# Patient Record
Sex: Male | Born: 1945 | Race: White | Hispanic: No | Marital: Single | Smoking: Never smoker
Health system: Southern US, Community
[De-identification: ages and names within clinical notes are randomized; demographics above are authoritative.]

## PROBLEM LIST (undated history)

## (undated) DIAGNOSIS — E7800 Pure hypercholesterolemia, unspecified: Secondary | ICD-10-CM

## (undated) DIAGNOSIS — E119 Type 2 diabetes mellitus without complications: Secondary | ICD-10-CM

## (undated) DIAGNOSIS — I82629 Acute embolism and thrombosis of deep veins of unspecified upper extremity: Secondary | ICD-10-CM

## (undated) DIAGNOSIS — F32A Depression, unspecified: Secondary | ICD-10-CM

## (undated) DIAGNOSIS — F329 Major depressive disorder, single episode, unspecified: Secondary | ICD-10-CM

## (undated) DIAGNOSIS — D649 Anemia, unspecified: Secondary | ICD-10-CM

## (undated) DIAGNOSIS — I2510 Atherosclerotic heart disease of native coronary artery without angina pectoris: Secondary | ICD-10-CM

## (undated) DIAGNOSIS — I10 Essential (primary) hypertension: Secondary | ICD-10-CM

## (undated) DIAGNOSIS — N400 Enlarged prostate without lower urinary tract symptoms: Secondary | ICD-10-CM

## (undated) DIAGNOSIS — G40909 Epilepsy, unspecified, not intractable, without status epilepticus: Secondary | ICD-10-CM

## (undated) DIAGNOSIS — C61 Malignant neoplasm of prostate: Secondary | ICD-10-CM

## (undated) DIAGNOSIS — C9200 Acute myeloblastic leukemia, not having achieved remission: Secondary | ICD-10-CM

## (undated) DIAGNOSIS — F419 Anxiety disorder, unspecified: Secondary | ICD-10-CM

## (undated) DIAGNOSIS — K219 Gastro-esophageal reflux disease without esophagitis: Secondary | ICD-10-CM

## (undated) HISTORY — DX: Anemia, unspecified: D64.9

## (undated) HISTORY — DX: Acute embolism and thrombosis of deep veins of unspecified upper extremity: I82.629

## (undated) HISTORY — DX: Atherosclerotic heart disease of native coronary artery without angina pectoris: I25.10

## (undated) HISTORY — DX: Gastro-esophageal reflux disease without esophagitis: K21.9

## (undated) HISTORY — DX: Benign prostatic hyperplasia without lower urinary tract symptoms: N40.0

## (undated) HISTORY — DX: Acute myeloblastic leukemia, not having achieved remission: C92.00

## (undated) HISTORY — PX: CARDIAC SURGERY: SHX584

## (undated) HISTORY — DX: Depression, unspecified: F32.A

---
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
## 2002-06-19 DIAGNOSIS — C61 Malignant neoplasm of prostate: Secondary | ICD-10-CM

## 2002-06-19 HISTORY — DX: Malignant neoplasm of prostate: C61

## 2004-08-09 ENCOUNTER — Encounter: Payer: Self-pay | Admitting: Thoracic Surgery

## 2004-08-17 ENCOUNTER — Encounter: Payer: Self-pay | Admitting: Thoracic Surgery

## 2004-09-17 ENCOUNTER — Encounter: Payer: Self-pay | Admitting: Thoracic Surgery

## 2004-10-17 ENCOUNTER — Encounter: Payer: Self-pay | Admitting: Thoracic Surgery

## 2009-04-09 ENCOUNTER — Emergency Department: Payer: Self-pay | Admitting: Unknown Physician Specialty

## 2009-06-17 ENCOUNTER — Encounter: Payer: Self-pay | Admitting: Orthopedic Surgery

## 2009-06-19 ENCOUNTER — Encounter: Payer: Self-pay | Admitting: Orthopedic Surgery

## 2009-07-20 ENCOUNTER — Encounter: Payer: Self-pay | Admitting: Orthopedic Surgery

## 2010-03-23 ENCOUNTER — Emergency Department: Payer: Self-pay | Admitting: Internal Medicine

## 2015-03-30 DIAGNOSIS — R053 Chronic cough: Secondary | ICD-10-CM | POA: Insufficient documentation

## 2015-09-24 DIAGNOSIS — R079 Chest pain, unspecified: Secondary | ICD-10-CM | POA: Insufficient documentation

## 2015-09-25 DIAGNOSIS — M546 Pain in thoracic spine: Secondary | ICD-10-CM | POA: Insufficient documentation

## 2016-01-19 DIAGNOSIS — H1013 Acute atopic conjunctivitis, bilateral: Secondary | ICD-10-CM | POA: Insufficient documentation

## 2016-03-27 DIAGNOSIS — C4491 Basal cell carcinoma of skin, unspecified: Secondary | ICD-10-CM | POA: Insufficient documentation

## 2016-10-03 ENCOUNTER — Encounter: Payer: Self-pay | Admitting: Emergency Medicine

## 2016-10-03 ENCOUNTER — Emergency Department: Payer: Medicare Other

## 2016-10-03 ENCOUNTER — Emergency Department
Admission: EM | Admit: 2016-10-03 | Discharge: 2016-10-03 | Disposition: A | Discharge status: Home / Self Care | Payer: Medicare Other | Attending: Emergency Medicine | Admitting: Emergency Medicine

## 2016-10-03 DIAGNOSIS — W19XXXA Unspecified fall, initial encounter: Secondary | ICD-10-CM

## 2016-10-03 DIAGNOSIS — R51 Headache: Secondary | ICD-10-CM | POA: Diagnosis not present

## 2016-10-03 DIAGNOSIS — W1839XA Other fall on same level, initial encounter: Secondary | ICD-10-CM | POA: Insufficient documentation

## 2016-10-03 DIAGNOSIS — S0990XA Unspecified injury of head, initial encounter: Secondary | ICD-10-CM | POA: Diagnosis present

## 2016-10-03 DIAGNOSIS — Y9389 Activity, other specified: Secondary | ICD-10-CM | POA: Insufficient documentation

## 2016-10-03 DIAGNOSIS — Y999 Unspecified external cause status: Secondary | ICD-10-CM | POA: Insufficient documentation

## 2016-10-03 DIAGNOSIS — I10 Essential (primary) hypertension: Secondary | ICD-10-CM | POA: Insufficient documentation

## 2016-10-03 DIAGNOSIS — Y929 Unspecified place or not applicable: Secondary | ICD-10-CM | POA: Diagnosis not present

## 2016-10-03 DIAGNOSIS — E119 Type 2 diabetes mellitus without complications: Secondary | ICD-10-CM | POA: Diagnosis not present

## 2016-10-03 DIAGNOSIS — M542 Cervicalgia: Secondary | ICD-10-CM | POA: Insufficient documentation

## 2016-10-03 HISTORY — DX: Anxiety disorder, unspecified: F41.9

## 2016-10-03 HISTORY — DX: Type 2 diabetes mellitus without complications: E11.9

## 2016-10-03 HISTORY — DX: Essential (primary) hypertension: I10

## 2016-10-03 HISTORY — DX: Pure hypercholesterolemia, unspecified: E78.00

## 2016-10-03 NOTE — ED Provider Notes (Signed)
[facility] Emergency Department Provider Note  ____________________________________________  Time seen: Approximately 10:59 AM  I have reviewed the triage vital signs and the nursing notes.   HISTORY  Chief Complaint Fall    HPI Mike [name] is a 71 y.o. male presenting to the emergency department with 3 out of 10 frontal headache and neck pain after falling while picking up a newspaper 3 days ago. Patient states that he leaned forward to pick up a newspaper and fell. Patient was unable to get up without the assistance of his sister. Patient denies losing consciousness. He denies blurry vision, nausea, vomiting and confusion. Patient states that neck pain has progressively worsened over the last few days. He states that he has weakness of the left upper extremity from a prior injury years ago. Patient denies radiculopathy and bowel and bladder incontinence. Patient denies a history of blood thinners or stroke. Patient denies fever or recent illness. Patient has tried Tylenol with codeine but has attempted no other alleviating measures.   Past Medical History:  Diagnosis Date  . Anxiety   . Diabetes mellitus without complication ([place])   . High cholesterol   . Hypertension     There are no active problems to display for this patient.   History reviewed. No pertinent surgical history.  Prior to Admission medications   Not on File    Allergies Aleve [naproxen]  History reviewed. No pertinent family history.  Social History Social History  Substance Use Topics  . Smoking status: Never Smoker  . Smokeless tobacco: Never Used  . Alcohol use No     Review of Systems  Constitutional: No fever/chills Eyes: No visual changes. No discharge ENT: No upper respiratory complaints. Cardiovascular: no chest pain. Respiratory: no cough. No SOB. Gastrointestinal: No abdominal pain.  No nausea, no vomiting.  No diarrhea.  No constipation. Genitourinary:  Negative for dysuria. No hematuria Musculoskeletal: Patient has neck pain.  Skin: Negative for rash, abrasions, lacerations, ecchymosis. Neurological: Negative for headaches.   ____________________________________________   PHYSICAL EXAM:  VITAL SIGNS: ED Triage Vitals  Enc Vitals Group     BP 10/03/16 0916 (!) 179/80     Pulse Rate 10/03/16 0916 (!) 58     Resp 10/03/16 0916 18     Temp 10/03/16 0916 97.9 F (36.6 C)     Temp Source 10/03/16 0916 Oral     SpO2 10/03/16 0916 97 %     Weight 10/03/16 0918 190 lb (86.2 kg)     Height 10/03/16 0918 5\' 7"  (1.702 m)     Head Circumference --      Peak Flow --      Pain Score 10/03/16 0922 3     Pain Loc --      Pain Edu? --      Excl. in [place]? --    Constitutional: Alert and oriented. 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 Patient holds his left upper extremity in flexion at the elbow and wrist against his abdomen at baseline. Eyes: Palpebral and bulbar conjunctiva are nonerythematous bilaterally. PERRL. EOMI.  Head: Atraumatic. ENT:      Ears: Tympanic membranes are pearly bilaterally without bloody effusion visualized.       Nose: Nasal septum is midline without evidence of blood or septal hematoma.      Mouth/Throat: Mucous membranes are moist. Uvula is midline. Neck: Full range of motion. No pain with neck flexion. Patient has pain with palpation of the  cervical spine.   Cardiovascular:  Normal rate, regular rhythm. Normal S1 and S2. No murmurs, gallops or rubs auscultated.  Respiratory: Resonant and symmetric percussion tones bilaterally. On auscultation, adventitious sounds are absent.  Gastrointestinal: No areas of visible pulsations or peristalsis. Active bowel sounds audible in all four quadrants.Musculature soft and relaxed to light palpation. No masses or areas of tenderness to deep palpation. No costovertebral angle tenderness bilaterally.  Musculoskeletal: Patient has 5/5 strength in the upper and  lower extremities bilaterally. Full range of motion at the shoulder, elbow and wrist bilaterally. Full range of motion at the hip, knee and ankle bilaterally. No changes in gait. Palpable radial, ulnar and dorsalis pedis pulses bilaterally and symmetrically. Neurologic: Normal speech and language. No gross focal neurologic deficits are appreciated. Cranial nerves: 2-10 normal as tested. Cerebellar: Finger-nose-finger WNL, heel to shin WNL. Sensorimotor: No sensory loss or abnormal reflexes. Vision: No visual field deficts noted to confrontation.  Speech: No dysarthria or expressive aphasia.  Skin:  Skin is warm, dry and intact. No rash or bruising noted.  Psychiatric: Mood and affect are normal for age. Speech and behavior are normal.  ____________________________________________   LABS (all labs ordered are listed, but only abnormal results are displayed)  Labs Reviewed - No data to display ____________________________________________  EKG   ____________________________________________  RADIOLOGY Unk Pinto, personally viewed and evaluated these images as part of my medical decision making, as well as reviewing the written report by the radiologist.    Ct Head Wo Contrast  Result Date: 10/03/2016 CLINICAL DATA:  Fall several days ago with headaches and neck pain, initial encounter EXAM: CT HEAD WITHOUT CONTRAST CT CERVICAL SPINE WITHOUT CONTRAST TECHNIQUE: Multidetector CT imaging of the head and cervical spine was performed following the standard protocol without intravenous contrast. Multiplanar CT image reconstructions of the cervical spine were also generated. COMPARISON:  None. FINDINGS: CT HEAD FINDINGS Brain: Mild atrophic changes are noted. No findings to suggest acute hemorrhage, acute infarction or space-occupying mass lesion are noted. Vascular: No hyperdense vessel or unexpected calcification. Skull: Normal. Negative for fracture or focal lesion. Sinuses/Orbits: No  acute finding. Other: None. CT CERVICAL SPINE FINDINGS Alignment: Within normal limits. Skull base and vertebrae: 7 cervical segments are well visualized. Multilevel osteophytic changes and facet hypertrophic changes are seen. No acute fracture or acute facet abnormality is noted. Ligamentous calcification is noted posteriorly. Soft tissues and spinal canal: Within normal limits. Disc levels: Multilevel osteophytic changes are noted. These are worst at the C5-6 level. Upper chest: Within normal limits. IMPRESSION: CT of the head:  No acute intracranial abnormality noted. CT of the cervical spine: Chronic degenerative changes without acute abnormality. Electronically Signed   By: Inez Catalina M.D.   On: 10/03/2016 11:36   Ct Cervical Spine Wo Contrast  Result Date: 10/03/2016 CLINICAL DATA:  Fall several days ago with headaches and neck pain, initial encounter EXAM: CT HEAD WITHOUT CONTRAST CT CERVICAL SPINE WITHOUT CONTRAST TECHNIQUE: Multidetector CT imaging of the head and cervical spine was performed following the standard protocol without intravenous contrast. Multiplanar CT image reconstructions of the cervical spine were also generated. COMPARISON:  None. FINDINGS: CT HEAD FINDINGS Brain: Mild atrophic changes are noted. No findings to suggest acute hemorrhage, acute infarction or space-occupying mass lesion are noted. Vascular: No hyperdense vessel or unexpected calcification. Skull: Normal. Negative for fracture or focal lesion. Sinuses/Orbits: No acute finding. Other: None. CT CERVICAL SPINE FINDINGS Alignment: Within normal limits. Skull base and vertebrae: 7 cervical  segments are well visualized. Multilevel osteophytic changes and facet hypertrophic changes are seen. No acute fracture or acute facet abnormality is noted. Ligamentous calcification is noted posteriorly. Soft tissues and spinal canal: Within normal limits. Disc levels: Multilevel osteophytic changes are noted. These are worst at the C5-6  level. Upper chest: Within normal limits. IMPRESSION: CT of the head:  No acute intracranial abnormality noted. CT of the cervical spine: Chronic degenerative changes without acute abnormality. Electronically Signed   By: Inez Catalina M.D.   On: 10/03/2016 11:36    ____________________________________________    PROCEDURES  Procedure(s) performed:    Procedures    Medications - No data to display   ____________________________________________   INITIAL IMPRESSION / ASSESSMENT AND PLAN / ED COURSE  Pertinent labs & imaging results that were available during my care of the patient were reviewed by me and considered in my medical decision making (see chart for details).  Review of the [place] CSRS was performed in accordance of the [place] prior to dispensing any controlled drugs.     Assessment and Plan:  Fall, initial encounter:  Patient presents to the emergency department with neck pain and frontal headache after falling while picking up a newspaper 3 days ago. CT head and CT cervical spine revealed no acute abnormality. Physical exam and vital signs are reassuring. Patient was advised to use Tylenol as needed for discomfort. All patient questions were answered.   ____________________________________________  FINAL CLINICAL IMPRESSION(S) / ED DIAGNOSES  Final diagnoses:  Fall, initial encounter      NEW MEDICATIONS STARTED DURING THIS VISIT:  There are no discharge medications for this patient.       This chart was dictated using voice recognition software/Dragon. Despite best efforts to proofread, errors can occur which can change the meaning. Any change was purely unintentional.    Lannie Fields, PA-C 10/03/16 2125    Nena Polio, MD 10/04/16 [phone]

## 2016-10-03 NOTE — ED Triage Notes (Signed)
Pt to ed post fall on Saturday.  Pt states he bent over to pick up the newspaper and fell forward onto the ground after he lost his balance.  Pt states he was supposed to have a cane but was not using it on Saturday.  Pt reports headache today 3/10.

## 2018-01-07 ENCOUNTER — Other Ambulatory Visit: Payer: Self-pay

## 2018-01-07 ENCOUNTER — Emergency Department: Payer: Medicare Other

## 2018-01-07 ENCOUNTER — Emergency Department
Admission: EM | Admit: 2018-01-07 | Discharge: 2018-01-07 | Disposition: A | Discharge status: Home / Self Care | Payer: Medicare Other | Attending: Emergency Medicine | Admitting: Emergency Medicine

## 2018-01-07 DIAGNOSIS — R55 Syncope and collapse: Secondary | ICD-10-CM | POA: Insufficient documentation

## 2018-01-07 DIAGNOSIS — I259 Chronic ischemic heart disease, unspecified: Secondary | ICD-10-CM | POA: Diagnosis not present

## 2018-01-07 DIAGNOSIS — I10 Essential (primary) hypertension: Secondary | ICD-10-CM | POA: Insufficient documentation

## 2018-01-07 DIAGNOSIS — E119 Type 2 diabetes mellitus without complications: Secondary | ICD-10-CM | POA: Diagnosis not present

## 2018-01-07 DIAGNOSIS — R0602 Shortness of breath: Secondary | ICD-10-CM | POA: Diagnosis present

## 2018-01-07 LAB — CBC WITH DIFFERENTIAL/PLATELET
Basophils Absolute: 0.1 10*3/uL (ref 0–0.1)
Basophils Relative: 1 %
Eosinophils Absolute: 0.3 10*3/uL (ref 0–0.7)
Eosinophils Relative: 3 %
HEMATOCRIT: 35.1 % — AB (ref 40.0–52.0)
Hemoglobin: 11.2 g/dL — ABNORMAL LOW (ref 13.0–18.0)
LYMPHS ABS: 1.3 10*3/uL (ref 1.0–3.6)
LYMPHS PCT: 11 %
MCH: 19.8 pg — ABNORMAL LOW (ref 26.0–34.0)
MCHC: 31.8 g/dL — AB (ref 32.0–36.0)
MCV: 62.4 fL — AB (ref 80.0–100.0)
MONO ABS: 1.1 10*3/uL — AB (ref 0.2–1.0)
MONOS PCT: 10 %
NEUTROS ABS: 8.6 10*3/uL — AB (ref 1.4–6.5)
Neutrophils Relative %: 75 %
Platelets: 210 10*3/uL (ref 150–440)
RBC: 5.63 MIL/uL (ref 4.40–5.90)
RDW: 15.1 % — AB (ref 11.5–14.5)
WBC: 11.4 10*3/uL — ABNORMAL HIGH (ref 3.8–10.6)

## 2018-01-07 LAB — TROPONIN I: Troponin I: <0.03 ng/mL (ref <0.03)

## 2018-01-07 LAB — URINALYSIS, COMPLETE (UACMP) WITH MICROSCOPIC
BACTERIA UA: NONE SEEN
Bilirubin Urine: NEGATIVE
Glucose, UA: >=500 mg/dL — AB
Hgb urine dipstick: NEGATIVE
KETONES UR: NEGATIVE mg/dL
Leukocytes, UA: NEGATIVE
NITRITE: NEGATIVE
PH: 8 (ref 5.0–8.0)
Protein, ur: NEGATIVE mg/dL
SPECIFIC GRAVITY, URINE: 1.006 (ref 1.005–1.030)
SQUAMOUS EPITHELIAL / LPF: NONE SEEN (ref 0–5)

## 2018-01-07 LAB — COMPREHENSIVE METABOLIC PANEL
ALT: 16 U/L (ref 0–44)
ANION GAP: 9 (ref 5–15)
AST: 22 U/L (ref 15–41)
Albumin: 4.3 g/dL (ref 3.5–5.0)
Alkaline Phosphatase: 165 U/L — ABNORMAL HIGH (ref 38–126)
BUN: 18 mg/dL (ref 8–23)
CO2: 27 mmol/L (ref 22–32)
Calcium: 9.3 mg/dL (ref 8.9–10.3)
Chloride: 99 mmol/L (ref 98–111)
Creatinine, Ser: 1.19 mg/dL (ref 0.61–1.24)
GFR calc Af Amer: >60 mL/min (ref >60)
GFR calc non Af Amer: 59 mL/min — ABNORMAL LOW (ref >60)
GLUCOSE: 280 mg/dL — AB (ref 70–99)
POTASSIUM: 4.8 mmol/L (ref 3.5–5.1)
SODIUM: 135 mmol/L (ref 135–145)
Total Bilirubin: 0.7 mg/dL (ref 0.3–1.2)
Total Protein: 7.6 g/dL (ref 6.5–8.1)

## 2018-01-07 MED ORDER — SODIUM CHLORIDE 0.9 % IV BOLUS
500.0000 mL | Freq: Once | INTRAVENOUS | Status: AC
  Administered 2018-01-07: 500 mL via INTRAVENOUS

## 2018-01-07 NOTE — ED Triage Notes (Signed)
Pt comes via [facility] from home with c/o SHOB. Pt states he was walking to the mailbox and got SHOB and diaphoretic. Pt states he started back to the house and had trouble. Pt is alert and oriented. Pt had recent MI hx and wanted to get checked out.

## 2018-01-07 NOTE — ED Notes (Signed)
Pt given urinal for urine collection

## 2018-01-07 NOTE — ED Provider Notes (Signed)
[facility] Emergency Department Provider Note ____________________________________________   First MD Initiated Contact with Patient 01/07/18 1513     (approximate)  I have reviewed the triage vital signs and the nursing notes.   HISTORY  Chief Complaint Shortness of Breath    HPI Mike [name] is a 72 y.o. male with PMH as noted below who presents with dizziness, described as being "swimmy headed" acute onset this afternoon when the patient walked outside to check his mail.  He states that he also felt short of breath and had some chest discomfort.  He states that the symptoms have now mostly resolved.  He reports being in his usual state of health until this afternoon.  He denies any other acute symptoms.   Past Medical History:  Diagnosis Date  . Anxiety   . Diabetes mellitus without complication ([place])   . High cholesterol   . Hypertension     There are no active problems to display for this patient.   History reviewed. No pertinent surgical history.  Prior to Admission medications   Not on File    Allergies Aleve [naproxen]  No family history on file.  Social History Social History   Tobacco Use  . Smoking status: Never Smoker  . Smokeless tobacco: Never Used  Substance Use Topics  . Alcohol use: No  . Drug use: No    Review of Systems  Constitutional: No fever. Eyes: No visual changes. ENT: No sore throat. Cardiovascular: Positive for chest discomfort. Respiratory: Positive for shortness of breath. Gastrointestinal: No vomiting or diarrhea. Genitourinary: Negative for dysuria.  Musculoskeletal: Negative for back pain. Skin: Negative for rash. Neurological: Negative for headache.  Positive for lightheadedness.   ____________________________________________   PHYSICAL EXAM:  VITAL SIGNS: ED Triage Vitals  Enc Vitals Group     BP --      Pulse Rate 01/07/18 1509 74     Resp 01/07/18 1509 18     Temp 01/07/18  1509 98.7 F (37.1 C)     Temp Source 01/07/18 1509 Oral     SpO2 01/07/18 1509 99 %     Weight 01/07/18 1510 190 lb (86.2 kg)     Height 01/07/18 1510 5\' 7"  (1.702 m)     Head Circumference --      Peak Flow --      Pain Score 01/07/18 1510 4     Pain Loc --      Pain Edu? --      Excl. in [place]? --     Constitutional: Alert and oriented. Well appearing and in no acute distress. Eyes: Conjunctivae are normal.  EOMI.  PERRLA. Head: Atraumatic. Nose: No congestion/rhinnorhea. Mouth/Throat: Mucous membranes are somewhat dry.   Neck: Normal range of motion.  Cardiovascular: Normal rate, regular rhythm. Grossly normal heart sounds.  Good peripheral circulation. Respiratory: Normal respiratory effort.  No retractions. Lungs CTAB. Gastrointestinal: Soft and nontender. No distention.  Genitourinary: No flank tenderness. Musculoskeletal: No lower extremity edema.  Extremities warm and well perfused.  Neurologic:  Normal speech and language. No gross focal neurologic deficits are appreciated.  Skin:  Skin is warm and dry. No rash noted. Psychiatric: Mood and affect are normal. Speech and behavior are normal.  ____________________________________________   LABS (all labs ordered are listed, but only abnormal results are displayed)  Labs Reviewed  COMPREHENSIVE METABOLIC PANEL - Abnormal; Notable for the following components:      Result Value   Glucose, Bld 280 (*)  Alkaline Phosphatase 165 (*)    GFR calc non Af Amer 59 (*)    All other components within normal limits  CBC WITH DIFFERENTIAL/PLATELET - Abnormal; Notable for the following components:   WBC 11.4 (*)    Hemoglobin 11.2 (*)    HCT 35.1 (*)    MCV 62.4 (*)    MCH 19.8 (*)    MCHC 31.8 (*)    RDW 15.1 (*)    Neutro Abs 8.6 (*)    Monocytes Absolute 1.1 (*)    All other components within normal limits  URINALYSIS, COMPLETE (UACMP) WITH MICROSCOPIC - Abnormal; Notable for the following components:   Color, Urine  STRAW (*)    APPearance CLEAR (*)    Glucose, UA >=500 (*)    All other components within normal limits  TROPONIN I   ____________________________________________  EKG  ED ECG REPORT I, Arta Silence, the attending physician, personally viewed and interpreted this ECG.  Date: 01/07/2018 EKG Time: 1507 Rate: 73 Rhythm: normal sinus rhythm QRS Axis: normal Intervals: normal ST/T Wave abnormalities: Possible <27mm ST depression anterior and lateral Narrative Interpretation: no evidence of acute ischemia; no significant change when compared to EKG from 2011 which has similar morphology  ____________________________________________  RADIOLOGY  CXR: No focal infiltrate or other acute findings  ____________________________________________   PROCEDURES  Procedure(s) performed: No  Procedures  Critical Care performed: No ____________________________________________   INITIAL IMPRESSION / ASSESSMENT AND PLAN / ED COURSE  Pertinent labs & imaging results that were available during my care of the patient were reviewed by me and considered in my medical decision making (see chart for details).  72 year old male with PMH as noted above including diabetes and CAD presents with acute onset of dizziness, shortness of breath, and chest discomfort after he walked outside to check his mail in the heat.  He states he now feels better.  He was in his usual state of health until this afternoon.  On exam, the patient is comfortable appearing, actively talking on the phone, and the remainder of his exam is as described above.  Vital signs are normal.    Differential includes vasovagal episode, dehydration, electrolyte abnormality, less likely ACS.  We will obtain basic labs, troponins, chest x-ray, give fluids and reassess.  ----------------------------------------- 5:07 PM on 01/07/2018 -----------------------------------------  The patient's ED work-up has been entirely negative.   The patient's blood glucose is slightly elevated, but there is no evidence of DKA or other complication.  UA is clear.  The patient's troponin is negative.  His vital signs have remained stable.  He states that he feels totally back to normal at this time, and would like to go home.  Overall I suspect most likely vasovagal near syncope or mild dehydration related to the heat.  I counseled the patient on the results of the work-up.  Return precautions given, and he expresses understanding.  ____________________________________________   FINAL CLINICAL IMPRESSION(S) / ED DIAGNOSES  Final diagnoses:  Near syncope      NEW MEDICATIONS STARTED DURING THIS VISIT:  New Prescriptions   No medications on file     Note:  This document was prepared using Dragon voice recognition software and may include unintentional dictation errors.    Arta Silence, MD 01/07/18 [phone]

## 2018-01-07 NOTE — Discharge Instructions (Addendum)
Stay out of the heat, make sure you are eating and drinking regularly, and take your regular medications as prescribed.  Return to the ER for new, worsening, or persistent dizziness, shortness of breath, weakness, fevers, abdominal pain, or any other new or worsening symptoms that concern you.

## 2018-06-05 ENCOUNTER — Emergency Department: Payer: Medicare Other

## 2018-06-05 ENCOUNTER — Other Ambulatory Visit: Payer: Self-pay

## 2018-06-05 ENCOUNTER — Emergency Department
Admission: EM | Admit: 2018-06-05 | Discharge: 2018-06-05 | Disposition: A | Discharge status: Home / Self Care | Payer: Medicare Other | Attending: Emergency Medicine | Admitting: Emergency Medicine

## 2018-06-05 ENCOUNTER — Encounter: Payer: Self-pay | Admitting: *Deleted

## 2018-06-05 DIAGNOSIS — R7309 Other abnormal glucose: Secondary | ICD-10-CM | POA: Diagnosis not present

## 2018-06-05 DIAGNOSIS — E119 Type 2 diabetes mellitus without complications: Secondary | ICD-10-CM | POA: Insufficient documentation

## 2018-06-05 DIAGNOSIS — Z79899 Other long term (current) drug therapy: Secondary | ICD-10-CM | POA: Diagnosis not present

## 2018-06-05 DIAGNOSIS — M25511 Pain in right shoulder: Secondary | ICD-10-CM | POA: Diagnosis present

## 2018-06-05 DIAGNOSIS — L03115 Cellulitis of right lower limb: Secondary | ICD-10-CM | POA: Insufficient documentation

## 2018-06-05 DIAGNOSIS — I10 Essential (primary) hypertension: Secondary | ICD-10-CM | POA: Insufficient documentation

## 2018-06-05 DIAGNOSIS — R739 Hyperglycemia, unspecified: Secondary | ICD-10-CM

## 2018-06-05 LAB — CBC WITH DIFFERENTIAL/PLATELET
Abs Immature Granulocytes: 0.1 10*3/uL — ABNORMAL HIGH (ref 0.00–0.07)
BASOS ABS: 0.1 10*3/uL (ref 0.0–0.1)
Basophils Relative: 0 %
EOS PCT: 0 %
Eosinophils Absolute: 0.1 10*3/uL (ref 0.0–0.5)
HEMATOCRIT: 34.3 % — AB (ref 39.0–52.0)
Hemoglobin: 10.4 g/dL — ABNORMAL LOW (ref 13.0–17.0)
IMMATURE GRANULOCYTES: 1 %
LYMPHS ABS: 1.5 10*3/uL (ref 0.7–4.0)
LYMPHS PCT: 11 %
MCH: 19.6 pg — ABNORMAL LOW (ref 26.0–34.0)
MCHC: 30.3 g/dL (ref 30.0–36.0)
MCV: 64.6 fL — AB (ref 80.0–100.0)
Monocytes Absolute: 1 10*3/uL (ref 0.1–1.0)
Monocytes Relative: 7 %
NEUTROS ABS: 11.7 10*3/uL — AB (ref 1.7–7.7)
NEUTROS PCT: 81 %
NRBC: 0.3 % — AB (ref 0.0–0.2)
PLATELETS: 211 10*3/uL (ref 150–400)
RBC: 5.31 MIL/uL (ref 4.22–5.81)
RDW: 16.6 % — AB (ref 11.5–15.5)
Smear Review: NORMAL
WBC: 14.5 10*3/uL — AB (ref 4.0–10.5)

## 2018-06-05 LAB — BASIC METABOLIC PANEL
Anion gap: 11 (ref 5–15)
BUN: 11 mg/dL (ref 8–23)
CALCIUM: 9.2 mg/dL (ref 8.9–10.3)
CO2: 25 mmol/L (ref 22–32)
Chloride: 98 mmol/L (ref 98–111)
Creatinine, Ser: 1 mg/dL (ref 0.61–1.24)
Glucose, Bld: 340 mg/dL — ABNORMAL HIGH (ref 70–99)
Potassium: 3.8 mmol/L (ref 3.5–5.1)
Sodium: 134 mmol/L — ABNORMAL LOW (ref 135–145)

## 2018-06-05 LAB — SEDIMENTATION RATE: Sed Rate: 44 mm/hr — ABNORMAL HIGH (ref 0–20)

## 2018-06-05 LAB — URIC ACID: Uric Acid, Serum: 5.6 mg/dL (ref 3.7–8.6)

## 2018-06-05 LAB — GLUCOSE, CAPILLARY: Glucose-Capillary: 270 mg/dL — ABNORMAL HIGH (ref 70–99)

## 2018-06-05 MED ORDER — SODIUM CHLORIDE 0.9 % IV BOLUS
1000.0000 mL | Freq: Once | INTRAVENOUS | Status: AC
  Administered 2018-06-05: 1000 mL via INTRAVENOUS

## 2018-06-05 MED ORDER — CLINDAMYCIN HCL 300 MG PO CAPS: 300.0000 mg | ORAL_CAPSULE | Freq: Three times a day (TID) | ORAL | 0 refills | Status: DC

## 2018-06-05 MED ORDER — CLINDAMYCIN PHOSPHATE 600 MG/50ML IV SOLN
600.0000 mg | Freq: Once | INTRAVENOUS | Status: AC
  Administered 2018-06-05: 600 mg via INTRAVENOUS
  Filled 2018-06-05: qty 50

## 2018-06-05 NOTE — ED Provider Notes (Signed)
[facility] Emergency Department Provider Note   ____________________________________________   First MD Initiated Contact with Patient 06/05/18 1317     (approximate)  I have reviewed the triage vital signs and the nursing notes.   HISTORY  Chief Complaint Hand Injury    HPI Mike [name] is a 72 y.o. male patient presents with right shoulder, right hand, right tib-fib, and right foot pain secondary to a fall last night.  Patient denies loss sensation or   loss of function of affected extremities.  Patient rates pain as a 5/10.  Patient described pain is "achy".  No palliative measure for complaint.  Past Medical History:  Diagnosis Date  . Anxiety   . Diabetes mellitus without complication ([place])   . High cholesterol   . Hypertension     There are no active problems to display for this patient.   History reviewed. No pertinent surgical history.  Prior to Admission medications   Medication Sig Start Date End Date Taking? Authorizing Provider  ALPRAZolam Duanne Moron) 0.5 MG tablet Take 0.5 mg by mouth at bedtime as needed for anxiety.   Yes [provider]  atenolol (TENORMIN) 50 MG tablet Take 50 mg by mouth daily.   Yes [provider]  atorvastatin (LIPITOR) 20 MG tablet Take 20 mg by mouth daily.   Yes [provider]  gabapentin (NEURONTIN) 300 MG capsule Take 300 mg by mouth daily.   Yes [provider]  gabapentin (NEURONTIN) 300 MG capsule Take 1,200 mg by mouth at bedtime.   Yes [provider]  glimepiride (AMARYL) 4 MG tablet Take 4 mg by mouth daily with breakfast.   Yes [provider]  irbesartan (AVAPRO) 150 MG tablet Take 150 mg by mouth daily.   Yes [provider]  clindamycin (CLEOCIN) 300 MG capsule Take 1 capsule (300 mg total) by mouth 3 (three) times daily for 10 days. 06/05/18 06/15/18  Sable Feil, PA-C    Allergies Aleve [naproxen]  History reviewed. No  pertinent family history.  Social History Social History   Tobacco Use  . Smoking status: Never Smoker  . Smokeless tobacco: Never Used  Substance Use Topics  . Alcohol use: No  . Drug use: No    Review of Systems Constitutional: No fever/chills Eyes: No visual changes. ENT: No sore throat. Cardiovascular: Denies chest pain. Respiratory: Denies shortness of breath. Gastrointestinal: No abdominal pain.  No nausea, no vomiting.  No diarrhea.  No constipation. Genitourinary: Negative for dysuria. Musculoskeletal: Negative for back pain. Skin: Negative for rash. Neurological: Negative for headaches, focal weakness or numbness. Psychiatric:Anxiety Endocrine:Diabetes, hyperlipidemia, hypertension.   Allergic/Immunilogical: Naproxen ____________________________________________   PHYSICAL EXAM:  VITAL SIGNS: ED Triage Vitals  Enc Vitals Group     BP 06/05/18 1309 (!) 179/70     Pulse Rate 06/05/18 1309 77     Resp 06/05/18 1309 16     Temp 06/05/18 1309 98.4 F (36.9 C)     Temp Source 06/05/18 1309 Oral     SpO2 06/05/18 1309 100 %     Weight 06/05/18 1306 190 lb 0.6 oz (86.2 kg)     Height --      Head Circumference --      Peak Flow --      Pain Score 06/05/18 1306 5     Pain Loc --      Pain Edu? --      Excl. in [place]? --  Constitutional: Alert and oriented. Well appearing and in no acute distress. Cardiovascular: Normal rate, regular rhythm. Grossly normal heart sounds.  Good peripheral circulation.  Elevated blood pressure. Respiratory: Normal respiratory effort.  No retractions. Lungs CTAB. Gastrointestinal: Soft and nontender. No distention. No abdominal bruits. No CVA tenderness. Musculoskeletal: Right hand and right foot edema.  No obvious deformity to the right shoulder and right leg.  Patient has moderate guarding palpation of the [place] joint.  Patient also has moderate guarding palpation midshaft of the tibia.   Neurologic:  Normal speech and language. No  gross focal neurologic deficits are appreciated. No gait instability. Skin:  Skin is warm, dry and intact. No rash noted.  Abrasion anterior right lower leg. Psychiatric: Mood and affect are normal. Speech and behavior are normal.  ____________________________________________   LABS (all labs ordered are listed, but only abnormal results are displayed)  Labs Reviewed  CBC WITH DIFFERENTIAL/PLATELET - Abnormal; Notable for the following components:      Result Value   WBC 14.5 (*)    Hemoglobin 10.4 (*)    HCT 34.3 (*)    MCV 64.6 (*)    MCH 19.6 (*)    RDW 16.6 (*)    nRBC 0.3 (*)    Neutro Abs 11.7 (*)    Abs Immature Granulocytes 0.10 (*)    All other components within normal limits  BASIC METABOLIC PANEL - Abnormal; Notable for the following components:   Sodium 134 (*)    Glucose, Bld 340 (*)    All other components within normal limits  SEDIMENTATION RATE - Abnormal; Notable for the following components:   Sed Rate 44 (*)    All other components within normal limits  URIC ACID   ____________________________________________  EKG   ____________________________________________  RADIOLOGY  ED MD interpretation:    Official radiology report(s): Dg Shoulder Right  Result Date: 06/05/2018 CLINICAL DATA:  Mechanical fall. Pain. EXAM: RIGHT SHOULDER - 2+ VIEW COMPARISON:  None. FINDINGS: No glenohumeral fracture or dislocation. There may be mild narrowing of the glenohumeral joint. There is degenerative change at the acromioclavicular joint with bony overgrowth and joint space narrowing. No biceps tendon calcification. Adjacent ribs are intact. IMPRESSION: No acute fracture or dislocation. Electronically Signed   By: Staci Righter M.D.   On: 06/05/2018 14:20   Dg Tibia/fibula Right  Result Date: 06/05/2018 CLINICAL DATA:  Fall. Pain. EXAM: RIGHT TIBIA AND FIBULA - 2 VIEW COMPARISON:  None. FINDINGS: There is no evidence of fracture or other acute/worrisome bone lesions.  Soft tissues are unremarkable. Traction spurring of the tibial tuberosity at the quadriceps tendon attachment. IMPRESSION: No acute findings. Electronically Signed   By: Staci Righter M.D.   On: 06/05/2018 14:22   Dg Hand Complete Right  Result Date: 06/05/2018 CLINICAL DATA:  Injury with swelling and decreased movement. Fall last night EXAM: RIGHT HAND - COMPLETE 3+ VIEW COMPARISON:  None. FINDINGS: Negative for fracture Mild degenerative change in the distal radioulnar joint and in the radiocarpal joint with joint space narrowing and spurring. No periarticular erosion. IMPRESSION: Negative for fracture. Electronically Signed   By: Franchot Gallo M.D.   On: 06/05/2018 13:41   Dg Foot Complete Right  Result Date: 06/05/2018 CLINICAL DATA:  Fall. RIGHT foot pain. EXAM: RIGHT FOOT COMPLETE - 3+ VIEW COMPARISON:  None. FINDINGS: There is no evidence of fracture or dislocation. There is no evidence of erosive arthropathy or other focal bone abnormality. Midfoot degenerative change. Soft tissues are swollen. IMPRESSION:  No acute osseous injury of the right foot. Electronically Signed   By: Staci Righter M.D.   On: 06/05/2018 14:23    ____________________________________________   PROCEDURES  Procedure(s) performed: None  Procedures  Critical Care performed: No  ____________________________________________   INITIAL IMPRESSION / ASSESSMENT AND PLAN / ED COURSE  As part of my medical decision making, I reviewed the following data within the [place]    Patient presents for multiple joint pain on the right side secondary to a fall. Last night.  Left foot  is edematous and erythematous with multiple abrasions.  Patient glucose elevated at 340.  Patient WBC is elevated at 14.5.  Patient sed rate is 44.  Patient will be given a liter of fluids and glucose will be rechecked.  Patient given IV clindamycin and prescription for home oral medication.  Discussed patient with  Dr.Siadecki. Discussed no acute x-ray findings of the right upper and lower extremities.  Patient advised to follow-up with PCP to discuss glucose control.  Continue home medications.  Start antibiotics tomorrow.  Continue previous medications.  ____________________________________________   FINAL CLINICAL IMPRESSION(S) / ED DIAGNOSES  Final diagnoses:  Cellulitis of right foot  Elevated serum glucose     ED Discharge Orders         Ordered    clindamycin (CLEOCIN) 300 MG capsule  3 times daily     06/05/18 1623           Note:  This document was prepared using Dragon voice recognition software and may include unintentional dictation errors.    Sable Feil, PA-C 06/05/18 1632    Arta Silence, MD 06/05/18 2114

## 2018-06-05 NOTE — Discharge Instructions (Signed)
Advised to follow-up with Dr. Caryl Comes to discuss glucose control.

## 2018-06-05 NOTE — ED Notes (Addendum)
Pt fell last night- unable to tell what he was doing or why he fell.  Pt on blood thinners but did not hit head Pt does not want ice pack

## 2018-06-05 NOTE — ED Triage Notes (Signed)
Pt to ED after a mechanical fall last night with injury to the right hand. Swelling and pain noted this morning with decreased mobility.

## 2018-06-13 ENCOUNTER — Inpatient Hospital Stay
Admission: AD | Admit: 2018-06-13 | Discharge: 2018-06-17 | DRG: 623 | Disposition: A | Discharge status: Skilled Nursing Facility | Payer: Medicare Other | Source: Ambulatory Visit | Attending: Internal Medicine | Admitting: Internal Medicine

## 2018-06-13 ENCOUNTER — Other Ambulatory Visit: Payer: Self-pay

## 2018-06-13 ENCOUNTER — Inpatient Hospital Stay: Payer: Medicare Other

## 2018-06-13 ENCOUNTER — Encounter: Payer: Self-pay | Admitting: *Deleted

## 2018-06-13 DIAGNOSIS — G40909 Epilepsy, unspecified, not intractable, without status epilepticus: Secondary | ICD-10-CM | POA: Diagnosis present

## 2018-06-13 DIAGNOSIS — E1140 Type 2 diabetes mellitus with diabetic neuropathy, unspecified: Secondary | ICD-10-CM | POA: Diagnosis present

## 2018-06-13 DIAGNOSIS — L03115 Cellulitis of right lower limb: Secondary | ICD-10-CM | POA: Diagnosis present

## 2018-06-13 DIAGNOSIS — Z886 Allergy status to analgesic agent status: Secondary | ICD-10-CM | POA: Diagnosis not present

## 2018-06-13 DIAGNOSIS — F329 Major depressive disorder, single episode, unspecified: Secondary | ICD-10-CM | POA: Diagnosis present

## 2018-06-13 DIAGNOSIS — F419 Anxiety disorder, unspecified: Secondary | ICD-10-CM | POA: Diagnosis present

## 2018-06-13 DIAGNOSIS — L02619 Cutaneous abscess of unspecified foot: Secondary | ICD-10-CM

## 2018-06-13 DIAGNOSIS — L02611 Cutaneous abscess of right foot: Secondary | ICD-10-CM | POA: Diagnosis present

## 2018-06-13 DIAGNOSIS — I10 Essential (primary) hypertension: Secondary | ICD-10-CM | POA: Diagnosis present

## 2018-06-13 DIAGNOSIS — D649 Anemia, unspecified: Secondary | ICD-10-CM | POA: Diagnosis present

## 2018-06-13 DIAGNOSIS — I2510 Atherosclerotic heart disease of native coronary artery without angina pectoris: Secondary | ICD-10-CM | POA: Diagnosis present

## 2018-06-13 DIAGNOSIS — Z8546 Personal history of malignant neoplasm of prostate: Secondary | ICD-10-CM | POA: Diagnosis not present

## 2018-06-13 DIAGNOSIS — Z951 Presence of aortocoronary bypass graft: Secondary | ICD-10-CM

## 2018-06-13 DIAGNOSIS — E7800 Pure hypercholesterolemia, unspecified: Secondary | ICD-10-CM | POA: Diagnosis present

## 2018-06-13 DIAGNOSIS — E11628 Type 2 diabetes mellitus with other skin complications: Principal | ICD-10-CM | POA: Diagnosis present

## 2018-06-13 DIAGNOSIS — E876 Hypokalemia: Secondary | ICD-10-CM | POA: Diagnosis not present

## 2018-06-13 DIAGNOSIS — E11621 Type 2 diabetes mellitus with foot ulcer: Secondary | ICD-10-CM | POA: Diagnosis present

## 2018-06-13 DIAGNOSIS — L97519 Non-pressure chronic ulcer of other part of right foot with unspecified severity: Secondary | ICD-10-CM | POA: Diagnosis present

## 2018-06-13 DIAGNOSIS — Z79899 Other long term (current) drug therapy: Secondary | ICD-10-CM

## 2018-06-13 DIAGNOSIS — Z7984 Long term (current) use of oral hypoglycemic drugs: Secondary | ICD-10-CM | POA: Diagnosis not present

## 2018-06-13 DIAGNOSIS — L03119 Cellulitis of unspecified part of limb: Secondary | ICD-10-CM

## 2018-06-13 HISTORY — DX: Malignant neoplasm of prostate: C61

## 2018-06-13 HISTORY — DX: Epilepsy, unspecified, not intractable, without status epilepticus: G40.909

## 2018-06-13 LAB — CBC
HEMATOCRIT: 30.4 % — AB (ref 39.0–52.0)
HEMOGLOBIN: 9.2 g/dL — AB (ref 13.0–17.0)
MCH: 19.3 pg — ABNORMAL LOW (ref 26.0–34.0)
MCHC: 30.3 g/dL (ref 30.0–36.0)
MCV: 63.7 fL — ABNORMAL LOW (ref 80.0–100.0)
Platelets: 313 10*3/uL (ref 150–400)
RBC: 4.77 MIL/uL (ref 4.22–5.81)
RDW: 15.5 % (ref 11.5–15.5)
WBC: 8.9 10*3/uL (ref 4.0–10.5)
nRBC: 0 % (ref 0.0–0.2)

## 2018-06-13 LAB — BASIC METABOLIC PANEL
Anion gap: 9 (ref 5–15)
BUN: 7 mg/dL — ABNORMAL LOW (ref 8–23)
CO2: 26 mmol/L (ref 22–32)
Calcium: 8.6 mg/dL — ABNORMAL LOW (ref 8.9–10.3)
Chloride: 102 mmol/L (ref 98–111)
Creatinine, Ser: 0.82 mg/dL (ref 0.61–1.24)
GFR calc Af Amer: >60 mL/min (ref >60)
GFR calc non Af Amer: >60 mL/min (ref >60)
Glucose, Bld: 306 mg/dL — ABNORMAL HIGH (ref 70–99)
Potassium: 3.6 mmol/L (ref 3.5–5.1)
Sodium: 137 mmol/L (ref 135–145)

## 2018-06-13 LAB — GLUCOSE, CAPILLARY
Glucose-Capillary: 219 mg/dL — ABNORMAL HIGH (ref 70–99)
Glucose-Capillary: 182 mg/dL — ABNORMAL HIGH (ref 70–99)

## 2018-06-13 LAB — SURGICAL PCR SCREEN
MRSA, PCR: NEGATIVE
STAPHYLOCOCCUS AUREUS: NEGATIVE

## 2018-06-13 MED ORDER — ENOXAPARIN SODIUM 40 MG/0.4ML ~~LOC~~ SOLN
40.0000 mg | SUBCUTANEOUS | Status: DC
  Administered 2018-06-15 – 2018-06-16 (×2): 40 mg via SUBCUTANEOUS
  Filled 2018-06-13 (×2): qty 0.4

## 2018-06-13 MED ORDER — ATORVASTATIN CALCIUM 20 MG PO TABS
20.0000 mg | ORAL_TABLET | Freq: Every evening | ORAL | Status: DC
  Administered 2018-06-13 – 2018-06-16 (×4): 20 mg via ORAL
  Filled 2018-06-13 (×4): qty 1

## 2018-06-13 MED ORDER — MUPIROCIN 2 % EX OINT
1.0000 "application " | TOPICAL_OINTMENT | Freq: Two times a day (BID) | CUTANEOUS | Status: DC
  Filled 2018-06-13: qty 22

## 2018-06-13 MED ORDER — ACETAMINOPHEN 650 MG RE SUPP: 650.0000 mg | Freq: Four times a day (QID) | RECTAL | Status: DC | PRN

## 2018-06-13 MED ORDER — VANCOMYCIN HCL 10 G IV SOLR
1250.0000 mg | Freq: Two times a day (BID) | INTRAVENOUS | Status: DC
  Administered 2018-06-13 – 2018-06-14 (×3): 1250 mg via INTRAVENOUS
  Filled 2018-06-13 (×5): qty 1250

## 2018-06-13 MED ORDER — GABAPENTIN 400 MG PO CAPS
1200.0000 mg | ORAL_CAPSULE | Freq: Every day | ORAL | Status: DC
  Administered 2018-06-13 – 2018-06-16 (×4): 1200 mg via ORAL
  Filled 2018-06-13 (×4): qty 3

## 2018-06-13 MED ORDER — TRAZODONE HCL 50 MG PO TABS: 25.0000 mg | ORAL_TABLET | Freq: Every evening | ORAL | Status: DC | PRN

## 2018-06-13 MED ORDER — METOPROLOL SUCCINATE ER 50 MG PO TB24
50.0000 mg | ORAL_TABLET | Freq: Every day | ORAL | Status: DC
  Administered 2018-06-14 – 2018-06-17 (×4): 50 mg via ORAL
  Filled 2018-06-13 (×5): qty 1

## 2018-06-13 MED ORDER — ATENOLOL 25 MG PO TABS: 50.0000 mg | ORAL_TABLET | Freq: Every day | ORAL | Status: DC

## 2018-06-13 MED ORDER — PAROXETINE HCL 20 MG PO TABS
20.0000 mg | ORAL_TABLET | Freq: Every day | ORAL | Status: DC
  Administered 2018-06-14 – 2018-06-17 (×4): 20 mg via ORAL
  Filled 2018-06-13 (×5): qty 1

## 2018-06-13 MED ORDER — ACETAMINOPHEN 325 MG PO TABS: 650.0000 mg | ORAL_TABLET | Freq: Four times a day (QID) | ORAL | Status: DC | PRN

## 2018-06-13 MED ORDER — PIPERACILLIN-TAZOBACTAM 3.375 G IVPB: 3.3750 g | Freq: Once | INTRAVENOUS | Status: DC

## 2018-06-13 MED ORDER — BISACODYL 5 MG PO TBEC: 5.0000 mg | DELAYED_RELEASE_TABLET | Freq: Every day | ORAL | Status: DC | PRN

## 2018-06-13 MED ORDER — ONDANSETRON HCL 4 MG/2ML IJ SOLN
4.0000 mg | Freq: Four times a day (QID) | INTRAMUSCULAR | Status: DC | PRN
  Administered 2018-06-14: 4 mg via INTRAVENOUS

## 2018-06-13 MED ORDER — IRBESARTAN 150 MG PO TABS
150.0000 mg | ORAL_TABLET | Freq: Every day | ORAL | Status: DC
  Administered 2018-06-13 – 2018-06-17 (×5): 150 mg via ORAL
  Filled 2018-06-13 (×5): qty 1

## 2018-06-13 MED ORDER — SODIUM CHLORIDE 0.9 % IV SOLN
INTRAVENOUS | Status: DC
  Administered 2018-06-13 – 2018-06-15 (×3): via INTRAVENOUS

## 2018-06-13 MED ORDER — VANCOMYCIN HCL IN DEXTROSE 1-5 GM/200ML-% IV SOLN
1000.0000 mg | Freq: Once | INTRAVENOUS | Status: AC
  Administered 2018-06-13: 1000 mg via INTRAVENOUS
  Filled 2018-06-13: qty 200

## 2018-06-13 MED ORDER — GABAPENTIN 300 MG PO CAPS
300.0000 mg | ORAL_CAPSULE | Freq: Every day | ORAL | Status: DC
  Administered 2018-06-13 – 2018-06-17 (×5): 300 mg via ORAL
  Filled 2018-06-13 (×5): qty 1

## 2018-06-13 MED ORDER — PIPERACILLIN-TAZOBACTAM 3.375 G IVPB
3.3750 g | Freq: Three times a day (TID) | INTRAVENOUS | Status: DC
  Administered 2018-06-13 – 2018-06-15 (×6): 3.375 g via INTRAVENOUS
  Filled 2018-06-13 (×6): qty 50

## 2018-06-13 MED ORDER — PHENOBARBITAL 32.4 MG PO TABS
64.8000 mg | ORAL_TABLET | Freq: Three times a day (TID) | ORAL | Status: DC
  Administered 2018-06-13 – 2018-06-17 (×12): 64.8 mg via ORAL
  Filled 2018-06-13 (×12): qty 2

## 2018-06-13 MED ORDER — DOCUSATE SODIUM 100 MG PO CAPS
100.0000 mg | ORAL_CAPSULE | Freq: Two times a day (BID) | ORAL | Status: DC
  Administered 2018-06-13 – 2018-06-17 (×6): 100 mg via ORAL
  Filled 2018-06-13 (×6): qty 1

## 2018-06-13 MED ORDER — INSULIN ASPART 100 UNIT/ML ~~LOC~~ SOLN
0.0000 [IU] | Freq: Three times a day (TID) | SUBCUTANEOUS | Status: DC
  Administered 2018-06-13 – 2018-06-14 (×2): 3 [IU] via SUBCUTANEOUS
  Administered 2018-06-14: 2 [IU] via SUBCUTANEOUS
  Administered 2018-06-15 (×2): 1 [IU] via SUBCUTANEOUS
  Administered 2018-06-15 – 2018-06-16 (×2): 3 [IU] via SUBCUTANEOUS
  Administered 2018-06-16 – 2018-06-17 (×2): 2 [IU] via SUBCUTANEOUS
  Filled 2018-06-13 (×8): qty 1

## 2018-06-13 MED ORDER — ONDANSETRON HCL 4 MG PO TABS: 4.0000 mg | ORAL_TABLET | Freq: Four times a day (QID) | ORAL | Status: DC | PRN

## 2018-06-13 MED ORDER — ALPRAZOLAM 0.5 MG PO TABS
0.5000 mg | ORAL_TABLET | Freq: Every evening | ORAL | Status: DC | PRN
  Administered 2018-06-13: 0.5 mg via ORAL
  Filled 2018-06-13: qty 1

## 2018-06-13 NOTE — Consult Note (Signed)
Reason for Consult: Cellulitis with abscess right foot Referring Physician: Fredderick Swanger is an 72 y.o. male.  HPI: This is a 72 year old male with history of a recent fall.  He was seen in the emergency department late last week and evaluated.  No evidence of any fracture in the foot but he was noted to have some redness and swelling and was placed on clindamycin.  Was seen by his primary care doctor today and decision was made for admission with IV antibiotics and debridement.  Past Medical History:  Diagnosis Date  . Anxiety   . Diabetes mellitus without complication ([employer])   . Epilepsy ([place])   . High cholesterol   . Hypertension   . Prostate cancer [facility]) 2004   prostate removed    History reviewed. No pertinent surgical history.  History reviewed. No pertinent family history.  Social History:  reports that he has never smoked. He has never used smokeless tobacco. He reports that he does not drink alcohol or use drugs.  Allergies:  Allergies  Allergen Reactions  . Aleve [Naproxen]   . Motrin [Ibuprofen] Nausea Only    Medications:  Scheduled: . atorvastatin  20 mg Oral QPM  . docusate sodium  100 mg Oral BID  . enoxaparin (LOVENOX) injection  40 mg Subcutaneous Q24H  . gabapentin  1,200 mg Oral QHS  . gabapentin  300 mg Oral Daily  . insulin aspart  0-9 Units Subcutaneous TID WC  . irbesartan  150 mg Oral Daily  . metoprolol succinate  50 mg Oral Daily  . PARoxetine  20 mg Oral Daily  . phenobarbital  64.8 mg Oral TID    Results for orders placed or performed during the hospital encounter of 06/13/18 (from the past 48 hour(s))  Surgical PCR screen     Status: None   Collection Time: 06/13/18 12:38 PM  Result Value Ref Range   MRSA, PCR NEGATIVE NEGATIVE   Staphylococcus aureus NEGATIVE NEGATIVE    Comment: (NOTE) The Xpert SA Assay (FDA approved for NASAL specimens in patients 2 years of age and older), is one component of a comprehensive surveillance  program. It is not intended to diagnose infection nor to guide or monitor treatment. Performed at [facility], [address] [place], [place] [address]   CBC     Status: Abnormal   Collection Time: 06/13/18 12:43 PM  Result Value Ref Range   WBC 8.9 4.0 - 10.5 K/uL   RBC 4.77 4.22 - 5.81 MIL/uL   Hemoglobin 9.2 (L) 13.0 - 17.0 g/dL   HCT 30.4 (L) 39.0 - 52.0 %   MCV 63.7 (L) 80.0 - 100.0 fL   MCH 19.3 (L) 26.0 - 34.0 pg   MCHC 30.3 30.0 - 36.0 g/dL   RDW 15.5 11.5 - 15.5 %   Platelets 313 150 - 400 K/uL   nRBC 0.0 0.0 - 0.2 %    Comment: Performed at [facility], [address] [place], [place] [address]  Basic metabolic panel     Status: Abnormal   Collection Time: 06/13/18 12:43 PM  Result Value Ref Range   Sodium 137 135 - 145 mmol/L   Potassium 3.6 3.5 - 5.1 mmol/L   Chloride 102 98 - 111 mmol/L   CO2 26 22 - 32 mmol/L   Glucose, Bld 306 (H) 70 - 99 mg/dL   BUN 7 (L) 8 - 23 mg/dL   Creatinine, Ser 0.82 0.61 - 1.24 mg/dL  Calcium 8.6 (L) 8.9 - 10.3 mg/dL   GFR calc non Af Amer >60 >60 mL/min   GFR calc Af Amer >60 >60 mL/min   Anion gap 9 5 - 15    Comment: Performed at [facility], [address], [place], [place] [address]    Mr Foot Right Wo Contrast  Result Date: 06/13/2018 CLINICAL DATA:  Worsening right foot infection despite antibiotics started 8 days ago for cellulitis. History of diabetes and neuropathy. Question osteomyelitis. EXAM: MRI OF THE RIGHT FOREFOOT WITHOUT CONTRAST TECHNIQUE: Multiplanar, multisequence MR imaging of the right forefoot was performed. No intravenous contrast was administered. COMPARISON:  Radiographs 06/05/2018. FINDINGS: Bones/Joint/Cartilage No findings highly suspicious for osteomyelitis or septic joint. There are mild degenerative changes of the 1st metatarsophalangeal joint with subchondral cyst formation medially in the 1st metatarsal head. There is mild edema in the tibial sesamoid. No suspicious  T1 signal abnormality or cortical destruction. No significant joint effusions. Ligaments The Lisfranc ligament is intact. Muscles and Tendons Intact forefoot tendons without significant tenosynovitis. No focal muscular abnormalities. 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 Soft tissues Moderate dorsal forefoot subcutaneous edema without focal fluid collection. There appears to be some skin ulceration along the plantar aspect of the 1st metatarsophalangeal joint, suggesting an open wound. There is edema and ill-defined fluid within the underlying subcutaneous fat. Largest fluid component measures approximately 8 mm on image 21/6. No well-defined fluid collections identified on noncontrast imaging. IMPRESSION: 1. Suspected wound involving the plantar aspect of the medial forefoot with underlying inflammation and possible small abscess in the subcutaneous fat. Nonspecific dorsal subcutaneous edema. 2. No evidence of osteomyelitis or septic joint. 3. 1st MTP degenerative changes. Electronically Signed   By: Richardean Sale M.D.   On: 06/13/2018 14:40    Review of Systems  Constitutional: Negative for chills and fever.  HENT: Negative for nosebleeds and tinnitus.   Eyes: Negative for blurred vision and double vision.  Respiratory: Negative for cough and shortness of breath.   Cardiovascular: Negative for chest pain and palpitations.  Gastrointestinal: Negative for nausea and vomiting.  Genitourinary: Negative for dysuria.  Musculoskeletal:       Some pain in the right forefoot around the great toe joint.  Skin:       Redness and swelling in the right foot increasing over the last few days.  Neurological:       Relates some numbness and paresthesias related to his diabetes.  Endo/Heme/Allergies: Does not bruise/bleed easily.  Psychiatric/Behavioral: Negative for depression.   Blood pressure 132/72, pulse (!) 58, temperature 97.8 F (36.6 C), temperature  source Oral, resp. rate 18, height 5\' 7"  (1.702 m), weight 91.7 kg, SpO2 100 %. Physical Exam  Cardiovascular:  DP and PT pulses are diminished but palpable bilateral.  Musculoskeletal:     Comments: Pain on palpation and motion around the right forefoot and great toe joint.  Some guarding.  Muscle testing deferred.  Neurological:  Loss of protective threshold with a monofilament wire in some of the toes on the right foot with complete absence of toes and foot on the left.  Proprioception impaired bilateral.  Skin:  Skin is warm dry and supple.  Significant erythema and edema in the right foot with a plantar abscess and underlying ulceration beneath the first metatarsal head extending into the first webspace approximately 2-1/2 to 3 cm diameter.  Centrally there is a full-thickness wound which probes deep approximately 2 mm diameter probing to a  depth of 1-1/2 cm.  Also noted is a superficial abscess over the medial aspect of the joint.  Clear communication could not be noted between the 2.    Assessment/Plan: Assessment: 1.  Abscess and cellulitis right foot with full-thickness ulceration plantarly and medial. 2.  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  Plan: Excisional debridement of devitalized tissue from the ulcerative area on the right forefoot both superficial and full-thickness sharply using tissue nippers.  Some purulence was expressed from the plantar wound and a culture taken for sensitivities.  Sterile gauze bandage applied to the right foot.  Discussed with the patient that the MRI did not show evidence for bone or joint involvement but due to the depth of the abscess especially on the bottom of his foot he will need surgical I&D with debridement.  Discussed possible risks and complications of the procedure including inability to heal due to the infection or his diabetes.  No guarantees could be given as to the outcome.  Questions invited and answered.  Patient will be  n.p.o. after midnight.  At this point we will plan for debridement hopefully sometime tomorrow morning.  Consent form for I&D abscess right forefoot.  Again plan for surgery tomorrow.  Durward Fortes 06/13/2018, 5:25 PM

## 2018-06-13 NOTE — Consult Note (Signed)
Pharmacy Antibiotic Note  Mike [name] is a 72 y.o. male admitted on 06/13/2018 with sepsis.  Pharmacy has been consulted for vancomycin and Zosyn dosing. There is no recent history of vancomycin therapy to guide dosing.  Plan: 1) Vancomycin 1250mg  IV every 12 hours beginning 6 hours after 1st 1000mg  vancomycin dose.   Ke: 0.075 h-1  T1/2: 9.2h  Vd: 64.2L  Css: 34.9/15.3 mcg/mL  Goal trough 15-20 mcg/mL  Vt prior to 4th dose  2) Zosyn 3.375g EI IV every 8 hours  Height: 5\' 7"  (170.2 cm) Weight: 202 lb 2.6 oz (91.7 kg) IBW/kg (Calculated) : 66.1  Temp (24hrs), Avg:97.8 F (36.6 C), Min:97.8 F (36.6 C), Max:97.8 F (36.6 C)  Recent Labs  Lab 06/13/18 1243  WBC 8.9  CREATININE 0.82    Estimated Creatinine Clearance: 86.6 mL/min (by C-G formula based on SCr of 0.82 mg/dL).    Antimicrobials this admission: vancomycin 12/26 >>  Zosyn 12/26 >>   Microbiology results: 12/26 BCx: pending 12/26 MRSA PCR: pending  Thank you for allowing pharmacy to be a part of this patient's care.  Dallie Piles, PharmD 06/13/2018 1:20 PM

## 2018-06-13 NOTE — Care Management (Signed)
Attempted to assess patient but he was in MRI. Home health list left at bedside via [email or web address].

## 2018-06-13 NOTE — Progress Notes (Signed)
Pt admitted to 1A as direct admit.  Pt is A&Ox4, VSS. Pt denies pain to R foot. Pt and his sisters oriented to room and plan of care. Bed in lowest position, call bell within reach.

## 2018-06-13 NOTE — H&P (Signed)
[place] [facility] [place] NAME: Mike [name]    MR#:  [id number]  DATE OF BIRTH:  Oct 11, 1945  DATE OF ADMISSION:  06/13/2018  PRIMARY CARE PHYSICIAN: Adin Hector, MD   REQUESTING/REFERRING PHYSICIAN: Dr. Caryl Comes  CHIEF COMPLAINT: Right foot infection  No chief complaint on file.   HISTORY OF PRESENT ILLNESS:  Mike [name]  is a 72 y.o. male with a known history of epididymitis type II, neuropathy, hypertension sent in from Dr. Rebbeca Paul office because of worsening right foot infection.  Patient has right foot cellulitis since December 18, recently took antibiotics and had a follow-up today with PCP, sent in here because of worsening right foot infection for IV antibiotics and podiatry consult.  Patient right foot wound also seen by Dr. Caryl Comes as well, recommended right foot MRI, IV antibiotics.  Patient was here on December 18 for syncope.  He told me that he lost balance and syncopized at home, he lives alone.  According to sisters he lost consciousness for to 3 minutes, because of history of coronary artery disease, CABG, concerned about syncope.  Denies any right foot pain, fever.  Dressing present for the right foot now.  Does have some swelling, redness.  PAST MEDICAL HISTORY:   Past Medical History:  Diagnosis Date  . Anxiety   . Diabetes mellitus without complication ([place])   . Epilepsy ([place])   . High cholesterol   . Hypertension   . Prostate cancer [facility]) 2004   prostate removed    PAST SURGICAL HISTOIRY:  History reviewed. No pertinent surgical history.  SOCIAL HISTORY:   Social History   Tobacco Use  . Smoking status: Never Smoker  . Smokeless tobacco: Never Used  Substance Use Topics  . Alcohol use: No    FAMILY HISTORY:  History reviewed. No pertinent family history.  DRUG ALLERGIES:   Allergies  Allergen Reactions  . Aleve [Naproxen]   . Motrin [Ibuprofen] Nausea Only    REVIEW OF SYSTEMS:   CONSTITUTIONAL: No fever, fatigue or weakness.  EYES: No blurred or double vision.  EARS, NOSE, AND THROAT: No tinnitus or ear pain.  RESPIRATORY: No cough, shortness of breath, wheezing or hemoptysis.  CARDIOVASCULAR: No chest pain, orthopnea, edema.  GASTROINTESTINAL: No nausea, vomiting, diarrhea or abdominal pain.  GENITOURINARY: No dysuria, hematuria.  ENDOCRINE: No polyuria, nocturia,  HEMATOLOGY: No anemia, easy bruising or bleeding SKIN: No rash or lesion. MUSCULOSKELETAL: Noted to have right foot infection. NEUROLOGIC: No tingling, numbness, weakness.  PSYCHIATRY: No anxiety or depression.   MEDICATIONS AT HOME:   Prior to Admission medications   Medication Sig Start Date End Date Taking? Authorizing Provider  ALPRAZolam Duanne Moron) 0.5 MG tablet Take 0.5 mg by mouth at bedtime as needed for anxiety.    [provider]  atenolol (TENORMIN) 50 MG tablet Take 50 mg by mouth daily.    [provider]  atorvastatin (LIPITOR) 20 MG tablet Take 20 mg by mouth daily.    [provider]  clindamycin (CLEOCIN) 300 MG capsule Take 1 capsule (300 mg total) by mouth 3 (three) times daily for 10 days. 06/05/18 06/15/18  Sable Feil, PA-C  gabapentin (NEURONTIN) 300 MG capsule Take 300 mg by mouth daily.    [provider]  gabapentin (NEURONTIN) 300 MG capsule Take 1,200 mg by mouth at bedtime.    [provider]  glimepiride (AMARYL) 4 MG tablet Take 4 mg by mouth daily with breakfast.  [provider]  irbesartan (AVAPRO) 150 MG tablet Take 150 mg by mouth daily.    [provider]      VITAL SIGNS:  Blood pressure 132/72, pulse 63, temperature 97.8 F (36.6 C), temperature source Oral, resp. rate 18, height 5\' 7"  (1.702 m), weight 91.7 kg, SpO2 100 %.  PHYSICAL EXAMINATION:  GENERAL:  72 y.o.-year-old patient lying in the bed with no acute distress.  Slow to respond EYES: Pupils equal, round, reactive to light . No  scleral icterus. Extraocular muscles intact.  HEENT: Head atraumatic, normocephalic. Oropharynx and nasopharynx clear.  NECK:  Supple, no jugular venous distention. No thyroid enlargement, no tenderness.  LUNGS: Normal breath sounds bilaterally, no wheezing, rales,rhonchi or crepitation. No use of accessory muscles of respiration.  CARDIOVASCULAR: S1, S2 normal. No murmurs, rubs, or gallops.  ABDOMEN: Soft, nontender, nondistended. Bowel sounds present. No organomegaly or mass.  EXTREMITIES: Right foot edema, dressing present. NEUROLOGIC: Cranial nerves II through XII are intact. Muscle strength 5/5 in all extremities. Sensation intact. Gait not checked.  PSYCHIATRIC: The patient is alert and oriented x 3.  SKIN: No obvious rash, lesion, or ulcer.   LABORATORY PANEL:   CBC Recent Labs  Lab 06/13/18 1243  WBC 8.9  HGB 9.2*  HCT 30.4*  PLT 313   ------------------------------------------------------------------------------------------------------------------  Chemistries  No results for input(s): NA, K, CL, CO2, GLUCOSE, BUN, CREATININE, CALCIUM, MG, AST, ALT, ALKPHOS, BILITOT in the last 168 hours.  Invalid input(s): GFRCGP ------------------------------------------------------------------------------------------------------------------  Cardiac Enzymes No results for input(s): TROPONINI in the last 168 hours. ------------------------------------------------------------------------------------------------------------------  RADIOLOGY:  No results found.  EKG:   Orders placed or performed during the hospital encounter of 01/07/18  . EKG 12-Lead  . EKG 12-Lead  . ED EKG  . ED EKG    IMPRESSION AND PLAN:   72 year old male patient with history of diabetes mellitus type 2, CAD, CABG, GERD, seizure disorder sent in from PCP office because of worsening right foot infection.  Diabetic right foot infection: Continue IV vancomycin, Zosyn consult podiatry, MRI of the right foot  to evaluate for underlying osteomyelitis.  Possible local debridement by podiatry this evening.  Spoke with podiatry Dr. Thresa Ross.  Will get stat labs, blood cultures as well.  She was seen in the emergency room on 19 December and was given clindamycin for right foot infection. 2.  CAD, CABG, syncopized recently: Continue on off unit telemetry, patient denies any chest pain. #3 essential hypertension, CAD, CABG: Continue beta-blockers, statins,  #4 diabetes mellitus type 2: Patient is on Amaryl, continue Amaryl, diabetic diet, continue sliding scale insulin with coverage. #5 history of seizure disorder: Patient is on phenobarbital for long time, restart them. Diabetic neuropathy: Continue Neurontin 300 mg in the morning, 1200 mg in the evening. All the records are reviewed and case discussed with ED provider. Management plans discussed with the patient, family and they are in agreement.  CODE STATUS: Full code  TOTAL TIME TAKING CARE OF THIS PATIENT: 55 minutes.    Epifanio Lesches M.D on 06/13/2018 at 1:12 PM  Between 7am to 6pm - Pager - [phone]  After 6pm go to [email or web address] - password [email or web address] [facility]  Office  [phone]  CC: Primary care physician; Adin Hector, MD  Note: This dictation was prepared with Dragon dictation along with smaller phrase technology. Any transcriptional errors that result from this process are unintentional.

## 2018-06-14 ENCOUNTER — Encounter: Payer: Self-pay | Admitting: Anesthesiology

## 2018-06-14 ENCOUNTER — Encounter
Admission: AD | Disposition: A | Discharge status: Skilled Nursing Facility | Payer: Self-pay | Source: Ambulatory Visit | Attending: Internal Medicine

## 2018-06-14 ENCOUNTER — Inpatient Hospital Stay: Payer: Medicare Other | Admitting: Anesthesiology

## 2018-06-14 HISTORY — PX: IRRIGATION AND DEBRIDEMENT FOOT: SHX6602

## 2018-06-14 LAB — BASIC METABOLIC PANEL
Anion gap: 7 (ref 5–15)
BUN: <5 mg/dL — ABNORMAL LOW (ref 8–23)
CALCIUM: 8 mg/dL — AB (ref 8.9–10.3)
CO2: 28 mmol/L (ref 22–32)
Chloride: 105 mmol/L (ref 98–111)
Creatinine, Ser: 0.77 mg/dL (ref 0.61–1.24)
GFR calc Af Amer: >60 mL/min (ref >60)
GFR calc non Af Amer: >60 mL/min (ref >60)
Glucose, Bld: 175 mg/dL — ABNORMAL HIGH (ref 70–99)
Potassium: 3.1 mmol/L — ABNORMAL LOW (ref 3.5–5.1)
Sodium: 140 mmol/L (ref 135–145)

## 2018-06-14 LAB — RETICULOCYTES
Immature Retic Fract: 19.2 % — ABNORMAL HIGH (ref 2.3–15.9)
RBC.: 4.68 MIL/uL (ref 4.22–5.81)
Retic Count, Absolute: 82.8 10*3/uL (ref 19.0–186.0)
Retic Ct Pct: 1.8 % (ref 0.4–3.1)

## 2018-06-14 LAB — IRON AND TIBC
Iron: 58 ug/dL (ref 45–182)
SATURATION RATIOS: 24 % (ref 17.9–39.5)
TIBC: 247 ug/dL — ABNORMAL LOW (ref 250–450)
UIBC: 189 ug/dL

## 2018-06-14 LAB — GLUCOSE, CAPILLARY
GLUCOSE-CAPILLARY: 215 mg/dL — AB (ref 70–99)
Glucose-Capillary: 185 mg/dL — ABNORMAL HIGH (ref 70–99)
Glucose-Capillary: 169 mg/dL — ABNORMAL HIGH (ref 70–99)
Glucose-Capillary: 181 mg/dL — ABNORMAL HIGH (ref 70–99)

## 2018-06-14 LAB — HEMOGLOBIN A1C
Hgb A1c MFr Bld: 8.9 % — ABNORMAL HIGH (ref 4.8–5.6)
Mean Plasma Glucose: 208.73 mg/dL

## 2018-06-14 LAB — CBC
HCT: 26.7 % — ABNORMAL LOW (ref 39.0–52.0)
Hemoglobin: 8 g/dL — ABNORMAL LOW (ref 13.0–17.0)
MCH: 19.2 pg — ABNORMAL LOW (ref 26.0–34.0)
MCHC: 30 g/dL (ref 30.0–36.0)
MCV: 64.2 fL — ABNORMAL LOW (ref 80.0–100.0)
Platelets: 295 10*3/uL (ref 150–400)
RBC: 4.16 MIL/uL — ABNORMAL LOW (ref 4.22–5.81)
RDW: 15.2 % (ref 11.5–15.5)
WBC: 7 10*3/uL (ref 4.0–10.5)
nRBC: 0 % (ref 0.0–0.2)

## 2018-06-14 LAB — FOLATE: Folate: 35 ng/mL (ref >5.9)

## 2018-06-14 LAB — FERRITIN: Ferritin: 121 ng/mL (ref 24–336)

## 2018-06-14 SURGERY — IRRIGATION AND DEBRIDEMENT FOOT
Anesthesia: General | Site: Foot | Laterality: Right

## 2018-06-14 MED ORDER — HYDROCODONE-ACETAMINOPHEN 5-325 MG PO TABS
1.0000 | ORAL_TABLET | ORAL | Status: DC | PRN
  Administered 2018-06-14: 1 via ORAL
  Filled 2018-06-14: qty 1

## 2018-06-14 MED ORDER — ALPRAZOLAM 0.5 MG PO TABS
0.5000 mg | ORAL_TABLET | Freq: Every day | ORAL | Status: DC
  Administered 2018-06-15 – 2018-06-17 (×3): 0.5 mg via ORAL
  Filled 2018-06-14 (×3): qty 1

## 2018-06-14 MED ORDER — LIDOCAINE HCL (CARDIAC) PF 100 MG/5ML IV SOSY
PREFILLED_SYRINGE | INTRAVENOUS | Status: DC | PRN
  Administered 2018-06-14: 100 mg via INTRAVENOUS

## 2018-06-14 MED ORDER — PROPOFOL 10 MG/ML IV BOLUS
INTRAVENOUS | Status: AC
  Filled 2018-06-14: qty 20

## 2018-06-14 MED ORDER — FENTANYL CITRATE (PF) 100 MCG/2ML IJ SOLN: 25.0000 ug | INTRAMUSCULAR | Status: DC | PRN

## 2018-06-14 MED ORDER — FENTANYL CITRATE (PF) 100 MCG/2ML IJ SOLN
INTRAMUSCULAR | Status: AC
  Filled 2018-06-14: qty 2

## 2018-06-14 MED ORDER — PROPOFOL 10 MG/ML IV BOLUS
INTRAVENOUS | Status: DC | PRN
  Administered 2018-06-14: 100 mg via INTRAVENOUS
  Administered 2018-06-14: 20 mg via INTRAVENOUS

## 2018-06-14 MED ORDER — ONDANSETRON HCL 4 MG/2ML IJ SOLN
INTRAMUSCULAR | Status: AC
  Filled 2018-06-14: qty 2

## 2018-06-14 MED ORDER — MORPHINE SULFATE (PF) 2 MG/ML IV SOLN
2.0000 mg | INTRAVENOUS | Status: DC | PRN
  Administered 2018-06-14: 2 mg via INTRAVENOUS
  Filled 2018-06-14: qty 1

## 2018-06-14 MED ORDER — ALPRAZOLAM 0.5 MG PO TABS: 1.0000 mg | ORAL_TABLET | Freq: Every evening | ORAL | Status: DC | PRN

## 2018-06-14 MED ORDER — EPHEDRINE SULFATE 50 MG/ML IJ SOLN
INTRAMUSCULAR | Status: DC | PRN
  Administered 2018-06-14 (×2): 10 mg via INTRAVENOUS

## 2018-06-14 MED ORDER — GLIMEPIRIDE 4 MG PO TABS
4.0000 mg | ORAL_TABLET | Freq: Every day | ORAL | Status: DC
  Administered 2018-06-15 – 2018-06-17 (×3): 4 mg via ORAL
  Filled 2018-06-14 (×3): qty 1

## 2018-06-14 MED ORDER — FENTANYL CITRATE (PF) 100 MCG/2ML IJ SOLN
INTRAMUSCULAR | Status: DC | PRN
  Administered 2018-06-14 (×4): 25 ug via INTRAVENOUS

## 2018-06-14 MED ORDER — ONDANSETRON HCL 4 MG/2ML IJ SOLN: 4.0000 mg | Freq: Once | INTRAMUSCULAR | Status: DC | PRN

## 2018-06-14 MED ORDER — EPHEDRINE SULFATE 50 MG/ML IJ SOLN
INTRAMUSCULAR | Status: AC
  Filled 2018-06-14: qty 1

## 2018-06-14 MED ORDER — BUPIVACAINE HCL (PF) 0.5 % IJ SOLN
INTRAMUSCULAR | Status: DC | PRN
  Administered 2018-06-14: 16 mL

## 2018-06-14 MED ORDER — LACTATED RINGERS IV SOLN
INTRAVENOUS | Status: DC | PRN
  Administered 2018-06-14: 08:00:00 via INTRAVENOUS

## 2018-06-14 MED ORDER — POTASSIUM CHLORIDE CRYS ER 20 MEQ PO TBCR
40.0000 meq | EXTENDED_RELEASE_TABLET | ORAL | Status: AC
  Administered 2018-06-14 (×2): 40 meq via ORAL
  Filled 2018-06-14 (×2): qty 2

## 2018-06-14 SURGICAL SUPPLY — 45 items
BANDAGE ELASTIC 4 LF NS (GAUZE/BANDAGES/DRESSINGS) ×3 IMPLANT
BLADE OSCILLATING/SAGITTAL (BLADE)
BLADE SURG 15 STRL LF DISP TIS (BLADE) ×1 IMPLANT
BLADE SURG 15 STRL SS (BLADE) ×2
BLADE SW THK.38XMED LNG THN (BLADE) IMPLANT
BNDG CONFORM 2 STRL LF (GAUZE/BANDAGES/DRESSINGS) ×3 IMPLANT
BNDG ESMARK 4X12 TAN STRL LF (GAUZE/BANDAGES/DRESSINGS) ×3 IMPLANT
BNDG GAUZE 4.5X4.1 6PLY STRL (MISCELLANEOUS) ×3 IMPLANT
CANISTER SUCT 1200ML W/VALVE (MISCELLANEOUS) ×3 IMPLANT
COVER WAND RF STERILE (DRAPES) ×3 IMPLANT
CUFF TOURN 18 STER (MISCELLANEOUS) ×3 IMPLANT
CUFF TOURN DUAL PL 12 NO SLV (MISCELLANEOUS) ×3 IMPLANT
DRAPE FLUOR MINI C-ARM 54X84 (DRAPES) IMPLANT
DURAPREP 26ML APPLICATOR (WOUND CARE) ×3 IMPLANT
ELECT REM PT RETURN 9FT ADLT (ELECTROSURGICAL) ×3
ELECTRODE REM PT RTRN 9FT ADLT (ELECTROSURGICAL) ×1 IMPLANT
GAUZE PETRO XEROFOAM 1X8 (MISCELLANEOUS) ×3 IMPLANT
GAUZE SPONGE 4X4 12PLY STRL (GAUZE/BANDAGES/DRESSINGS) ×3 IMPLANT
GLOVE BIO SURGEON STRL SZ7.5 (GLOVE) ×3 IMPLANT
GLOVE INDICATOR 8.0 STRL GRN (GLOVE) ×3 IMPLANT
GOWN STRL REUS W/ TWL LRG LVL3 (GOWN DISPOSABLE) ×2 IMPLANT
GOWN STRL REUS W/TWL LRG LVL3 (GOWN DISPOSABLE) ×4
HANDPIECE VERSAJET DEBRIDEMENT (MISCELLANEOUS) ×3 IMPLANT
KIT STIMULAN RAPID CURE 5CC (Orthopedic Implant) ×3 IMPLANT
LABEL OR SOLS (LABEL) ×3 IMPLANT
NEEDLE FILTER BLUNT 18X 1/2SAF (NEEDLE) ×2
NEEDLE FILTER BLUNT 18X1 1/2 (NEEDLE) ×1 IMPLANT
NEEDLE HYPO 25X1 1.5 SAFETY (NEEDLE) ×9 IMPLANT
NS IRRIG 500ML POUR BTL (IV SOLUTION) ×3 IMPLANT
PACK EXTREMITY ARMC (MISCELLANEOUS) ×3 IMPLANT
PAD ABD DERMACEA PRESS 5X9 (GAUZE/BANDAGES/DRESSINGS) ×6 IMPLANT
PENCIL ELECTRO HAND CTR (MISCELLANEOUS) ×3 IMPLANT
RASP SM TEAR CROSS CUT (RASP) IMPLANT
SOL PREP PVP 2OZ (MISCELLANEOUS) ×3
SOLUTION PREP PVP 2OZ (MISCELLANEOUS) ×1 IMPLANT
STOCKINETTE STRL 4IN 9604848 (GAUZE/BANDAGES/DRESSINGS) ×3 IMPLANT
STOCKINETTE STRL 6IN 960660 (GAUZE/BANDAGES/DRESSINGS) ×3 IMPLANT
SUT ETHILON 4-0 (SUTURE) ×2
SUT ETHILON 4-0 FS2 18XMFL BLK (SUTURE) ×1
SUT VIC AB 3-0 SH 27 (SUTURE) ×2
SUT VIC AB 3-0 SH 27X BRD (SUTURE) ×1 IMPLANT
SUT VIC AB 4-0 FS2 27 (SUTURE) ×3 IMPLANT
SUTURE ETHLN 4-0 FS2 18XMF BLK (SUTURE) ×1 IMPLANT
SYR 10ML LL (SYRINGE) ×6 IMPLANT
SYR 3ML LL SCALE MARK (SYRINGE) ×3 IMPLANT

## 2018-06-14 NOTE — Anesthesia Postprocedure Evaluation (Signed)
Anesthesia Post Note  Patient: Mike [name]  Procedure(s) Performed: IRRIGATION AND DEBRIDEMENT FOOT (Right Foot)  Patient location during evaluation: PACU Anesthesia Type: General Level of consciousness: awake and alert Pain management: pain level controlled Vital Signs Assessment: post-procedure vital signs reviewed and stable Respiratory status: spontaneous breathing, nonlabored ventilation, respiratory function stable and patient connected to nasal cannula oxygen Cardiovascular status: blood pressure returned to baseline and stable Postop Assessment: no apparent nausea or vomiting Anesthetic complications: no     Last Vitals:  Vitals:   06/14/18 1152 06/14/18 1233  BP: (!) 159/68 (!) 155/68  Pulse: 66 65  Resp: 20 20  Temp: 36.7 C 36.8 C  SpO2: 98% 97%    Last Pain:  Vitals:   06/14/18 1233  TempSrc: Oral  PainSc:                  Aasia Peavler S

## 2018-06-14 NOTE — Transfer of Care (Signed)
Immediate Anesthesia Transfer of Care Note  Patient: Mike [name]  Procedure(s) Performed: IRRIGATION AND DEBRIDEMENT FOOT (Right Foot)  Patient Location: PACU  Anesthesia Type:General  Level of Consciousness: awake, alert  and oriented  Airway & Oxygen Therapy: Patient Spontanous Breathing and Patient connected to face mask oxygen  Post-op Assessment: Report given to RN and Post -op Vital signs reviewed and stable  Post vital signs: Reviewed and stable  Last Vitals:  Vitals Value Taken Time  BP 124/70 06/14/2018  8:45 AM  Temp 36.1 C 06/14/2018  8:45 AM  Pulse 79 06/14/2018  8:46 AM  Resp 16 06/14/2018  8:46 AM  SpO2 100 % 06/14/2018  8:46 AM  Vitals shown include unvalidated device data.  Last Pain:  Vitals:   06/14/18 0845  TempSrc:   PainSc: 0-No pain         Complications: No apparent anesthesia complications

## 2018-06-14 NOTE — Progress Notes (Signed)
[facility] [place] [facility] [place] NAME: Mike [name]    MR#:  [id number]  DATE OF BIRTH:  06-15-1946  SUBJECTIVE:  CHIEF COMPLAINT:  No chief complaint on file.  -Complains of anxiety and inability to sleep last night.  Requesting to adjust his Xanax to home dose  REVIEW OF SYSTEMS:  Review of Systems  Constitutional: Negative for chills, fever and malaise/fatigue.  HENT: Negative for congestion, ear discharge, hearing loss and nosebleeds.   Eyes: Negative for blurred vision, double vision and photophobia.  Respiratory: Negative for cough, shortness of breath and wheezing.   Cardiovascular: Negative for chest pain and palpitations.  Gastrointestinal: Negative for abdominal pain, constipation, diarrhea, nausea and vomiting.  Genitourinary: Negative for dysuria.  Musculoskeletal: Positive for joint pain and myalgias.  Neurological: Negative for dizziness, focal weakness, seizures, weakness and headaches.  Psychiatric/Behavioral: Negative for depression. The patient is nervous/anxious and has insomnia.     DRUG ALLERGIES:   Allergies  Allergen Reactions  . Aleve [Naproxen]   . Motrin [Ibuprofen] Nausea Only    VITALS:  Blood pressure (!) 155/68, pulse 65, temperature 98.2 F (36.8 C), temperature source Oral, resp. rate 20, height 5\' 7"  (1.702 m), weight 90.9 kg, SpO2 97 %.  PHYSICAL EXAMINATION:  Physical Exam   GENERAL:  72 y.o.-year-old elderly patient lying in the bed with no acute distress.  EYES: Pupils equal, round, reactive to light and accommodation. No scleral icterus. Extraocular muscles intact.  HEENT: Head atraumatic, normocephalic. Oropharynx and nasopharynx clear.  NECK:  Supple, no jugular venous distention. No thyroid enlargement, no tenderness.  LUNGS: Normal breath sounds bilaterally, no wheezing, rales,rhonchi or crepitation. No use of accessory muscles of respiration.  Decreased bibasilar breath sounds CARDIOVASCULAR: S1,  S2 normal. No  rubs, or gallops.  3/6 systolic murmur is present ABDOMEN: Soft, nontender, nondistended. Bowel sounds present. No organomegaly or mass.  EXTREMITIES: Right foot in heavy dressing noted.  No pedal edema, cyanosis, or clubbing.  NEUROLOGIC: Cranial nerves II through XII are intact. Muscle strength 5/5 in all extremities. Sensation intact. Gait not checked.  PSYCHIATRIC: The patient is alert and oriented x 3.  SKIN: No obvious rash, lesion, or ulcer.    LABORATORY PANEL:   CBC Recent Labs  Lab 06/14/18 0308  WBC 7.0  HGB 8.0*  HCT 26.7*  PLT 295   ------------------------------------------------------------------------------------------------------------------  Chemistries  Recent Labs  Lab 06/14/18 0308  NA 140  K 3.1*  CL 105  CO2 28  GLUCOSE 175*  BUN <5*  CREATININE 0.77  CALCIUM 8.0*   ------------------------------------------------------------------------------------------------------------------  Cardiac Enzymes No results for input(s): TROPONINI in the last 168 hours. ------------------------------------------------------------------------------------------------------------------  RADIOLOGY:  Mr Foot Right Wo Contrast  Result Date: 06/13/2018 CLINICAL DATA:  Worsening right foot infection despite antibiotics started 8 days ago for cellulitis. History of diabetes and neuropathy. Question osteomyelitis. EXAM: MRI OF THE RIGHT FOREFOOT WITHOUT CONTRAST TECHNIQUE: Multiplanar, multisequence MR imaging of the right forefoot was performed. No intravenous contrast was administered. COMPARISON:  Radiographs 06/05/2018. FINDINGS: Bones/Joint/Cartilage No findings highly suspicious for osteomyelitis or septic joint. There are mild degenerative changes of the 1st metatarsophalangeal joint with subchondral cyst formation medially in the 1st metatarsal head. There is mild edema in the tibial sesamoid. No suspicious T1 signal abnormality or cortical destruction.  No significant joint effusions. Ligaments The Lisfranc ligament is intact. Muscles and Tendons Intact forefoot tendons without significant tenosynovitis. No focal muscular abnormalities. 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 Soft tissues Moderate dorsal forefoot subcutaneous edema without focal fluid collection. There appears to be some skin ulceration along the plantar aspect of the 1st metatarsophalangeal joint, suggesting an open wound. There is edema and ill-defined fluid within the underlying subcutaneous fat. Largest fluid component measures approximately 8 mm on image 21/6. No well-defined fluid collections identified on noncontrast imaging. IMPRESSION: 1. Suspected wound involving the plantar aspect of the medial forefoot with underlying inflammation and possible small abscess in the subcutaneous fat. Nonspecific dorsal subcutaneous edema. 2. No evidence of osteomyelitis or septic joint. 3. 1st MTP degenerative changes. Electronically Signed   By: Richardean Sale M.D.   On: 06/13/2018 14:40    EKG:   Orders placed or performed during the hospital encounter of 01/07/18  . EKG 12-Lead  . EKG 12-Lead  . ED EKG  . ED EKG    ASSESSMENT AND PLAN:   72 year old male with past medical history significant for hypertension, non-insulin-dependent diabetes mellitus, anxiety and depression, prostate cancer history presents to hospital secondary to nonhealing right plantar surface wound.  1.  Diabetic foot ulcer with cellulitis-appreciate podiatry consult.  Patient has nonhealing wound on the plantar surface of first metatarsal on the right foot. -Failed outpatient antibiotics with clindamycin. -Appreciate podiatry consult -Status post debridement of the abscess today and wound impregnated with vancomycin.  Cultures are pending -Continue vancomycin and Zosyn for now -Management per podiatry  2.  Acute on chronic anemia-hemoglobin drop noted from 9  to 8 today -Likely has iron deficiency as has low MCV.  Check anemia panel and replace IV iron if needed -No indication for transfusion today.  Continue to monitor.  3.  Hypokalemia-replaced  4.  Non-insulin-dependent diabetes mellitus-A1c is 8.9. -Restart home dose of glimepiride and sliding scale insulin for now  5.  Peripheral neuropathy-continue high-dose of gabapentin  6.  Hypertension-on Avapro and Toprol  7.  Anxiety and depression-on Paxil and Xanax  8.  DVT prophylaxis-Lovenox  Physical therapy consult after podiatry weightbearing recommendations   All the records are reviewed and case discussed with Care Management/Social Workerr. Management plans discussed with the patient, family and they are in agreement.  CODE STATUS: Full code  TOTAL TIME TAKING CARE OF THIS PATIENT: 38 minutes.   POSSIBLE D/C IN 2-3 DAYS, DEPENDING ON CLINICAL CONDITION.   Gladstone Lighter M.D on 06/14/2018 at 1:07 PM  Between 7am to 6pm - Pager - [phone]  After 6pm go to [email or web address] - password [email or web address] [facility]  Office  [phone]  CC: Primary care physician; Adin Hector, MD

## 2018-06-14 NOTE — Clinical Social Work Note (Signed)
Clinical Social Work Assessment  Patient Details  Name: Mike [name] MRN: [id number] Date of Birth: 1945/08/09  Date of referral:  06/14/18               Reason for consult:  Facility Placement                Permission sought to share information with:  Chartered certified accountant granted to share information::  Yes, Verbal Permission Granted  Name::      Mike::   [name]   Relationship::     Contact Information:     Housing/Transportation Living arrangements for the past 2 months:  [place] of Information:  Patient Patient Interpreter Needed:  None Criminal Activity/Legal Involvement Pertinent to Current Situation/Hospitalization:  No - Comment as needed Significant Relationships:  Siblings Lives with:  Self Do you feel safe going back to the place where you live?  Yes Need for family participation in patient care:  Yes (Comment)  Care giving concerns:  Patient lives in [place] alone.    Social Worker assessment / plan:  Holiday representative (CSW) met with patient alone at bedside to discuss D/C plan. Patient was alert and oriented X4 and was laying in the bed. PT is pending. Patient had an I&D today. CSW introduced self and explained role of CSW department. Per patient he lives alone in [place] and has no children and has never been married. Patient reported that he has 2 sisters, Mike [name] and Mike [name] that are his primary support. CSW explained that PT will evaluate him and make a recommendation of home health or SNF. CSW explained that medicare requires a 3 night qualifying inpatient hospital stay in order to pay for SNF. Patient was admitted to inpatient 06/13/18. Patient verbalized his understanding and prefers to go to SNF. Patient is agreeable to SNF search in [facility]. FL2 complete and faxed out. CSW will continue to follow and assist as needed.   Employment status:  Retired Forensic scientist:   Medicare PT Recommendations:  Not assessed at this time [place] / Referral to community resources:  [place]  Patient/Family's Response to care:  Patient prefers to go to SNF.   Patient/Family's Understanding of and Emotional Response to Diagnosis, Current Treatment, and Prognosis:  Patient was very pleasant and thanked CSW for assistance.   Emotional Assessment Appearance:  Appears stated age Attitude/Demeanor/Rapport:    Affect (typically observed):  Accepting, Adaptable, Pleasant Orientation:  Oriented to Self, Oriented to Place, Oriented to  Time, Oriented to Situation Alcohol / Substance use:  Not Applicable Psych involvement (Current and /or in the community):  No (Comment)  Discharge Needs  Concerns to be addressed:  Discharge Planning Concerns Readmission within the last 30 days:  No Current discharge risk:  Dependent with Mobility Barriers to Discharge:  Continued Medical Work up   [employer], Veronia Beets, LCSW 06/14/2018, 4:55 PM

## 2018-06-14 NOTE — Anesthesia Procedure Notes (Signed)
Procedure Name: LMA Insertion Date/Time: 06/14/2018 7:42 AM Performed by: Hedda Slade, CRNA Pre-anesthesia Checklist: Patient identified, Patient being monitored, Timeout performed, Emergency Drugs available and Suction available Patient Re-evaluated:Patient Re-evaluated prior to induction Oxygen Delivery Method: Circle system utilized Preoxygenation: Pre-oxygenation with 100% oxygen Induction Type: IV induction Ventilation: Mask ventilation without difficulty and Oral airway inserted - appropriate to patient size LMA: LMA inserted LMA Size: 4.5 Tube type: Oral Number of attempts: 1 Placement Confirmation: positive ETCO2 and breath sounds checked- equal and bilateral Tube secured with: Tape Dental Injury: Teeth and Oropharynx as per pre-operative assessment

## 2018-06-14 NOTE — Op Note (Signed)
Date of operation 06/14/2018.  Surgeon: Durward Fortes D.P.M.  Preoperative diagnosis: Cellulitis with abscess right foot.  Postoperative diagnosis: Same.  Procedure: Multiple incision I&D abscess right foot.  Anesthesia: LMA with local.  Hemostasis: Pneumatic tourniquet right ankle 250 mmHg.  Estimated blood loss: Less than 5 cc.  Cultures: Deep wound cultures abscess right forefoot.  Pathology: None.  Implants: Stimulan rapid cure antibiotic beads impregnated with vancomycin.  Complications: None apparent.  Operative indications: This is a 72 year old male with recent history of infection in his right foot.  Failure of response to outpatient oral antibiotics and patient was admitted for IV antibiotics and debridement.  Operative procedure: Patient was taken to the operating room and placed on the table in the supine position.  Following satisfactory LMA anesthesia a pneumatic tourniquet was applied at the level of the right ankle and the foot was prepped and draped in usual sterile fashion.  The foot was exsanguinated and the tourniquet inflated to 250 mmHg.  Attention was then directed to the plantar aspect of the right foot where a full-thickness ulceration with abscess was noted beneath the first metatarsal and an approximate 5 cm slightly curvilinear incision was made.  The incision was deepened via sharp and blunt dissection revealing some purulence and devitalized tissue at the central aspect of the wound and extending medially.  Using a pair of rongeurs the devitalized tissue was excisionally debrided full-thickness down to the level of the deep fascia and capsular tissue.  No clear evidence of any exposed bone or joint.  At this point an approximate 3 cm linear incision was made medial over the first metatarsal head over a secondary ulceration and was deepened via sharp and blunt dissection.  There was communication with the plantar wound and devitalized tissue was again  excisionally debrided using a rongeur and then all remaining tissues thoroughly debrided and irrigated with a versa jet debrider on a setting of 3 and 2.  The wounds were then flushed with copious amounts of sterile saline using a bulb syringe and some Stimulan rapid cure antibiotic beads were placed within the medial incision area which was then closed using 3-0 nylon simple interrupted suture.  Antibiotic beads then placed plantarly within the wound and the plantar wound closed using 3-0 nylon simple interrupted sutures.  A small area was left open for drainage at the ulcerative site in the central aspect.  Xeroform 4 x 4's ABDs and Kerlix applied to the right foot.  Tourniquet was released and then a second Kerlix and Ace wrap applied.  Patient tolerated the procedure and anesthesia well and was transported to the PACU with vital signs stable in good condition.

## 2018-06-14 NOTE — NC FL2 (Signed)
[place] LEVEL OF CARE SCREENING TOOL     IDENTIFICATION  Patient Name: Mike [name] Birthdate: 28-Mar-1946 Sex: male Admission Date (Current Location): 06/13/2018  [place] and [place] Number:  Engineering geologist and Address:  [facility], [address] [place], [place] [address]      Provider Number: [id number]  Attending Physician Name and Address:  Gladstone Lighter, MD  Relative Name and Phone Number:       Current Level of Care: Hospital Recommended Level of Care: [place] Prior Approval Number:    Date Approved/Denied:   PASRR Number: [id number])  Discharge Plan: SNF    Current Diagnoses: Patient Active Problem List   Diagnosis Date Noted  . 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 (Green) 06/13/2018    Orientation RESPIRATION BLADDER Height & Weight     Self, Time, Situation, Place  Normal Continent Weight: 200 lb 6.4 oz (90.9 kg) Height:  5\' 7"  (170.2 cm)  BEHAVIORAL SYMPTOMS/MOOD NEUROLOGICAL BOWEL NUTRITION STATUS      Incontinent Diet(Diet: Carb Modified. )  AMBULATORY STATUS COMMUNICATION OF NEEDS Skin   Extensive Assist Verbally Surgical wounds(Incision: Right Foot. )                       Personal Care Assistance Level of Assistance  Bathing, Feeding, Dressing Bathing Assistance: Limited assistance Feeding assistance: Independent Dressing Assistance: Limited assistance     Functional Limitations Info  Sight, Hearing, Speech Sight Info: Adequate Hearing Info: Adequate Speech Info: Adequate    SPECIAL CARE FACTORS FREQUENCY  PT (By licensed PT), OT (By licensed OT)     PT Frequency: (5) OT Frequency: (5)            Contractures      Additional Factors Info  Code Status, Allergies Code Status Info: (Full Code. ) Allergies Info: (Aleve Naproxen, Motrin Ibuprofen)           Current Medications (06/14/2018):  This is the current hospital active medication list Current  Facility-Administered Medications  Medication Dose Route Frequency Provider Last Rate Last Dose  . 0.9 %  sodium chloride infusion   Intravenous Continuous Sharlotte Alamo, DPM 75 mL/hr at 06/14/18 1600    . acetaminophen (TYLENOL) tablet 650 mg  650 mg Oral Q6H PRN Sharlotte Alamo, DPM       Or  . acetaminophen (TYLENOL) suppository 650 mg  650 mg Rectal Q6H PRN Sharlotte Alamo, DPM      . Derrill Memo ON 06/15/2018] ALPRAZolam Duanne Moron) tablet 0.5 mg  0.5 mg Oral Daily Gladstone Lighter, MD      . ALPRAZolam Duanne Moron) tablet 1 mg  1 mg Oral QHS PRN Gladstone Lighter, MD      . atorvastatin (LIPITOR) tablet 20 mg  20 mg Oral QPM Sharlotte Alamo, DPM   20 mg at 06/13/18 1850  . bisacodyl (DULCOLAX) EC tablet 5 mg  5 mg Oral Daily PRN Sharlotte Alamo, DPM      . docusate sodium (COLACE) capsule 100 mg  100 mg Oral BID Sharlotte Alamo, DPM   100 mg at 06/14/18 0945  . enoxaparin (LOVENOX) injection 40 mg  40 mg Subcutaneous Q24H Sharlotte Alamo, DPM      . gabapentin (NEURONTIN) capsule 1,200 mg  1,200 mg Oral QHS Sharlotte Alamo, DPM   1,200 mg at 06/13/18 2220  . gabapentin (NEURONTIN) capsule 300 mg  300 mg Oral Daily Sharlotte Alamo, DPM   300 mg at 06/14/18 0945  . [  START ON 06/15/2018] glimepiride (AMARYL) tablet 4 mg  4 mg Oral Q breakfast Gladstone Lighter, MD      . HYDROcodone-acetaminophen (NORCO/VICODIN) 5-325 MG per tablet 1-2 tablet  1-2 tablet Oral Q4H PRN Sharlotte Alamo, DPM      . insulin aspart (novoLOG) injection 0-9 Units  0-9 Units Subcutaneous TID WC Sharlotte Alamo, DPM   2 Units at 06/14/18 1303  . irbesartan (AVAPRO) tablet 150 mg  150 mg Oral Daily Sharlotte Alamo, DPM   150 mg at 06/14/18 0945  . metoprolol succinate (TOPROL-XL) 24 hr tablet 50 mg  50 mg Oral Daily Sharlotte Alamo, DPM   50 mg at 06/14/18 2836  . morphine 2 MG/ML injection 2 mg  2 mg Intravenous Q2H PRN Sharlotte Alamo, DPM      . ondansetron [facility]) tablet 4 mg  4 mg Oral Q6H PRN Sharlotte Alamo, DPM       Or  . ondansetron [facility]) injection 4 mg  4 mg Intravenous  Q6H PRN Sharlotte Alamo, DPM   4 mg at 06/14/18 0824  . PARoxetine (PAXIL) tablet 20 mg  20 mg Oral Daily Gladstone Lighter, MD   20 mg at 06/14/18 0945  . PHENobarbital (LUMINAL) tablet 64.8 mg  64.8 mg Oral TID Sharlotte Alamo, DPM   64.8 mg at 06/14/18 1619  . piperacillin-tazobactam (ZOSYN) IVPB 3.375 g  3.375 g Intravenous Q8H Sharlotte Alamo, DPM 12.5 mL/hr at 06/14/18 1600    . traZODone (DESYREL) tablet 25 mg  25 mg Oral QHS PRN Sharlotte Alamo, DPM      . vancomycin (VANCOCIN) 1,250 mg in sodium chloride 0.9 % 250 mL IVPB  1,250 mg Intravenous Q12H Sharlotte Alamo, DPM   Stopped at 06/14/18 1125     Discharge Medications: Please see discharge summary for a list of discharge medications.  Relevant Imaging Results:  Relevant Lab Results:   Additional Information (SSN: [id number])  Rilley Poulter, Veronia Beets, LCSW

## 2018-06-14 NOTE — Clinical Social Work Placement (Signed)
   CLINICAL SOCIAL WORK PLACEMENT  NOTE  Date:  06/14/2018  Patient Details  Name: Mike [name] MRN: [id number] Date of Birth: Nov 25, 1945  Clinical Social Work is seeking post-discharge placement for this patient at the [place] level of care (*CSW will initial, date and re-position this form in  chart as items are completed):  Yes   Patient/family provided with [facility] Work Department's list of facilities offering this level of care within the geographic area requested by the patient (or if unable, by the patient's family).  Yes   Patient/family informed of their freedom to choose among providers that offer the needed level of care, that participate in Medicare, Medicaid or managed care program needed by the patient, have an available bed and are willing to accept the patient.  Yes   Patient/family informed of [facility]'s ownership interest in [facility] and [facility], as well as of the fact that they are under no obligation to receive care at these facilities.  PASRR submitted to EDS on 06/14/18     PASRR number received on 06/14/18     Existing PASRR number confirmed on       FL2 transmitted to all facilities in geographic area requested by pt/family on 06/14/18     FL2 transmitted to all facilities within larger geographic area on       Patient informed that his/her managed care company has contracts with or will negotiate with certain facilities, including the following:            Patient/family informed of bed offers received.  Patient chooses bed at       Physician recommends and patient chooses bed at      Patient to be transferred to   on  .  Patient to be transferred to facility by       Patient family notified on   of transfer.  Name of family member notified:        PHYSICIAN       Additional Comment:    _______________________________________________ Caroleann Casler, Veronia Beets, LCSW 06/14/2018, 4:53 PM

## 2018-06-14 NOTE — Anesthesia Post-op Follow-up Note (Signed)
Anesthesia QCDR form completed.        

## 2018-06-14 NOTE — Consult Note (Signed)
Pharmacy Antibiotic Note  Mike [name] is a 72 y.o. male admitted on 06/13/2018 with sepsis d/t cellulitis. Pharmacy has been consulted for vancomycin and Zosyn dosing. There is no recent history of vancomycin therapy to guide dosing. Since yesterday's note his renal function is stable. An I&D is scheduled for today   Plan: 1) Vancomycin 1250mg  IV every 12 hours beginning 6 hours after 1st 1000mg  vancomycin dose.   Ke: 0.075 h-1  T1/2: 9.2h  Vd: 64.2L  Css: 34.9/15.3 mcg/mL  Goal trough 15-20 mcg/mL  Vt prior to 4th dose  2) Zosyn 3.375g EI IV every 8 hours  Height: 5\' 7"  (170.2 cm) Weight: 200 lb 6.4 oz (90.9 kg) IBW/kg (Calculated) : 66.1  Temp (24hrs), Avg:97.6 F (36.4 C), Min:97 F (36.1 C), Max:98.3 F (36.8 C)  Recent Labs  Lab 06/13/18 1243 06/14/18 0308  WBC 8.9 7.0  CREATININE 0.82 0.77    Estimated Creatinine Clearance: 88.4 mL/min (by C-G formula based on SCr of 0.77 mg/dL).    Antimicrobials this admission: vancomycin 12/26 >>  Zosyn 12/26 >>   Microbiology results: 12/27 WCx pending 12/26 WCx rare GPC 12/26 BCx pending 12/26 MRSA PCR (-)  Thank you for allowing pharmacy to be a part of this patient's care.  Dallie Piles, PharmD 06/14/2018 10:34 AM

## 2018-06-14 NOTE — Anesthesia Preprocedure Evaluation (Signed)
Anesthesia Evaluation  Patient identified by MRN, date of birth, ID band Patient awake    Reviewed: Allergy & Precautions, NPO status , Patient's Chart, lab work & pertinent test results, reviewed documented beta blocker date and time   Airway Mallampati: III  TM Distance: >3 FB     Dental  (+) Upper Dentures, Lower Dentures   Pulmonary           Cardiovascular hypertension, Pt. on medications + CABG       Neuro/Psych Seizures -,  Anxiety    GI/Hepatic   Endo/Other  diabetes, Type 2  Renal/GU      Musculoskeletal   Abdominal   Peds  Hematology   Anesthesia Other Findings   Reproductive/Obstetrics                             Anesthesia Physical Anesthesia Plan  ASA: III  Anesthesia Plan: General   Post-op Pain Management:    Induction: Intravenous  PONV Risk Score and Plan:   Airway Management Planned: LMA  Additional Equipment:   Intra-op Plan:   Post-operative Plan:   Informed Consent: I have reviewed the patients History and Physical, chart, labs and discussed the procedure including the risks, benefits and alternatives for the proposed anesthesia with the patient or authorized representative who has indicated his/her understanding and acceptance.     Plan Discussed with: CRNA  Anesthesia Plan Comments:         Anesthesia Quick Evaluation

## 2018-06-15 LAB — BASIC METABOLIC PANEL
ANION GAP: 7 (ref 5–15)
BUN: <5 mg/dL — ABNORMAL LOW (ref 8–23)
CALCIUM: 8.3 mg/dL — AB (ref 8.9–10.3)
CO2: 23 mmol/L (ref 22–32)
Chloride: 110 mmol/L (ref 98–111)
Creatinine, Ser: 0.73 mg/dL (ref 0.61–1.24)
GFR calc non Af Amer: >60 mL/min (ref >60)
Glucose, Bld: 155 mg/dL — ABNORMAL HIGH (ref 70–99)
Potassium: 5.1 mmol/L (ref 3.5–5.1)
Sodium: 140 mmol/L (ref 135–145)

## 2018-06-15 LAB — GLUCOSE, CAPILLARY
Glucose-Capillary: 149 mg/dL — ABNORMAL HIGH (ref 70–99)
Glucose-Capillary: 145 mg/dL — ABNORMAL HIGH (ref 70–99)
Glucose-Capillary: 227 mg/dL — ABNORMAL HIGH (ref 70–99)
Glucose-Capillary: 181 mg/dL — ABNORMAL HIGH (ref 70–99)

## 2018-06-15 LAB — CBC
HCT: 30.8 % — ABNORMAL LOW (ref 39.0–52.0)
Hemoglobin: 9.4 g/dL — ABNORMAL LOW (ref 13.0–17.0)
MCH: 19.5 pg — ABNORMAL LOW (ref 26.0–34.0)
MCHC: 30.5 g/dL (ref 30.0–36.0)
MCV: 63.9 fL — ABNORMAL LOW (ref 80.0–100.0)
NRBC: 0 % (ref 0.0–0.2)
Platelets: 288 10*3/uL (ref 150–400)
RBC: 4.82 MIL/uL (ref 4.22–5.81)
RDW: 15.5 % (ref 11.5–15.5)
WBC: 9.5 10*3/uL (ref 4.0–10.5)

## 2018-06-15 LAB — VITAMIN B12: Vitamin B-12: 2417 pg/mL — ABNORMAL HIGH (ref 180–914)

## 2018-06-15 LAB — VANCOMYCIN, TROUGH: Vancomycin Tr: 21 ug/mL (ref 15–20)

## 2018-06-15 MED ORDER — SODIUM CHLORIDE 0.9 % IV SOLN
2.0000 g | Freq: Two times a day (BID) | INTRAVENOUS | Status: DC
  Administered 2018-06-15 – 2018-06-16 (×4): 2 g via INTRAVENOUS
  Filled 2018-06-15 (×7): qty 2

## 2018-06-15 MED ORDER — AMLODIPINE BESYLATE 5 MG PO TABS
5.0000 mg | ORAL_TABLET | Freq: Every day | ORAL | Status: DC
  Administered 2018-06-15 – 2018-06-16 (×2): 5 mg via ORAL
  Filled 2018-06-15 (×2): qty 1

## 2018-06-15 MED ORDER — VANCOMYCIN HCL IN DEXTROSE 1-5 GM/200ML-% IV SOLN
1000.0000 mg | Freq: Two times a day (BID) | INTRAVENOUS | Status: DC
  Administered 2018-06-15 – 2018-06-16 (×3): 1000 mg via INTRAVENOUS
  Filled 2018-06-15 (×5): qty 200

## 2018-06-15 MED ORDER — RISAQUAD PO CAPS
1.0000 | ORAL_CAPSULE | Freq: Every day | ORAL | Status: DC
  Administered 2018-06-15 – 2018-06-16 (×2): 1 via ORAL
  Filled 2018-06-15 (×4): qty 1

## 2018-06-15 MED ORDER — HYDRALAZINE HCL 20 MG/ML IJ SOLN
10.0000 mg | Freq: Four times a day (QID) | INTRAMUSCULAR | Status: DC | PRN
  Administered 2018-06-15: 10 mg via INTRAVENOUS
  Filled 2018-06-15: qty 1

## 2018-06-15 NOTE — Progress Notes (Signed)
[facility] at [place] NAME: Mike [name]    MR#:  [id number]  DATE OF BIRTH:  1945/06/22  SUBJECTIVE:  CHIEF COMPLAINT:  No chief complaint on file.  -Feels much better today.  Still has right foot pain. -Blood pressure is elevated today  REVIEW OF SYSTEMS:  Review of Systems  Constitutional: Negative for chills, fever and malaise/fatigue.  HENT: Negative for congestion, ear discharge, hearing loss and nosebleeds.   Eyes: Negative for blurred vision, double vision and photophobia.  Respiratory: Negative for cough, shortness of breath and wheezing.   Cardiovascular: Negative for chest pain and palpitations.  Gastrointestinal: Negative for abdominal pain, constipation, diarrhea, nausea and vomiting.  Genitourinary: Negative for dysuria.  Musculoskeletal: Positive for joint pain and myalgias.  Neurological: Negative for dizziness, focal weakness, seizures, weakness and headaches.  Psychiatric/Behavioral: Negative for depression. The patient is not nervous/anxious and does not have insomnia.     DRUG ALLERGIES:   Allergies  Allergen Reactions  . Aleve [Naproxen]   . Motrin [Ibuprofen] Nausea Only    VITALS:  Blood pressure (!) 201/78, pulse 65, temperature 98.4 F (36.9 C), temperature source Oral, resp. rate 14, height 5\' 7"  (1.702 m), weight 94.9 kg, SpO2 100 %.  PHYSICAL EXAMINATION:  Physical Exam   GENERAL:  72 y.o.-year-old elderly patient lying in the bed with no acute distress.  EYES: Pupils equal, round, reactive to light and accommodation. No scleral icterus. Extraocular muscles intact.  HEENT: Head atraumatic, normocephalic. Oropharynx and nasopharynx clear.  NECK:  Supple, no jugular venous distention. No thyroid enlargement, no tenderness.  LUNGS: Normal breath sounds bilaterally, no wheezing, rales,rhonchi or crepitation. No use of accessory muscles of respiration.  Decreased bibasilar breath sounds CARDIOVASCULAR: S1,  S2 normal. No  rubs, or gallops.  3/6 systolic murmur is present ABDOMEN: Soft, nontender, nondistended. Bowel sounds present. No organomegaly or mass.  EXTREMITIES: Right foot in heavy dressing noted.  When dressing opened, he has 2 surgical opening with sutures in place on the right foot plantar surface in the fore foot region. no pedal edema, cyanosis, or clubbing.  NEUROLOGIC: Cranial nerves II through XII are intact. Muscle strength 5/5 in all extremities. Sensation intact. Gait not checked.  PSYCHIATRIC: The patient is alert and oriented x 3.  SKIN: No obvious rash, lesion, or ulcer.    LABORATORY PANEL:   CBC Recent Labs  Lab 06/15/18 0508  WBC 9.5  HGB 9.4*  HCT 30.8*  PLT 288   ------------------------------------------------------------------------------------------------------------------  Chemistries  Recent Labs  Lab 06/15/18 0508  NA 140  K 5.1  CL 110  CO2 23  GLUCOSE 155*  BUN <5*  CREATININE 0.73  CALCIUM 8.3*   ------------------------------------------------------------------------------------------------------------------  Cardiac Enzymes No results for input(s): TROPONINI in the last 168 hours. ------------------------------------------------------------------------------------------------------------------  RADIOLOGY:  Mr Foot Right Wo Contrast  Result Date: 06/13/2018 CLINICAL DATA:  Worsening right foot infection despite antibiotics started 8 days ago for cellulitis. History of diabetes and neuropathy. Question osteomyelitis. EXAM: MRI OF THE RIGHT FOREFOOT WITHOUT CONTRAST TECHNIQUE: Multiplanar, multisequence MR imaging of the right forefoot was performed. No intravenous contrast was administered. COMPARISON:  Radiographs 06/05/2018. FINDINGS: Bones/Joint/Cartilage No findings highly suspicious for osteomyelitis or septic joint. There are mild degenerative changes of the 1st metatarsophalangeal joint with subchondral cyst formation medially in the  1st metatarsal head. There is mild edema in the tibial sesamoid. No suspicious T1 signal abnormality or cortical destruction. No significant joint effusions. Ligaments The Lisfranc ligament  is intact. Muscles and Tendons Intact forefoot tendons without significant tenosynovitis. No focal muscular abnormalities. 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 Soft tissues Moderate dorsal forefoot subcutaneous edema without focal fluid collection. There appears to be some skin ulceration along the plantar aspect of the 1st metatarsophalangeal joint, suggesting an open wound. There is edema and ill-defined fluid within the underlying subcutaneous fat. Largest fluid component measures approximately 8 mm on image 21/6. No well-defined fluid collections identified on noncontrast imaging. IMPRESSION: 1. Suspected wound involving the plantar aspect of the medial forefoot with underlying inflammation and possible small abscess in the subcutaneous fat. Nonspecific dorsal subcutaneous edema. 2. No evidence of osteomyelitis or septic joint. 3. 1st MTP degenerative changes. Electronically Signed   By: Richardean Sale M.D.   On: 06/13/2018 14:40    EKG:   Orders placed or performed during the hospital encounter of 01/07/18  . EKG 12-Lead  . EKG 12-Lead  . ED EKG  . ED EKG    ASSESSMENT AND PLAN:   72 year old male with past medical history significant for hypertension, non-insulin-dependent diabetes mellitus, anxiety and depression, prostate cancer history presents to hospital secondary to nonhealing right plantar surface wound.  1.  Diabetic foot ulcer with cellulitis-appreciate podiatry consult.  Patient has nonhealing wound on the plantar surface of first metatarsal on the right foot. -Failed outpatient antibiotics with clindamycin. -Appreciate podiatry consult -Status post debridement of the abscess  and wound impregnated with vancomycin.  Cultures are  pending -Continue vancomycin and changed to cefepime instead of zosyn for now -Nonweightbearing on the right forefoot and minimal touch weightbearing on the heel for transfers.  Per podiatry, patient will strongly need rehab at discharge.  2.  Acute on chronic anemia-hemoglobin at 9 -Acute drop with 8 could have been an error yesterday.- -Anemia panel with decent level of iron and B12.  No supplementation needed at this time -No indication for transfusion.  Continue to monitor.  3.  Hypertension-patient on Avapro and Toprol.  Norvasc added today.  Also IV hydralazine PRN  4.  Non-insulin-dependent diabetes mellitus-A1c is 8.9. -Restarted home dose of glimepiride and sliding scale insulin for now  5.  Peripheral neuropathy-continue high-dose of gabapentin  6.  Anxiety and depression-on Paxil and Xanax  7.  DVT prophylaxis-Lovenox  Physical therapy consulted today    All the records are reviewed and case discussed with Care Management/Social Workerr. Management plans discussed with the patient, family and they are in agreement.  CODE STATUS: Full code  TOTAL TIME TAKING CARE OF THIS PATIENT: 37 minutes.   POSSIBLE D/C IN 2-3 DAYS, DEPENDING ON CLINICAL CONDITION.   Gladstone Lighter M.D on 06/15/2018 at 1:04 PM  Between 7am to 6pm - Pager - [phone]  After 6pm go to [email or web address] - password [email or web address] [place] [facility]  Office  [phone]  CC: Primary care physician; Adin Hector, MD

## 2018-06-15 NOTE — Progress Notes (Signed)
1 Day Post-Op   Subjective/Chief Complaint: Patient seen.  Some pain in the right foot.   Objective: Vital signs in last 24 hours: Temp:  [97.6 F (36.4 C)-98.3 F (36.8 C)] 97.6 F (36.4 C) (12/28 0805) Pulse Rate:  [58-75] 58 (12/28 0805) Resp:  [14-20] 14 (12/27 2327) BP: (122-188)/(56-73) 188/73 (12/28 0805) SpO2:  [97 %-100 %] 100 % (12/28 0805) Weight:  [94.9 kg] 94.9 kg (12/28 0500) Last BM Date: 06/14/18  Intake/Output from previous day: 12/27 0701 - 12/28 0700 In: 3079.2 [P.O.:960; I.V.:1455.1; IV Piggyback:664.1] Out: 500 [Urine:500] Intake/Output this shift: Total I/O In: 1328.2 [I.V.:1010.6; IV Piggyback:317.5] Out: 580 [Urine:580]  Moderate bleeding with minimal drainage on the bandaging.  No purulence is noted.  Incisions are well coapted.  Significant reduction in erythema and edema.  Lab Results:  Recent Labs    06/14/18 0308 06/15/18 0508  WBC 7.0 9.5  HGB 8.0* 9.4*  HCT 26.7* 30.8*  PLT 295 288   BMET Recent Labs    06/14/18 0308 06/15/18 0508  NA 140 140  K 3.1* 5.1  CL 105 110  CO2 28 23  GLUCOSE 175* 155*  BUN <5* <5*  CREATININE 0.77 0.73  CALCIUM 8.0* 8.3*   PT/INR No results for input(s): LABPROT, INR in the last 72 hours. ABG No results for input(s): PHART, HCO3 in the last 72 hours.  Invalid input(s): PCO2, PO2  Studies/Results: Mr Foot Right Wo Contrast  Result Date: 06/13/2018 CLINICAL DATA:  Worsening right foot infection despite antibiotics started 8 days ago for cellulitis. History of diabetes and neuropathy. Question osteomyelitis. EXAM: MRI OF THE RIGHT FOREFOOT WITHOUT CONTRAST TECHNIQUE: Multiplanar, multisequence MR imaging of the right forefoot was performed. No intravenous contrast was administered. COMPARISON:  Radiographs 06/05/2018. FINDINGS: Bones/Joint/Cartilage No findings highly suspicious for osteomyelitis or septic joint. There are mild degenerative changes of the 1st metatarsophalangeal joint with  subchondral cyst formation medially in the 1st metatarsal head. There is mild edema in the tibial sesamoid. No suspicious T1 signal abnormality or cortical destruction. No significant joint effusions. Ligaments The Lisfranc ligament is intact. Muscles and Tendons Intact forefoot tendons without significant tenosynovitis. No focal muscular abnormalities. 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 Soft tissues Moderate dorsal forefoot subcutaneous edema without focal fluid collection. There appears to be some skin ulceration along the plantar aspect of the 1st metatarsophalangeal joint, suggesting an open wound. There is edema and ill-defined fluid within the underlying subcutaneous fat. Largest fluid component measures approximately 8 mm on image 21/6. No well-defined fluid collections identified on noncontrast imaging. IMPRESSION: 1. Suspected wound involving the plantar aspect of the medial forefoot with underlying inflammation and possible small abscess in the subcutaneous fat. Nonspecific dorsal subcutaneous edema. 2. No evidence of osteomyelitis or septic joint. 3. 1st MTP degenerative changes. Electronically Signed   By: Richardean Sale M.D.   On: 06/13/2018 14:40    Anti-infectives: Anti-infectives (From admission, onward)   Start     Dose/Rate Route Frequency Ordered Stop   06/15/18 1400  vancomycin (VANCOCIN) IVPB 1000 mg/200 mL premix     1,000 mg 200 mL/hr over 60 Minutes Intravenous Every 12 hours 06/15/18 1026     06/13/18 2200  vancomycin (VANCOCIN) 1,250 mg in sodium chloride 0.9 % 250 mL IVPB  Status:  Discontinued     1,250 mg 166.7 mL/hr over 90 Minutes Intravenous Every 12 hours 06/13/18 1324 06/15/18 1026   06/13/18 1400  piperacillin-tazobactam (ZOSYN) IVPB 3.375 g  Status:  Discontinued     3.375 g 12.5 mL/hr over 240 Minutes Intravenous  Once 06/13/18 1238 06/13/18 1318   06/13/18 1400  piperacillin-tazobactam (ZOSYN) IVPB 3.375 g      3.375 g 12.5 mL/hr over 240 Minutes Intravenous Every 8 hours 06/13/18 1318     06/13/18 1330  vancomycin (VANCOCIN) IVPB 1000 mg/200 mL premix     1,000 mg 200 mL/hr over 60 Minutes Intravenous  Once 06/13/18 1238 06/13/18 1610      Assessment/Plan: s/p Procedure(s): IRRIGATION AND DEBRIDEMENT FOOT (Right) Assessment: Betadine and a sterile dressing reapplied to the right foot.  If everything remains stable he should be okay for transfer to skilled nursing in a couple of days.  I would recommend skilled nursing for evaluation of the wound and help as he will need to be essentially nonweightbearing on the right foot with pressure only on the heel for transfer.  Plan for follow-up in a couple of weeks outpatient for suture removal.  LOS: 2 days    Durward Fortes 06/15/2018

## 2018-06-15 NOTE — Care Management (Signed)
Barrier-PT pending once patient is able to bear weight on operated foot. Per SW patients preference is SNF but open to home health if that is the recomendation. Patient has home health list at bedside. RNCM team will assist with disposition as needed and discuss DME needs and home health needs should that become the disposition.

## 2018-06-15 NOTE — Plan of Care (Signed)
  Problem: Clinical Measurements: Goal: Ability to avoid or minimize complications of infection will improve Outcome: Progressing   Problem: Skin Integrity: Goal: Skin integrity will improve Outcome: Progressing   Problem: Education: Goal: Knowledge of General Education information will improve Description: Including pain rating scale, medication(s)/side effects and non-pharmacologic comfort measures Outcome: Progressing   

## 2018-06-15 NOTE — Consult Note (Addendum)
Pharmacy Antibiotic Note  Mike [name] is a 72 y.o. male admitted on 06/13/2018 with sepsis d/t  Diabetic foot ulcer with cellulitis. Pharmacy has been consulted for vancomycin and Zosyn dosing. There is no recent history of vancomycin therapy to guide dosing. Since yesterday's note his renal function is stable. S/P incision and debridement of foot 12/27.   12/26 Vancomycin 1250mg  IV every 12 hours started.   12/28 Pharmacy consulted for cefepime dosing. Zosyn has been discontinued.    Plan: 1) 12/28 Vanc trough 21. Level is slightly supratherapeutic. Will adjust dose to Vancomycin 1000mg  IV every 12 hours. Goal trough 15-20 mcg/mL. Trough level prior to 4th dose.    2) Start Cefepime 2g IV every 12 hours.   Height: 5\' 7"  (170.2 cm) Weight: 209 lb 3 oz (94.9 kg) IBW/kg (Calculated) : 66.1  Temp (24hrs), Avg:98 F (36.7 C), Min:97.6 F (36.4 C), Max:98.3 F (36.8 C)  Recent Labs  Lab 06/13/18 1243 06/14/18 0308 06/15/18 0508 06/15/18 0937  WBC 8.9 7.0 9.5  --   CREATININE 0.82 0.77 0.73  --   VANCOTROUGH  --   --   --  21*    Estimated Creatinine Clearance: 90.3 mL/min (by C-G formula based on SCr of 0.73 mg/dL).    Antimicrobials this admission: vancomycin 12/26 >>  Zosyn 12/26 >> 12/28 Cefepime 12/28>>   Microbiology results: 12/27 WCx pending 12/26 WCx rare GPC 12/26 BCx pending 12/26 MRSA PCR (-)  Thank you for allowing pharmacy to be a part of this patient's care.  Pernell Dupre, PharmD, BCPS Clinical Pharmacist 06/15/2018 10:24 AM

## 2018-06-16 LAB — GLUCOSE, CAPILLARY
Glucose-Capillary: 169 mg/dL — ABNORMAL HIGH (ref 70–99)
Glucose-Capillary: 113 mg/dL — ABNORMAL HIGH (ref 70–99)
Glucose-Capillary: 224 mg/dL — ABNORMAL HIGH (ref 70–99)
Glucose-Capillary: 281 mg/dL — ABNORMAL HIGH (ref 70–99)

## 2018-06-16 LAB — BASIC METABOLIC PANEL
Anion gap: 8 (ref 5–15)
BUN: <5 mg/dL — ABNORMAL LOW (ref 8–23)
CO2: 28 mmol/L (ref 22–32)
CREATININE: 0.81 mg/dL (ref 0.61–1.24)
Calcium: 8.8 mg/dL — ABNORMAL LOW (ref 8.9–10.3)
Chloride: 103 mmol/L (ref 98–111)
GFR calc Af Amer: >60 mL/min (ref >60)
GFR calc non Af Amer: >60 mL/min (ref >60)
Glucose, Bld: 150 mg/dL — ABNORMAL HIGH (ref 70–99)
Potassium: 3.7 mmol/L (ref 3.5–5.1)
Sodium: 139 mmol/L (ref 135–145)

## 2018-06-16 MED ORDER — ENSURE ENLIVE PO LIQD
237.0000 mL | Freq: Two times a day (BID) | ORAL | Status: DC
  Administered 2018-06-16 – 2018-06-17 (×2): 237 mL via ORAL

## 2018-06-16 MED ORDER — AMLODIPINE BESYLATE 10 MG PO TABS
10.0000 mg | ORAL_TABLET | Freq: Every day | ORAL | Status: DC
  Administered 2018-06-17: 10 mg via ORAL
  Filled 2018-06-16: qty 1

## 2018-06-16 MED ORDER — LOPERAMIDE HCL 2 MG PO CAPS
4.0000 mg | ORAL_CAPSULE | Freq: Four times a day (QID) | ORAL | Status: DC | PRN
  Filled 2018-06-16: qty 2

## 2018-06-16 NOTE — Plan of Care (Signed)
  Problem: Health Behavior/Discharge Planning: Goal: Ability to manage health-related needs will improve Outcome: Progressing   Problem: Clinical Measurements: Goal: Ability to maintain clinical measurements within normal limits will improve Outcome: Progressing Goal: Will remain free from infection Outcome: Progressing Goal: Diagnostic test results will improve Outcome: Progressing Goal: Cardiovascular complication will be avoided Outcome: Progressing   Problem: Activity: Goal: Risk for activity intolerance will decrease Outcome: Progressing   Problem: Nutrition: Goal: Adequate nutrition will be maintained Outcome: Progressing

## 2018-06-16 NOTE — Evaluation (Signed)
Physical Therapy Evaluation Patient Details Name: Mike [name] MRN: [id number] DOB: April 05, 1946 Today's Date: 06/16/2018   History of Present Illness  72 y.o. male with a known history of epididymitis type II, neuropathy, hypertension, has been dealing with worsening right foot infection.  Admitted with worsening cellulitis and is s/p R foot I&D 12/28.  Clinical Impression  Pt did reasonably well with mobility and transfer to recliner, but showed some impulsivity and despite cuing to minimize WBing though R LE (heel via orthowedge only) he did not seem to show a lot of effort in maintaining this.  He did show good effort with tasks and was eager to participate but needed constant cuing and close guidance to insure safety.     Follow Up Recommendations SNF    Equipment Recommendations  (TBD at rehab, likely will need walker)    Recommendations for Other Services       Precautions / Restrictions Precautions Precautions: Fall Required Braces or Orthoses: (R foot orthowedge) Restrictions Weight Bearing Restrictions: Yes RLE Weight Bearing: Non weight bearing(per surgeon, heel WBing (with boot) okay with transfers)      Mobility  Bed Mobility Overal bed mobility: Modified Independent             General bed mobility comments: Pt was able to swing around to EOB w/o direct assist, though he did need some cuing and reminders for safety and general awareness  Transfers Overall transfer level: Needs assistance Equipment used: Rolling walker (2 wheeled) Transfers: Sit to/from Stand Sit to Stand: Min assist         General transfer comment: despite heavy cuing to try to minimize WBing on the R he clearly did. Phyiscal assist to shift weight forward and get to standing, able to maintain balance once upright with heavy walker use.  Did show some effort to decrease R heel WBing  Ambulation/Gait             General Gait Details: Transfer to the recliner on bed, some heel  WBing through orthowedge, no LOBs but impulsive and needing close supervision  Stairs            Wheelchair Mobility    Modified Rankin (Stroke Patients Only)       Balance Overall balance assessment: Needs assistance Sitting-balance support: No upper extremity supported Sitting balance-Leahy Scale: Good Sitting balance - Comments: Pt with good sitting balance   Standing balance support: Bilateral upper extremity supported Standing balance-Leahy Scale: Fair Standing balance comment: reliance on walker, despite cuing to minimize he putting some weight through R LE                             Pertinent Vitals/Pain Pain Assessment: No/denies pain    Home Living Family/patient expects to be discharged to:: Skilled nursing facility Living Arrangements: Alone                    Prior Function Level of Independence: Independent         Comments: Pt reports that he has been able to manage relatively well at home     Hand Dominance        Extremity/Trunk Assessment   Upper Extremity Assessment Upper Extremity Assessment: Overall WFL for tasks assessed    Lower Extremity Assessment Lower Extremity Assessment: Overall WFL for tasks assessed(lacks full TKE b/l)       Communication   Communication: No difficulties  Cognition Arousal/Alertness: Awake/alert  Behavior During Therapy: [facility] for tasks assessed/performed;Impulsive Overall Cognitive Status: Within Functional Limits for tasks assessed                                        General Comments      Exercises     Assessment/Plan    PT Assessment Patient needs continued PT services  PT Problem List Decreased strength;Decreased range of motion;Decreased activity tolerance;Decreased balance;Decreased mobility;Decreased knowledge of use of DME;Decreased safety awareness;Decreased knowledge of precautions;Decreased coordination       PT Treatment Interventions Gait  training;DME instruction;Stair training;Functional mobility training;Therapeutic activities;Balance training;Therapeutic exercise;Neuromuscular re-education;Patient/family education    PT Goals (Current goals can be found in the Care Plan section)  Acute Rehab PT Goals Patient Stated Goal: get back walking, go home PT Goal Formulation: With patient Time For Goal Achievement: 06/30/18 Potential to Achieve Goals: Good    Frequency Min 2X/week   Barriers to discharge        Co-evaluation               AM-PAC PT "6 Clicks" Mobility  Outcome Measure Help needed turning from your back to your side while in a flat bed without using bedrails?: None Help needed moving from lying on your back to sitting on the side of a flat bed without using bedrails?: A Little Help needed moving to and from a bed to a chair (including a wheelchair)?: A Little Help needed standing up from a chair using your arms (e.g., wheelchair or bedside chair)?: A Little Help needed to walk in hospital room?: A Little Help needed climbing 3-5 steps with a railing? : A Lot 6 Click Score: 18    End of Session Equipment Utilized During Treatment: Gait belt Activity Tolerance: Patient tolerated treatment well Patient left: with chair alarm set;with call bell/phone within reach Nurse Communication: Mobility status PT Visit Diagnosis: Muscle weakness (generalized) (M62.81);Difficulty in walking, not elsewhere classified (R26.2);Unsteadiness on feet (R26.81)    Time: 9983-3825 PT Time Calculation (min) (ACUTE ONLY): 29 min   Charges:   PT Evaluation $PT Eval Low Complexity: 1 Low PT Treatments $Therapeutic Activity: 8-22 mins        Kreg Shropshire, DPT 06/16/2018, 12:08 PM

## 2018-06-16 NOTE — Progress Notes (Signed)
[facility] NAME: Mike [name]    MR#:  [id number]  DATE OF BIRTH:  1946-06-02  SUBJECTIVE:  CHIEF COMPLAINT:  No chief complaint on file.  - -Feeling well today.  Not eating food from the hospital. -Wound looks good, dressing changed today  REVIEW OF SYSTEMS:  Review of Systems  Constitutional: Negative for chills, fever and malaise/fatigue.  HENT: Negative for congestion, ear discharge, hearing loss and nosebleeds.   Eyes: Negative for blurred vision, double vision and photophobia.  Respiratory: Negative for cough, shortness of breath and wheezing.   Cardiovascular: Negative for chest pain and palpitations.  Gastrointestinal: Negative for abdominal pain, constipation, diarrhea, nausea and vomiting.  Genitourinary: Negative for dysuria.  Musculoskeletal: Positive for joint pain and myalgias.  Neurological: Negative for dizziness, focal weakness, seizures, weakness and headaches.  Psychiatric/Behavioral: Negative for depression. The patient is not nervous/anxious and does not have insomnia.     DRUG ALLERGIES:   Allergies  Allergen Reactions  . Aleve [Naproxen]   . Motrin [Ibuprofen] Nausea Only    VITALS:  Blood pressure (!) 172/67, pulse (!) 59, temperature 98.2 F (36.8 C), temperature source Oral, resp. rate 18, height 5\' 7"  (1.702 m), weight 95.2 kg, SpO2 100 %.  PHYSICAL EXAMINATION:  Physical Exam   GENERAL:  72 y.o.-year-old elderly patient lying in the bed with no acute distress.  EYES: Pupils equal, round, reactive to light and accommodation. No scleral icterus. Extraocular muscles intact.  HEENT: Head atraumatic, normocephalic. Oropharynx and nasopharynx clear.  NECK:  Supple, no jugular venous distention. No thyroid enlargement, no tenderness.  LUNGS: Normal breath sounds bilaterally, no wheezing, rales,rhonchi or crepitation. No use of accessory muscles of respiration.  Decreased bibasilar breath  sounds CARDIOVASCULAR: S1, S2 normal. No  rubs, or gallops.  3/6 systolic murmur is present ABDOMEN: Soft, nontender, nondistended. Bowel sounds present. No organomegaly or mass.  EXTREMITIES: Right foot in heavy dressing noted.  When dressing opened, he has 2 surgical opening with sutures in place on the right foot plantar surface in the fore foot region. no pedal edema, cyanosis, or clubbing.  NEUROLOGIC: Cranial nerves II through XII are intact. Muscle strength 5/5 in all extremities. Sensation intact. Gait not checked.  PSYCHIATRIC: The patient is alert and oriented x 3.  SKIN: No obvious rash, lesion, or ulcer.    LABORATORY PANEL:   CBC Recent Labs  Lab 06/15/18 0508  WBC 9.5  HGB 9.4*  HCT 30.8*  PLT 288   ------------------------------------------------------------------------------------------------------------------  Chemistries  Recent Labs  Lab 06/16/18 0530  NA 139  K 3.7  CL 103  CO2 28  GLUCOSE 150*  BUN <5*  CREATININE 0.81  CALCIUM 8.8*   ------------------------------------------------------------------------------------------------------------------  Cardiac Enzymes No results for input(s): TROPONINI in the last 168 hours. ------------------------------------------------------------------------------------------------------------------  RADIOLOGY:  No results found.  EKG:   Orders placed or performed during the hospital encounter of 01/07/18  . EKG 12-Lead  . EKG 12-Lead  . ED EKG  . ED EKG    ASSESSMENT AND PLAN:   72 year old male with past medical history significant for hypertension, non-insulin-dependent diabetes mellitus, anxiety and depression, prostate cancer history presents to hospital secondary to nonhealing right plantar surface wound.  1.  Diabetic foot ulcer with cellulitis-appreciate podiatry consult.  Patient has nonhealing wound on the plantar surface of first metatarsal on the right foot. -Failed outpatient antibiotics  with clindamycin. -Appreciate podiatry consult -Status post debridement of the abscess  and  wound impregnated with vancomycin.  Cultures are pending -Continue vancomycin and changed to cefepime instead of zosyn for now -Nonweightbearing on the right forefoot and minimal touch weightbearing on the heel for transfers.  Per podiatry, patient will strongly need rehab at discharge. -Daily dressing changes.  Wound healing well per podiatry. -Likely discharge to rehab tomorrow  2.  Acute on chronic anemia-hemoglobin at 9 - stable hb -No indication for transfusion.  Continue to monitor.  3.  Hypertension-patient on Avapro and Toprol.  Norvasc added .  Also IV hydralazine PRN  4.  Non-insulin-dependent diabetes mellitus-A1c is 8.9. -Restarted home dose of glimepiride and sliding scale insulin for now  5.  Peripheral neuropathy-continue high-dose of gabapentin  6.  Anxiety and depression-on Paxil and Xanax  7.  DVT prophylaxis-Lovenox  Physical therapy consulted -rehab at discharge    All the records are reviewed and case discussed with Care Management/Social Workerr. Management plans discussed with the patient, family and they are in agreement.  CODE STATUS: Full code  TOTAL TIME TAKING CARE OF THIS PATIENT: 37 minutes.   POSSIBLE D/C IN 1-2 DAYS, DEPENDING ON CLINICAL CONDITION.   Gladstone Lighter M.D on 06/16/2018 at 2:17 PM  Between 7am to 6pm - Pager - [phone]  After 6pm go to [email or web address] - password [email or web address] [facility]  Office  [phone]  CC: Primary care physician; Adin Hector, MD

## 2018-06-16 NOTE — Progress Notes (Signed)
Daily Progress Note   Subjective  - 2 Days Post-Op  Right foot I&D.  Objective Vitals:   06/15/18 1938 06/15/18 2342 06/16/18 0500 06/16/18 0754  BP: (!) 169/73 (!) 181/68  (!) 172/67  Pulse: 70 70  (!) 59  Resp: 18 19  18   Temp: 97.8 F (36.6 C) 98.5 F (36.9 C)  98.2 F (36.8 C)  TempSrc: Oral Oral  Oral  SpO2: 94% 98%  100%  Weight:   95.2 kg   Height:        Physical Exam: Incisions intact.  Medial and plantar first MTPJ incisions noted.  There was a scant amount of mostly bloody with slight purulent change drainage today.  No severe purulence.  Minimal to no erythema surrounding the wounds at all.  A small plantar draining site of the incision was noted and packed open today.  No lymphangitic streaking at all.  Laboratory CBC    Component Value Date/Time   WBC 9.5 06/15/2018 0508   HGB 9.4 (L) 06/15/2018 0508   HCT 30.8 (L) 06/15/2018 0508   PLT 288 06/15/2018 0508    BMET    Component Value Date/Time   NA 139 06/16/2018 0530   K 3.7 06/16/2018 0530   CL 103 06/16/2018 0530   CO2 28 06/16/2018 0530   GLUCOSE 150 (H) 06/16/2018 0530   BUN <5 (L) 06/16/2018 0530   CREATININE 0.81 06/16/2018 0530   CALCIUM 8.8 (L) 06/16/2018 0530   GFRNONAA >60 06/16/2018 0530   GFRAA >60 06/16/2018 0530    Assessment/Planning: Abscess right foot status post I&D   Dressing changed today.  I packed the plantar wound with iodoform packing today.  Would recommend packing for the next few days.  From podiatry standpoint patient is stable for discharge to skilled facility.  Will require daily dressing changes and nonweightbearing to the right foot.  Okay for heel weightbearing for transfer only.  Dressing changes: Cleanse wound daily.  Pack plantar incision site with iodoform packing and cover with bulky sterile dressing x1 week.  Once all drainage has stopped can change to daily dry dressing changes thereafter.  Follow-up with podiatry in 2 weeks.  Samara Deist  A  06/16/2018, 9:46 AM

## 2018-06-16 NOTE — Plan of Care (Signed)
  Problem: Clinical Measurements: Goal: Ability to avoid or minimize complications of infection will improve Outcome: Progressing   Problem: Skin Integrity: Goal: Skin integrity will improve Outcome: Progressing   Problem: Education: Goal: Knowledge of General Education information will improve Description Including pain rating scale, medication(s)/side effects and non-pharmacologic comfort measures Outcome: Progressing   Problem: Health Behavior/Discharge Planning: Goal: Ability to manage health-related needs will improve Outcome: Progressing   Problem: Clinical Measurements: Goal: Ability to maintain clinical measurements within normal limits will improve Outcome: Progressing Goal: Will remain free from infection Outcome: Progressing Goal: Diagnostic test results will improve Outcome: Progressing Goal: Respiratory complications will improve Outcome: Progressing Goal: Cardiovascular complication will be avoided Outcome: Progressing   Problem: Activity: Goal: Risk for activity intolerance will decrease Outcome: Progressing   Problem: Nutrition: Goal: Adequate nutrition will be maintained Outcome: Progressing   Problem: Coping: Goal: Level of anxiety will decrease Outcome: Progressing   Problem: Elimination: Goal: Will not experience complications related to bowel motility Outcome: Progressing Goal: Will not experience complications related to urinary retention Outcome: Progressing   Problem: Pain Managment: Goal: General experience of comfort will improve Outcome: Progressing   Problem: Safety: Goal: Ability to remain free from injury will improve Outcome: Progressing

## 2018-06-17 LAB — BASIC METABOLIC PANEL
Anion gap: 7 (ref 5–15)
BUN: 10 mg/dL (ref 8–23)
CO2: 29 mmol/L (ref 22–32)
Calcium: 8.5 mg/dL — ABNORMAL LOW (ref 8.9–10.3)
Chloride: 101 mmol/L (ref 98–111)
Creatinine, Ser: 1.02 mg/dL (ref 0.61–1.24)
GFR calc Af Amer: >60 mL/min (ref >60)
GFR calc non Af Amer: >60 mL/min (ref >60)
Glucose, Bld: 267 mg/dL — ABNORMAL HIGH (ref 70–99)
Potassium: 3.4 mmol/L — ABNORMAL LOW (ref 3.5–5.1)
Sodium: 137 mmol/L (ref 135–145)

## 2018-06-17 LAB — GLUCOSE, CAPILLARY
Glucose-Capillary: 184 mg/dL — ABNORMAL HIGH (ref 70–99)
Glucose-Capillary: 302 mg/dL — ABNORMAL HIGH (ref 70–99)

## 2018-06-17 LAB — VANCOMYCIN, TROUGH: VANCOMYCIN TR: 24 ug/mL — AB (ref 15–20)

## 2018-06-17 MED ORDER — PHENOBARBITAL 64.8 MG PO TABS: 64.8000 mg | ORAL_TABLET | Freq: Three times a day (TID) | ORAL | Status: DC

## 2018-06-17 MED ORDER — ALPRAZOLAM 1 MG PO TABS: 1.0000 mg | ORAL_TABLET | Freq: Every evening | ORAL | 0 refills | Status: DC | PRN

## 2018-06-17 MED ORDER — SULFAMETHOXAZOLE-TRIMETHOPRIM 800-160 MG PO TABS: 1.0000 | ORAL_TABLET | Freq: Two times a day (BID) | ORAL | 0 refills | Status: AC

## 2018-06-17 MED ORDER — HYDROCODONE-ACETAMINOPHEN 5-325 MG PO TABS: 1.0000 | ORAL_TABLET | Freq: Four times a day (QID) | ORAL | 0 refills | Status: DC | PRN

## 2018-06-17 MED ORDER — VANCOMYCIN HCL IN DEXTROSE 750-5 MG/150ML-% IV SOLN
750.0000 mg | Freq: Two times a day (BID) | INTRAVENOUS | Status: DC
  Administered 2018-06-17: 750 mg via INTRAVENOUS
  Filled 2018-06-17 (×3): qty 150

## 2018-06-17 MED ORDER — RISAQUAD PO CAPS: 1.0000 | ORAL_CAPSULE | Freq: Every day | ORAL | 0 refills | Status: DC

## 2018-06-17 MED ORDER — PAROXETINE HCL 20 MG PO TABS: 20.0000 mg | ORAL_TABLET | Freq: Every day | ORAL | 1 refills | Status: AC

## 2018-06-17 MED ORDER — AMLODIPINE BESYLATE 10 MG PO TABS: 10.0000 mg | ORAL_TABLET | Freq: Every day | ORAL | 2 refills | Status: DC

## 2018-06-17 MED ORDER — ALPRAZOLAM 0.5 MG PO TABS: 0.5000 mg | ORAL_TABLET | Freq: Every day | ORAL | 0 refills | Status: DC

## 2018-06-17 NOTE — Progress Notes (Signed)
Handoff report called to Elmyra Ricks , RN at [facility].

## 2018-06-17 NOTE — Progress Notes (Signed)
Clinical Social Worker (CSW) met with patient and his sister Mike [name] was at bedside. CSW presented bed offers. Patient and sister chose [facility]. Patient is medically stable for D/C to [facility] today. Per Otila Kluver [facility] liaison patient can come today to room 710. RN will call report and arrange EMS for transport. Clinical Education officer, museum (CSW) sent D/C orders to [facility] via [facility]. Patient is aware of above. Patient's sister Mike [name] is aware of above. Please reconsult if future social work needs arise. CSW signing off.   [employer], LCSW [phone]

## 2018-06-17 NOTE — Progress Notes (Signed)
Physical Therapy Treatment Patient Details Name: Mike [name] MRN: [id number] DOB: Oct 23, 1945 Today's Date: 06/17/2018    History of Present Illness 72 y.o. male with a known history of epididymitis type II, neuropathy, hypertension, has been dealing with worsening right foot infection.  Admitted with worsening cellulitis and is s/p R foot I&D 12/28.    PT Comments    [name] continues to demonstrate impulsivity and poor safety awareness with OOB activity so time was spent to improve pt's safety with transfers and short distance ambulation. Pt demonstrated understanding.  Given pt's current mobility status, recommending SNF at d/c.      Follow Up Recommendations  SNF     Equipment Recommendations  (TBD at rehab, likely will need walker)    Recommendations for Other Services       Precautions / Restrictions Precautions Precautions: Fall Required Braces or Orthoses: (R foot orthowedge) Restrictions Weight Bearing Restrictions: Yes RLE Weight Bearing: Non weight bearing(per surgeon, heel WBing (with boot) okay with transfers)    Mobility  Bed Mobility Overal bed mobility: Modified Independent             General bed mobility comments: No physical assist or cues needed.    Transfers Overall transfer level: Needs assistance Equipment used: Rolling walker (2 wheeled) Transfers: Sit to/from Stand Sit to Stand: Min assist         General transfer comment: Pt requires max verbal and tactile cues for proper hand placement, as pt very impulsive and initially using momentum to try to stand without success.  Practiced sit<>stand x3 with pt demonstrating improved stability and safety with practice.    Ambulation/Gait Ambulation/Gait assistance: Min guard Gait Distance (Feet): 5 Feet Assistive device: Rolling walker (2 wheeled) Gait Pattern/deviations: Step-to pattern;Decreased stance time - right;Decreased weight shift to right;Antalgic     General Gait Details: Cues  to slow down as pt takes quick short steps and demonstrates mild instability with use of RW.  Pt adheres to WB precautions.     Stairs             Wheelchair Mobility    Modified Rankin (Stroke Patients Only)       Balance Overall balance assessment: Needs assistance Sitting-balance support: No upper extremity supported;Feet supported Sitting balance-Leahy Scale: Good     Standing balance support: Bilateral upper extremity supported Standing balance-Leahy Scale: Poor Standing balance comment: Pt relies on at least 1UE support for static and dynamic activities                            Cognition Arousal/Alertness: Awake/alert Behavior During Therapy: WFL for tasks assessed/performed;Impulsive Overall Cognitive Status: Within Functional Limits for tasks assessed                                        Exercises General Exercises - Lower Extremity Long Arc Quad: Both;10 reps;Seated Hip Flexion/Marching: Both;10 reps;Seated    General Comments        Pertinent Vitals/Pain Pain Assessment: No/denies pain    Home Living                      Prior Function            PT Goals (current goals can now be found in the care plan section) Acute Rehab PT Goals Patient Stated Goal:  get back walking, go home PT Goal Formulation: With patient Time For Goal Achievement: 06/30/18 Potential to Achieve Goals: Good Progress towards PT goals: Progressing toward goals    Frequency    Min 2X/week      PT Plan Current plan remains appropriate    Co-evaluation              AM-PAC PT "6 Clicks" Mobility   Outcome Measure  Help needed turning from your back to your side while in a flat bed without using bedrails?: None Help needed moving from lying on your back to sitting on the side of a flat bed without using bedrails?: A Little Help needed moving to and from a bed to a chair (including a wheelchair)?: A Little Help needed  standing up from a chair using your arms (e.g., wheelchair or bedside chair)?: A Little Help needed to walk in hospital room?: A Little Help needed climbing 3-5 steps with a railing? : A Lot 6 Click Score: 18    End of Session Equipment Utilized During Treatment: Gait belt Activity Tolerance: Patient tolerated treatment well Patient left: with chair alarm set;with call bell/phone within reach;in chair;with family/visitor present Nurse Communication: Mobility status PT Visit Diagnosis: Muscle weakness (generalized) (M62.81);Difficulty in walking, not elsewhere classified (R26.2);Unsteadiness on feet (R26.81)     Time: 6701-4103 PT Time Calculation (min) (ACUTE ONLY): 29 min  Charges:  $Gait Training: 8-22 mins $Therapeutic Activity: 8-22 mins                     Collie Siad PT, DPT 06/17/2018, 11:24 AM

## 2018-06-17 NOTE — Care Management Important Message (Signed)
Important Message  Patient Details  Name: Mike [name] MRN: [id number] Date of Birth: 01-Nov-1945   Medicare Important Message Given:  Yes    Juliann Pulse A Dillard Pascal 06/17/2018, 10:30 AM

## 2018-06-17 NOTE — Clinical Social Work Placement (Signed)
   CLINICAL SOCIAL WORK PLACEMENT  NOTE  Date:  06/17/2018  Patient Details  Name: Mike [name] MRN: [id number] Date of Birth: 08-Apr-1946  Clinical Social Work is seeking post-discharge placement for this patient at the [place] level of care (*CSW will initial, date and re-position this form in  chart as items are completed):  Yes   Patient/family provided with [place] Work Department's list of facilities offering this level of care within the geographic area requested by the patient (or if unable, by the patient's family).  Yes   Patient/family informed of their freedom to choose among providers that offer the needed level of care, that participate in Medicare, Medicaid or managed care program needed by the patient, have an available bed and are willing to accept the patient.  Yes   Patient/family informed of 's ownership interest in [facility] and [facility], as well as of the fact that they are under no obligation to receive care at these facilities.  PASRR submitted to EDS on 06/14/18     PASRR number received on 06/14/18     Existing PASRR number confirmed on       FL2 transmitted to all facilities in geographic area requested by pt/family on 06/14/18     FL2 transmitted to all facilities within larger geographic area on       Patient informed that his/her managed care company has contracts with or will negotiate with certain facilities, including the following:        Yes   Patient/family informed of bed offers received.  Patient chooses bed at ([facility] )     Physician recommends and patient chooses bed at      Patient to be transferred to ([facility] ) on 06/17/18.  Patient to be transferred to facility by [facility] )     Patient family notified on 06/17/18 of transfer.  Name of family member notified:  (Patient's sister Jenny Reichmann is aware of D/C today. )     PHYSICIAN       Additional Comment:     _______________________________________________ Lorn Butcher, Veronia Beets, LCSW 06/17/2018, 11:23 AM

## 2018-06-17 NOTE — Discharge Summary (Signed)
[facility] NAME: Mike [name]    MR#:  [id number]  DATE OF BIRTH:  [birth year]  DATE OF ADMISSION:  06/13/2018   ADMITTING PHYSICIAN: Nicholes Mango, MD  DATE OF DISCHARGE:  06/17/18  PRIMARY CARE PHYSICIAN: Tama High III, MD   ADMISSION DIAGNOSIS:   Diabetic foot abscess Syncope  DISCHARGE DIAGNOSIS:   Active Problems:   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 ([place])   SECONDARY DIAGNOSIS:   Past Medical History:  Diagnosis Date  . Anxiety   . Diabetes mellitus without complication ([place])   . Epilepsy ([place])   . High cholesterol   . Hypertension   . Prostate cancer [facility]) 2004   prostate removed    HOSPITAL COURSE:   72 year old male with past medical history significant for hypertension, non-insulin-dependent diabetes mellitus, anxiety and depression, prostate cancer history presents to hospital secondary to nonhealing right plantar surface wound.  1.  Diabetic foot ulcer with cellulitis-appreciate podiatry consult.  Patient has nonhealing wound on the plantar surface of first metatarsal on the right foot. -Failed outpatient antibiotics with clindamycin. -Appreciate podiatry consult -Status post debridement of the abscess  and wound impregnated with vancomycin.  Cultures are growing gram positive cocci and no anerobes -received IV vancomycin and  Cefepime here - discharge on oral bactrim -Nonweightbearing on the right forefoot and minimal touch weightbearing on the heel for transfers.  Per podiatry, patient will strongly need rehab at discharge. -Daily dressing changes.  Wound healing well per podiatry.  -Dressing changes: Cleanse wound daily.  Pack plantar incision site with iodoform packing and cover with bulky sterile dressing x1 week.  Once all drainage has stopped can change to daily dry dressing changes thereafter. Follow-up with podiatry in 2 weeks.  2.  Acute on chronic anemia-hemoglobin at 9 - stable hb -No  indication for transfusion.  Continue to monitor.  3.  Hypertension-patient on Avapro and atenelol.  Norvasc added .    4.  Non-insulin-dependent diabetes mellitus-A1c is 8.9. -Restarted home dose of glimepiride   5.  Peripheral neuropathy-continue high-dose of gabapentin  6.  Anxiety and depression-on Paxil and Xanax    Physical therapy consulted -rehab at discharge [facility]   DISCHARGE CONDITIONS:   Guarded  CONSULTS OBTAINED:   Treatment Team:  Sharlotte Alamo, DPM  DRUG ALLERGIES:   Allergies  Allergen Reactions  . Aleve [Naproxen]   . Motrin [Ibuprofen] Nausea Only   DISCHARGE MEDICATIONS:   Allergies as of 06/17/2018      Reactions   Aleve [naproxen]    Motrin [ibuprofen] Nausea Only      Medication List    STOP taking these medications   clindamycin 300 MG capsule Commonly known as:  CLEOCIN     TAKE these medications   acidophilus Caps capsule Take 1 capsule by mouth daily.   ALPRAZolam 1 MG tablet Commonly known as:  XANAX Take 1 tablet (1 mg total) by mouth at bedtime as needed for anxiety. What changed:    medication strength  how much to take   ALPRAZolam 0.5 MG tablet Commonly known as:  XANAX Take 1 tablet (0.5 mg total) by mouth daily. Start taking on:  June 18, 2018 What changed:  You were already taking a medication with the same name, and this prescription was added. Make sure you understand how and when to take each.   amLODipine 10 MG tablet Commonly known as:  NORVASC Take 1 tablet (  10 mg total) by mouth daily. Start taking on:  June 18, 2018   atenolol 50 MG tablet Commonly known as:  TENORMIN Take 50 mg by mouth daily.   atorvastatin 20 MG tablet Commonly known as:  LIPITOR Take 20 mg by mouth daily.   gabapentin 300 MG capsule Commonly known as:  NEURONTIN Take 300 mg by mouth daily.   gabapentin 300 MG capsule Commonly known as:  NEURONTIN Take 1,200 mg by mouth  at bedtime.   glimepiride 4 MG tablet Commonly known as:  AMARYL Take 4 mg by mouth daily with breakfast.   HYDROcodone-acetaminophen 5-325 MG tablet Commonly known as:  NORCO/VICODIN Take 1-2 tablets by mouth every 6 (six) hours as needed for moderate pain or severe pain.   irbesartan 150 MG tablet Commonly known as:  AVAPRO Take 150 mg by mouth daily.   PARoxetine 20 MG tablet Commonly known as:  PAXIL Take 1 tablet (20 mg total) by mouth daily. Start taking on:  June 18, 2018   PHENobarbital 64.8 MG tablet Commonly known as:  LUMINAL Take 1 tablet (64.8 mg total) by mouth 3 (three) times daily.   sulfamethoxazole-trimethoprim 800-160 MG tablet Commonly known as:  BACTRIM DS,SEPTRA DS Take 1 tablet by mouth 2 (two) times daily for 14 days.        DISCHARGE INSTRUCTIONS:   1. PCP f/u in 1-2 weeks 2. Podiatry f/u in 1-2 weeks  DIET:   Cardiac diet  ACTIVITY:   Activity as tolerated  OXYGEN:   Home Oxygen: No.  Oxygen Delivery: room air  DISCHARGE LOCATION:   nursing home   If you experience worsening of your admission symptoms, develop shortness of breath, life threatening emergency, suicidal or homicidal thoughts you must seek medical attention immediately by calling 911 or calling your MD immediately  if symptoms less severe.  You Must read complete instructions/literature along with all the possible adverse reactions/side effects for all the Medicines you take and that have been prescribed to you. Take any new Medicines after you have completely understood and accpet all the possible adverse reactions/side effects.   Please note  You were cared for by a hospitalist during your hospital stay. If you have any questions about your discharge medications or the care you received while you were in the hospital after you are discharged, you can call the unit and asked to speak with the hospitalist on call if the hospitalist that took care of you is not  available. Once you are discharged, your primary care physician will handle any further medical issues. Please note that NO REFILLS for any discharge medications will be authorized once you are discharged, as it is imperative that you return to your primary care physician (or establish a relationship with a primary care physician if you do not have one) for your aftercare needs so that they can reassess your need for medications and monitor your lab values.    On the day of Discharge:  VITAL SIGNS:   Blood pressure (!) 170/70, pulse 60, temperature 98 F (36.7 C), temperature source Oral, resp. rate 19, height 5\' 7"  (1.702 m), weight 95.6 kg, SpO2 99 %.  PHYSICAL EXAMINATION:   GENERAL:  72 y.o.-year-old elderly patient lying in the bed with no acute distress.  EYES: Pupils equal, round, reactive to light and accommodation. No scleral icterus. Extraocular muscles intact.  HEENT: Head atraumatic, normocephalic. Oropharynx and nasopharynx clear.  NECK:  Supple, no jugular venous distention. No thyroid enlargement, no tenderness.  LUNGS: Normal breath sounds bilaterally, no wheezing, rales,rhonchi or crepitation. No use of accessory muscles of respiration.  Decreased bibasilar breath sounds CARDIOVASCULAR: S1, S2 normal. No  rubs, or gallops.  3/6 systolic murmur is present ABDOMEN: Soft, nontender, nondistended. Bowel sounds present. No organomegaly or mass.  EXTREMITIES: Right foot in heavy dressing noted.  When dressing opened, he has 2 surgical opening with sutures in place on the right foot plantar surface in the fore foot region. no pedal edema, cyanosis, or clubbing.  NEUROLOGIC: Cranial nerves II through XII are intact. Muscle strength 5/5 in all extremities. Sensation intact. Gait not checked.  PSYCHIATRIC: The patient is alert and oriented x 3.  SKIN: No obvious rash, lesion, or ulcer.    DATA REVIEW:   CBC Recent Labs  Lab 06/15/18 0508  WBC 9.5  HGB 9.4*  HCT 30.8*  PLT 288      Chemistries  Recent Labs  Lab 06/17/18 0119  NA 137  K 3.4*  CL 101  CO2 29  GLUCOSE 267*  BUN 10  CREATININE 1.02  CALCIUM 8.5*     Microbiology Results  Results for orders placed or performed during the hospital encounter of 06/13/18  Surgical PCR screen     Status: None   Collection Time: 06/13/18 12:38 PM  Result Value Ref Range Status   MRSA, PCR NEGATIVE NEGATIVE Final   Staphylococcus aureus NEGATIVE NEGATIVE Final    Comment: (NOTE) The Xpert SA Assay (FDA approved for NASAL specimens in patients 13 years of age and older), is one component of a comprehensive surveillance program. It is not intended to diagnose infection nor to guide or monitor treatment. Performed at [facility], [address] [place], [place] [address]   CULTURE, BLOOD (ROUTINE X 2) w Reflex to ID Panel     Status: None (Preliminary result)   Collection Time: 06/13/18  1:32 PM  Result Value Ref Range Status   Specimen Description BLOOD LEFT ANTECUBITAL  Final   Special Requests   Final    BOTTLES DRAWN AEROBIC AND ANAEROBIC Blood Culture adequate volume   Culture   Final    NO GROWTH 4 DAYS Performed at [facility], [address] [place], [place] [address]    Report Status PENDING  Incomplete  CULTURE, BLOOD (ROUTINE X 2) w Reflex to ID Panel     Status: None (Preliminary result)   Collection Time: 06/13/18  1:43 PM  Result Value Ref Range Status   Specimen Description BLOOD BLOOD LEFT HAND  Final   Special Requests   Final    BOTTLES DRAWN AEROBIC AND ANAEROBIC Blood Culture adequate volume   Culture   Final    NO GROWTH 4 DAYS Performed at [facility], [address], [place], [place] [address]    Report Status PENDING  Incomplete  Aerobic/Anaerobic Culture (surgical/deep wound)     Status: None (Preliminary result)   Collection Time: 06/13/18  5:27 PM  Result Value Ref Range Status   Specimen Description   Final    FOOT RIGHT Performed at  [facility], [place] [address], [place], [place] [address]    Special Requests   Final    NONE Performed at [facility], [place], [place] [address]    Gram Stain   Final    ABUNDANT WBC PRESENT, PREDOMINANTLY PMN RARE GRAM POSITIVE COCCI Performed at [facility], [place] [address] [place],  [address]  Culture   Final    FEW MULTIPLE ORGANISMS PRESENT, NONE PREDOMINANT NO ANAEROBES ISOLATED; CULTURE IN PROGRESS FOR 5 DAYS    Report Status PENDING  Incomplete  Aerobic/Anaerobic Culture (surgical/deep wound)     Status: None (Preliminary result)   Collection Time: 06/14/18  7:53 AM  Result Value Ref Range Status   Specimen Description   Final    WOUND Performed at [facility], [address], [place], [place] [address]    Special Requests   Final    RT FOOT Performed at [facility], [address] [place], [place] [address]    Gram Stain   Final    FEW WBC PRESENT, PREDOMINANTLY PMN RARE GRAM POSITIVE COCCI    Culture   Final    FEW MULTIPLE ORGANISMS PRESENT, NONE PREDOMINANT CULTURE REINCUBATED FOR BETTER GROWTH Performed at [facility], [place] [address], [place], [place] [address]    Report Status PENDING  Incomplete    RADIOLOGY:  No results found.   Management plans discussed with the patient, family and they are in agreement.  CODE STATUS:     Code Status Orders  (From admission, onward)         Start     Ordered   06/13/18 1224  Full code  Continuous     06/13/18 1225        Code Status History    This patient has a current code status but no historical code status.      TOTAL TIME TAKING CARE OF THIS PATIENT: 38 minutes.    Gladstone Lighter M.D on 06/17/2018 at 9:29 AM  Between 7am to 6pm - Pager - [phone]  After 6pm go to [email or web address] - password [email or web address] [facility]  [facility]  Office  [phone]  CC: Primary care physician; Adin Hector, MD   Note: This dictation was prepared with Dragon dictation along with smaller phrase technology. Any transcriptional errors that result from this process are unintentional.

## 2018-06-17 NOTE — Plan of Care (Signed)
  Problem: Health Behavior/Discharge Planning: Goal: Ability to manage health-related needs will improve Outcome: Progressing   Problem: Clinical Measurements: Goal: Ability to maintain clinical measurements within normal limits will improve Outcome: Progressing Goal: Will remain free from infection Outcome: Progressing Goal: Diagnostic test results will improve Outcome: Progressing Goal: Cardiovascular complication will be avoided Outcome: Progressing   Problem: Activity: Goal: Risk for activity intolerance will decrease Outcome: Progressing   Problem: Nutrition: Goal: Adequate nutrition will be maintained Outcome: Progressing   Problem: Elimination: Goal: Will not experience complications related to bowel motility Outcome: Progressing Goal: Will not experience complications related to urinary retention Outcome: Progressing   Problem: Pain Managment: Goal: General experience of comfort will improve Outcome: Progressing   Problem: Safety: Goal: Ability to remain free from injury will improve Outcome: Progressing   Problem: Skin Integrity: Goal: Risk for impaired skin integrity will decrease Outcome: Progressing   Problem: Education: Goal: Required Educational Video(s) Outcome: Progressing   Problem: Clinical Measurements: Goal: Postoperative complications will be avoided or minimized Outcome: Progressing   Problem: Skin Integrity: Goal: Demonstration of wound healing without infection will improve Outcome: Progressing

## 2018-06-17 NOTE — Consult Note (Addendum)
Pharmacy Antibiotic Note  Mike [name] is a 72 y.o. male admitted on 06/13/2018 with sepsis d/t  Diabetic foot ulcer with cellulitis. Pharmacy has been consulted for vancomycin and Zosyn dosing. There is no recent history of vancomycin therapy to guide dosing. Since yesterday's note his renal function is stable. S/P incision and debridement of foot 12/27.   12/26 Vancomycin 1250mg  IV every 12 hours started.   12/28 Pharmacy consulted for cefepime dosing. Zosyn has been discontinued.    Plan: 12/30 @ 0118 VT 24 mcg/mL trough drawn appropriately, patient is supratherapeutic. Will decrease dose to vanc 750 mg IV q12h starting today @ 0230. Will draw trough 12/31 @ 1330 prior to 4th dose. Scr up 0.81 >> 1.02 will continue to monitor.  Height: 5\' 7"  (170.2 cm) Weight: 209 lb 14.1 oz (95.2 kg) IBW/kg (Calculated) : 66.1  Temp (24hrs), Avg:98.5 F (36.9 C), Min:98.2 F (36.8 C), Max:98.9 F (37.2 C)  Recent Labs  Lab 06/13/18 1243 06/14/18 0308 06/15/18 0508 06/15/18 0937 06/16/18 0530 06/17/18 0119  WBC 8.9 7.0 9.5  --   --   --   CREATININE 0.82 0.77 0.73  --  0.81  --   VANCOTROUGH  --   --   --  21*  --  24*    Estimated Creatinine Clearance: 89.3 mL/min (by C-G formula based on SCr of 0.81 mg/dL).    Antimicrobials this admission: vancomycin 12/26 >>  Zosyn 12/26 >> 12/28 Cefepime 12/28>>   Microbiology results: 12/27 WCx pending 12/26 WCx rare GPC 12/26 BCx pending 12/26 MRSA PCR (-)  Thank you for allowing pharmacy to be a part of this patient's care.  Tobie Lords, PharmD, BCPS Clinical Pharmacist 06/17/2018 2:19 AM

## 2018-06-18 LAB — CULTURE, BLOOD (ROUTINE X 2)
Culture: NO GROWTH
Culture: NO GROWTH
Special Requests: ADEQUATE
Special Requests: ADEQUATE

## 2018-06-19 LAB — AEROBIC/ANAEROBIC CULTURE W GRAM STAIN (SURGICAL/DEEP WOUND)

## 2018-06-19 LAB — AEROBIC/ANAEROBIC CULTURE (SURGICAL/DEEP WOUND)

## 2018-07-24 ENCOUNTER — Other Ambulatory Visit: Payer: Self-pay

## 2018-07-24 ENCOUNTER — Observation Stay
Admission: EM | Admit: 2018-07-24 | Discharge: 2018-07-25 | Disposition: A | Discharge status: Home / Self Care | Payer: Medicare Other | Attending: Internal Medicine | Admitting: Internal Medicine

## 2018-07-24 ENCOUNTER — Emergency Department: Payer: Medicare Other

## 2018-07-24 ENCOUNTER — Encounter: Payer: Self-pay | Admitting: Emergency Medicine

## 2018-07-24 DIAGNOSIS — E875 Hyperkalemia: Secondary | ICD-10-CM | POA: Diagnosis not present

## 2018-07-24 DIAGNOSIS — R001 Bradycardia, unspecified: Secondary | ICD-10-CM | POA: Diagnosis not present

## 2018-07-24 DIAGNOSIS — Z79899 Other long term (current) drug therapy: Secondary | ICD-10-CM | POA: Insufficient documentation

## 2018-07-24 DIAGNOSIS — E860 Dehydration: Principal | ICD-10-CM | POA: Insufficient documentation

## 2018-07-24 DIAGNOSIS — I10 Essential (primary) hypertension: Secondary | ICD-10-CM | POA: Diagnosis not present

## 2018-07-24 DIAGNOSIS — E785 Hyperlipidemia, unspecified: Secondary | ICD-10-CM | POA: Diagnosis not present

## 2018-07-24 DIAGNOSIS — E1140 Type 2 diabetes mellitus with diabetic neuropathy, unspecified: Secondary | ICD-10-CM | POA: Diagnosis not present

## 2018-07-24 DIAGNOSIS — G40909 Epilepsy, unspecified, not intractable, without status epilepticus: Secondary | ICD-10-CM | POA: Diagnosis not present

## 2018-07-24 DIAGNOSIS — R55 Syncope and collapse: Secondary | ICD-10-CM | POA: Diagnosis not present

## 2018-07-24 DIAGNOSIS — E7800 Pure hypercholesterolemia, unspecified: Secondary | ICD-10-CM | POA: Diagnosis not present

## 2018-07-24 DIAGNOSIS — R609 Edema, unspecified: Secondary | ICD-10-CM | POA: Insufficient documentation

## 2018-07-24 DIAGNOSIS — F419 Anxiety disorder, unspecified: Secondary | ICD-10-CM | POA: Insufficient documentation

## 2018-07-24 DIAGNOSIS — Z8546 Personal history of malignant neoplasm of prostate: Secondary | ICD-10-CM | POA: Insufficient documentation

## 2018-07-24 DIAGNOSIS — N179 Acute kidney failure, unspecified: Secondary | ICD-10-CM | POA: Insufficient documentation

## 2018-07-24 DIAGNOSIS — R531 Weakness: Secondary | ICD-10-CM | POA: Insufficient documentation

## 2018-07-24 LAB — URINALYSIS, COMPLETE (UACMP) WITH MICROSCOPIC
Bacteria, UA: NONE SEEN
Bilirubin Urine: NEGATIVE
Glucose, UA: 50 mg/dL — AB
Hgb urine dipstick: NEGATIVE
Ketones, ur: NEGATIVE mg/dL
Leukocytes, UA: NEGATIVE
Nitrite: NEGATIVE
Protein, ur: NEGATIVE mg/dL
Specific Gravity, Urine: 1.006 (ref 1.005–1.030)
Squamous Epithelial / HPF: NONE SEEN (ref 0–5)
WBC, UA: NONE SEEN WBC/hpf (ref 0–5)
pH: 5 (ref 5.0–8.0)

## 2018-07-24 LAB — BASIC METABOLIC PANEL
Anion gap: 6 (ref 5–15)
Anion gap: 5 (ref 5–15)
BUN: 22 mg/dL (ref 8–23)
BUN: 21 mg/dL (ref 8–23)
CO2: 23 mmol/L (ref 22–32)
CO2: 23 mmol/L (ref 22–32)
Calcium: 9 mg/dL (ref 8.9–10.3)
Calcium: 8.6 mg/dL — ABNORMAL LOW (ref 8.9–10.3)
Chloride: 104 mmol/L (ref 98–111)
Chloride: 106 mmol/L (ref 98–111)
Creatinine, Ser: 1.33 mg/dL — ABNORMAL HIGH (ref 0.61–1.24)
Creatinine, Ser: 1.3 mg/dL — ABNORMAL HIGH (ref 0.61–1.24)
GFR calc Af Amer: >60 mL/min (ref >60)
GFR calc Af Amer: >60 mL/min (ref >60)
GFR calc non Af Amer: 53 mL/min — ABNORMAL LOW (ref >60)
GFR calc non Af Amer: 54 mL/min — ABNORMAL LOW (ref >60)
Glucose, Bld: 144 mg/dL — ABNORMAL HIGH (ref 70–99)
Glucose, Bld: 211 mg/dL — ABNORMAL HIGH (ref 70–99)
Potassium: 5.2 mmol/L — ABNORMAL HIGH (ref 3.5–5.1)
Potassium: 5.3 mmol/L — ABNORMAL HIGH (ref 3.5–5.1)
Sodium: 133 mmol/L — ABNORMAL LOW (ref 135–145)
Sodium: 134 mmol/L — ABNORMAL LOW (ref 135–145)

## 2018-07-24 LAB — CBC
HCT: 32.1 % — ABNORMAL LOW (ref 39.0–52.0)
HCT: 29.6 % — ABNORMAL LOW (ref 39.0–52.0)
Hemoglobin: 9.5 g/dL — ABNORMAL LOW (ref 13.0–17.0)
Hemoglobin: 8.8 g/dL — ABNORMAL LOW (ref 13.0–17.0)
MCH: 18.8 pg — ABNORMAL LOW (ref 26.0–34.0)
MCH: 18.8 pg — ABNORMAL LOW (ref 26.0–34.0)
MCHC: 29.6 g/dL — AB (ref 30.0–36.0)
MCHC: 29.7 g/dL — ABNORMAL LOW (ref 30.0–36.0)
MCV: 63.6 fL — ABNORMAL LOW (ref 80.0–100.0)
MCV: 63.2 fL — ABNORMAL LOW (ref 80.0–100.0)
Platelets: 191 10*3/uL (ref 150–400)
Platelets: 192 10*3/uL (ref 150–400)
RBC: 5.05 MIL/uL (ref 4.22–5.81)
RBC: 4.68 MIL/uL (ref 4.22–5.81)
RDW: 15.4 % (ref 11.5–15.5)
RDW: 15.2 % (ref 11.5–15.5)
WBC: 7.3 10*3/uL (ref 4.0–10.5)
WBC: 6.9 10*3/uL (ref 4.0–10.5)
nRBC: 0.4 % — ABNORMAL HIGH (ref 0.0–0.2)
nRBC: 0 % (ref 0.0–0.2)

## 2018-07-24 LAB — GLUCOSE, CAPILLARY
Glucose-Capillary: 133 mg/dL — ABNORMAL HIGH (ref 70–99)
Glucose-Capillary: 151 mg/dL — ABNORMAL HIGH (ref 70–99)

## 2018-07-24 LAB — TROPONIN I: Troponin I: <0.03 ng/mL (ref <0.03)

## 2018-07-24 MED ORDER — ALPRAZOLAM 0.5 MG PO TABS
0.5000 mg | ORAL_TABLET | Freq: Two times a day (BID) | ORAL | Status: DC
  Administered 2018-07-24 – 2018-07-25 (×2): 0.5 mg via ORAL
  Filled 2018-07-24 (×2): qty 1

## 2018-07-24 MED ORDER — ENOXAPARIN SODIUM 40 MG/0.4ML ~~LOC~~ SOLN
30.0000 mg | SUBCUTANEOUS | Status: DC
  Administered 2018-07-24: 30 mg via SUBCUTANEOUS
  Filled 2018-07-24: qty 0.4

## 2018-07-24 MED ORDER — INSULIN ASPART 100 UNIT/ML ~~LOC~~ SOLN: 0.0000 [IU] | Freq: Three times a day (TID) | SUBCUTANEOUS | Status: DC

## 2018-07-24 MED ORDER — INSULIN ASPART 100 UNIT/ML ~~LOC~~ SOLN: 0.0000 [IU] | Freq: Every day | SUBCUTANEOUS | Status: DC

## 2018-07-24 MED ORDER — ACETAMINOPHEN 650 MG RE SUPP: 650.0000 mg | Freq: Four times a day (QID) | RECTAL | Status: DC | PRN

## 2018-07-24 MED ORDER — SODIUM CHLORIDE 0.9 % IV SOLN
Freq: Once | INTRAVENOUS | Status: AC
  Administered 2018-07-24: 19:00:00 via INTRAVENOUS

## 2018-07-24 MED ORDER — TRAMADOL HCL 50 MG PO TABS: 50.0000 mg | ORAL_TABLET | Freq: Four times a day (QID) | ORAL | Status: DC | PRN

## 2018-07-24 MED ORDER — ONDANSETRON HCL 4 MG/2ML IJ SOLN: 4.0000 mg | Freq: Four times a day (QID) | INTRAMUSCULAR | Status: DC | PRN

## 2018-07-24 MED ORDER — ACETAMINOPHEN 325 MG PO TABS: 650.0000 mg | ORAL_TABLET | Freq: Four times a day (QID) | ORAL | Status: DC | PRN

## 2018-07-24 MED ORDER — PATIROMER SORBITEX CALCIUM 8.4 G PO PACK
8.4000 g | PACK | Freq: Every day | ORAL | Status: DC
  Administered 2018-07-24: 8.4 g via ORAL
  Filled 2018-07-24 (×2): qty 1

## 2018-07-24 MED ORDER — ENOXAPARIN SODIUM 40 MG/0.4ML ~~LOC~~ SOLN: 40.0000 mg | SUBCUTANEOUS | Status: DC

## 2018-07-24 MED ORDER — ONDANSETRON HCL 4 MG PO TABS: 4.0000 mg | ORAL_TABLET | Freq: Four times a day (QID) | ORAL | Status: DC | PRN

## 2018-07-24 MED ORDER — SODIUM CHLORIDE 0.9 % IV BOLUS
1000.0000 mL | Freq: Once | INTRAVENOUS | Status: AC
  Administered 2018-07-24: 1000 mL via INTRAVENOUS

## 2018-07-24 MED ORDER — SODIUM CHLORIDE 0.9% FLUSH
3.0000 mL | Freq: Two times a day (BID) | INTRAVENOUS | Status: DC
  Administered 2018-07-24 – 2018-07-25 (×2): 3 mL via INTRAVENOUS

## 2018-07-24 MED ORDER — DOCUSATE SODIUM 100 MG PO CAPS
100.0000 mg | ORAL_CAPSULE | Freq: Two times a day (BID) | ORAL | Status: DC
  Administered 2018-07-24 – 2018-07-25 (×2): 100 mg via ORAL
  Filled 2018-07-24 (×2): qty 1

## 2018-07-24 MED ORDER — SODIUM CHLORIDE 0.9 % IV SOLN
INTRAVENOUS | Status: DC
  Administered 2018-07-24 – 2018-07-25 (×2): via INTRAVENOUS

## 2018-07-24 NOTE — ED Notes (Signed)
Pt laid back and given warm blanket. No other needs at this time.

## 2018-07-24 NOTE — Progress Notes (Signed)
Admission profile completed by SWOT RN.

## 2018-07-24 NOTE — ED Notes (Signed)
Repeat BMP drawn and sent

## 2018-07-24 NOTE — ED Notes (Signed)
Attempted to start an IV x2 at this time. Pt giong to CT

## 2018-07-24 NOTE — ED Triage Notes (Signed)
Pt arrives with sister with concerns over dizziness and loss of balance that started yesterday between 2000 and 2300 per pt report. In triage pt a & o x 4, speech clear, left grip minimally weaker that right but per pt "my left side is always weaker." Pt also reports a headache that started this morning; pt reports the pain is minimal.

## 2018-07-24 NOTE — ED Notes (Signed)
CBG 133. Kirke Shaggy, RN aware.

## 2018-07-24 NOTE — H&P (Signed)
[facility] NAME: Mike [name]    MR#:  [id number]  DATE OF BIRTH:  12-09-1945  DATE OF ADMISSION:  07/24/2018  PRIMARY CARE PHYSICIAN: Adin Hector, MD   REQUESTING/REFERRING PHYSICIAN: Rudene Re, MD  CHIEF COMPLAINT:   Dizziness HISTORY OF PRESENT ILLNESS:  Mike [name]  is a 73 y.o. male with a known history of diabetes mellitus, hypertension, hyperlipidemia remote history of prostate cancer is presenting to the ED with a chief complaint of generalized weakness associated with the lightheadedness.  Orthostatics were negative patient had few episodes of her diarrhea over the weekend but it was completely resolved now.  According to the patient sister who is extremely weak and unable to get out of the bed as he was feeling extremely weak he denies any fever any nausea or vomiting.  No abdominal pain.  2 sisters at bedside.  Potassium at 5.3 creatinine 1.30 which was normal at his baseline  PAST MEDICAL HISTORY:   Past Medical History:  Diagnosis Date  . Anxiety   . Diabetes mellitus without complication ([place])   . Epilepsy ([place])   . High cholesterol   . Hypertension   . Prostate cancer [facility]) 2004   prostate removed    PAST SURGICAL HISTOIRY:   Past Surgical History:  Procedure Laterality Date  . IRRIGATION AND DEBRIDEMENT FOOT Right 06/14/2018   Procedure: IRRIGATION AND DEBRIDEMENT FOOT;  Surgeon: Sharlotte Alamo, DPM;  Location: [facility] ORS;  Service: Podiatry;  Laterality: Right;    SOCIAL HISTORY:   Social History   Tobacco Use  . Smoking status: Never Smoker  . Smokeless tobacco: Never Used  Substance Use Topics  . Alcohol use: No    FAMILY HISTORY:  No family history on file.  DRUG ALLERGIES:   Allergies  Allergen Reactions  . Aleve [Naproxen] Nausea And Vomiting  . Motrin [Ibuprofen] Nausea Only    REVIEW OF SYSTEMS:  CONSTITUTIONAL: No fever, fatigue.  Endorsing generalized weakness.   EYES: No blurred or double vision.  EARS, NOSE, AND THROAT: No tinnitus or ear pain.  RESPIRATORY: No cough, shortness of breath, wheezing or hemoptysis.  CARDIOVASCULAR: No chest pain, orthopnea, edema.  GASTROINTESTINAL: No nausea, vomiting, diarrhea or abdominal pain.  GENITOURINARY: No dysuria, hematuria.  ENDOCRINE: No polyuria, nocturia,  HEMATOLOGY: No anemia, easy bruising or bleeding SKIN: No rash or lesion. MUSCULOSKELETAL: No joint pain or arthritis.   NEUROLOGIC: No tingling, numbness, weakness.  PSYCHIATRY: No anxiety or depression.   MEDICATIONS AT HOME:   Prior to Admission medications   Medication Sig Start Date End Date Taking? Authorizing Provider  acidophilus (RISAQUAD) CAPS capsule Take 1 capsule by mouth daily. 06/17/18  Yes Gladstone Lighter, MD  ALPRAZolam Duanne Moron) 0.5 MG tablet Take 1 tablet (0.5 mg total) by mouth daily. 06/18/18  Yes Gladstone Lighter, MD  amLODipine (NORVASC) 10 MG tablet Take 1 tablet (10 mg total) by mouth daily. 06/18/18  Yes Gladstone Lighter, MD  atorvastatin (LIPITOR) 20 MG tablet Take 20 mg by mouth daily.   Yes [provider]  Cholecalciferol (VITAMIN D-1000 MAX ST) 25 MCG (1000 UT) tablet Take 1,000 Units by mouth daily. 08/30/12  Yes [provider]  Cyanocobalamin (VITAMIN B-12 CR) 1000 MCG TBCR Take 1,000 mcg by mouth daily. 12/27/10  Yes [provider]  gabapentin (NEURONTIN) 300 MG capsule Take 300 mg by mouth daily.   Yes [provider]  gabapentin (NEURONTIN) 300 MG capsule Take 1,200  mg by mouth at bedtime.   Yes [provider]  glimepiride (AMARYL) 2 MG tablet Take 2-6 mg by mouth 2 (two) times daily.  07/19/18  Yes [provider]  hydrOXYzine (ATARAX/VISTARIL) 25 MG tablet Take 25-50 mg by mouth 2 (two) times daily. 01/05/15  Yes [provider]  irbesartan (AVAPRO) 150 MG tablet Take 150 mg by mouth daily.   Yes [provider]  metoprolol tartrate  (LOPRESSOR) 50 MG tablet Take 50 mg by mouth 2 (two) times daily. 03/28/17  Yes [provider]  nitroGLYCERIN (NITROSTAT) 0.4 MG SL tablet Take 0.4 mg by mouth as needed. 07/24/14  Yes [provider]  Omega-3 1000 MG CAPS Take 1,000 mg by mouth daily. 12/23/10  Yes [provider]  omeprazole (PRILOSEC) 20 MG capsule Take 20 mg by mouth 2 (two) times daily. 12/19/14  Yes [provider]  PARoxetine (PAXIL) 20 MG tablet Take 1 tablet (20 mg total) by mouth daily. 06/18/18  Yes Gladstone Lighter, MD  PHENobarbital (LUMINAL) 64.8 MG tablet Take 1 tablet (64.8 mg total) by mouth 3 (three) times daily. 06/17/18  Yes Gladstone Lighter, MD  sulfamethoxazole-trimethoprim (BACTRIM DS,SEPTRA DS) 800-160 MG tablet Take 1 tablet by mouth 2 (two) times daily. 07/15/18  Yes [provider]  ALPRAZolam Duanne Moron) 1 MG tablet Take 1 tablet (1 mg total) by mouth at bedtime as needed for anxiety. Patient not taking: Reported on 07/24/2018 06/17/18   Gladstone Lighter, MD  HYDROcodone-acetaminophen (NORCO/VICODIN) 5-325 MG tablet Take 1-2 tablets by mouth every 6 (six) hours as needed for moderate pain or severe pain. Patient not taking: Reported on 07/24/2018 06/17/18   Gladstone Lighter, MD      VITAL SIGNS:  Blood pressure (!) 156/49, pulse 95, temperature 97.6 F (36.4 C), temperature source Oral, resp. rate 17, height 5\' 7"  (1.702 m), weight 94.3 kg, SpO2 (!) 80 %.  PHYSICAL EXAMINATION:  GENERAL:  73 y.o.-year-old patient lying in the bed with no acute distress.  EYES: Pupils equal, round, reactive to light and accommodation. No scleral icterus. Extraocular muscles intact.  HEENT: Head atraumatic, normocephalic. Oropharynx and nasopharynx clear.  NECK:  Supple, no jugular venous distention. No thyroid enlargement, no tenderness.  LUNGS: Normal breath sounds bilaterally, no wheezing, rales,rhonchi or crepitation. No use of accessory muscles of respiration.   CARDIOVASCULAR: S1, S2 normal. No murmurs, rubs, or gallops.  ABDOMEN: Soft, nontender, nondistended. Bowel sounds present.  EXTREMITIES: No pedal edema, cyanosis, or clubbing.  NEUROLOGIC: Awake, alert and oriented x3. Sensation intact. Gait not checked.  PSYCHIATRIC: The patient is alert and oriented x 3.  SKIN: No obvious rash, lesion, or ulcer.   LABORATORY PANEL:   CBC Recent Labs  Lab 07/24/18 1205  WBC 7.3  HGB 9.5*  HCT 32.1*  PLT 191   ------------------------------------------------------------------------------------------------------------------  Chemistries  Recent Labs  Lab 07/24/18 1629  NA 134*  K 5.3*  CL 106  CO2 23  GLUCOSE 211*  BUN 21  CREATININE 1.30*  CALCIUM 8.6*   ------------------------------------------------------------------------------------------------------------------  Cardiac Enzymes Recent Labs  Lab 07/24/18 1205  TROPONINI <0.03   ------------------------------------------------------------------------------------------------------------------  RADIOLOGY:  Ct Head Wo Contrast  Result Date: 07/24/2018 CLINICAL DATA:  Bilateral lower extremity weakness and imbalance. Chronic occipital headache. EXAM: CT HEAD WITHOUT CONTRAST TECHNIQUE: Contiguous axial images were obtained from the base of the skull through the vertex without intravenous contrast. COMPARISON:  10/03/2016. FINDINGS: Brain: Stable mildly enlarged ventricles and cortical sulci. Minimal patchy white matter low density in both  cerebral hemispheres. No intracranial hemorrhage, mass lesion or CT evidence of acute infarction. Vascular: No hyperdense vessel or unexpected calcification. Skull: Mild bilateral hyperostosis frontalis. Sinuses/Orbits: Unremarkable. Other: None. IMPRESSION: 1. No acute abnormality. 2. Stable mild atrophy and minimal chronic small vessel white matter ischemic changes in both cerebral hemispheres. Electronically Signed   By: Claudie Revering M.D.   On:  07/24/2018 15:06    EKG:   Orders placed or performed during the hospital encounter of 07/24/18  . EKG 12-Lead  . EKG 12-Lead  . ED EKG  . ED EKG    IMPRESSION AND PLAN:    #Acute kidney injury Admit to MedSurg unit Hydrate with IV fluids Avoid nephrotoxins Check a.m. labs Hold Avapro  #Hyperkalemia Hold Avapro and give 1 dose of Veltassa Providing hydration check a.m. labs  #Near-syncope Not orthostatic. Hydrate with IV fluids PT evaluation  #Essential hypertension Continue home medications metoprolol, amlodipine and hold Avapro for now  #Diabetes mellitus continue Amaryl and provide sliding scale insulin  #Anxiety -lorazepam as needed  #Diabetic neuropathy continue Neurontin  #DVT prophylaxis with Lovenox subcu dose adjusted    All the records are reviewed and case discussed with ED provider. Management plans discussed with the patient, family and they are in agreement.  CODE STATUS: FC  TOTAL TIME TAKING CARE OF THIS PATIENT: 66minutes.   Note: This dictation was prepared with Dragon dictation along with smaller phrase technology. Any transcriptional errors that result from this process are unintentional.  Nicholes Mango M.D on 07/24/2018 at 6:04 PM  Between 7am to 6pm - Pager - [phone]  After 6pm go to [email or web address] - password [email or web address] [facility]  [facility]  Office  [phone]  CC: Primary care physician; Adin Hector, MD

## 2018-07-24 NOTE — ED Notes (Signed)
ED TO INPATIENT HANDOFF REPORT  Name/Age/Gender Mike [name] 73 y.o. male  Code Status    Code Status Orders  (From admission, onward)         Start     Ordered   07/24/18 1740  Full code  Continuous     07/24/18 1742        Code Status History    Date Active Date Inactive Code Status Order ID Comments User Context   06/13/2018 1225 06/17/2018 1654 Full Code [id number]  Epifanio Lesches, MD Inpatient        Chief Complaint dizziness  Level of Care/Admitting Diagnosis ED Disposition    ED Disposition Condition [place]: [facility] [[id number]]  Level of Care: Med-Surg [16]  Diagnosis: AKI (acute kidney injury) [facility]) [[id number]]  Admitting Physician: Nicholes Mango [[id number]]  Attending Physician: Nicholes Mango [[id number]]  Estimated length of stay: 3 - 4 days  Certification:: I certify this patient will need inpatient services for at least 2 midnights  Bed request comments: 1c  PT Class (Do Not Modify): Inpatient [101]  PT Acc Code (Do Not Modify): Private [1]       Medical History Past Medical History:  Diagnosis Date  . Anxiety   . Diabetes mellitus without complication ([place])   . Epilepsy ([place])   . High cholesterol   . Hypertension   . Prostate cancer [facility]) 2004   prostate removed    Allergies Allergies  Allergen Reactions  . Aleve [Naproxen] Nausea And Vomiting  . Motrin [Ibuprofen] Nausea Only    IV Location/Drains/Wounds Patient Lines/Drains/Airways Status   Active Line/Drains/Airways    Name:   Placement date:   Placement time:   Site:   Days:   Peripheral IV 07/24/18 Right Antecubital   07/24/18    1446    Antecubital   less than 1   Incision (Closed) 06/14/18 Foot Right   06/14/18    0828     40   Wound / Incision (Open or Dehisced) 06/13/18 Diabetic ulcer Foot Right   06/13/18    1200    Foot   41          Labs/Imaging Results for orders placed or performed during the hospital encounter of 07/24/18  (from the past 48 hour(s))  Basic metabolic panel     Status: Abnormal   Collection Time: 07/24/18 12:05 PM  Result Value Ref Range   Sodium 133 (L) 135 - 145 mmol/L   Potassium 5.2 (H) 3.5 - 5.1 mmol/L   Chloride 104 98 - 111 mmol/L   CO2 23 22 - 32 mmol/L   Glucose, Bld 144 (H) 70 - 99 mg/dL   BUN 22 8 - 23 mg/dL   Creatinine, Ser 1.33 (H) 0.61 - 1.24 mg/dL   Calcium 9.0 8.9 - 10.3 mg/dL   GFR calc non Af Amer 53 (L) >60 mL/min   GFR calc Af Amer >60 >60 mL/min   Anion gap 6 5 - 15    Comment: Performed at [facility], [address] [place], [place] [address]  CBC     Status: Abnormal   Collection Time: 07/24/18 12:05 PM  Result Value Ref Range   WBC 7.3 4.0 - 10.5 K/uL   RBC 5.05 4.22 - 5.81 MIL/uL   Hemoglobin 9.5 (L) 13.0 - 17.0 g/dL   HCT 32.1 (L) 39.0 - 52.0 %   MCV 63.6 (L) 80.0 - 100.0 fL   MCH  18.8 (L) 26.0 - 34.0 pg   MCHC 29.6 (L) 30.0 - 36.0 g/dL   RDW 15.4 11.5 - 15.5 %   Platelets 191 150 - 400 K/uL   nRBC 0.4 (H) 0.0 - 0.2 %    Comment: Performed at [facility], [address] [place], [place] [address]  Troponin I - Add-On to previous collection     Status: None   Collection Time: 07/24/18 12:05 PM  Result Value Ref Range   Troponin I <0.03 <0.03 ng/mL    Comment: Performed at [facility], [address] [place], [place] [address]  Glucose, capillary     Status: Abnormal   Collection Time: 07/24/18 12:12 PM  Result Value Ref Range   Glucose-Capillary 133 (H) 70 - 99 mg/dL   Comment 1 Notify RN    Comment 2 Document in Chart   Urinalysis, Complete w Microscopic     Status: Abnormal   Collection Time: 07/24/18  2:11 PM  Result Value Ref Range   Color, Urine STRAW (A) YELLOW   APPearance CLEAR (A) CLEAR   Specific Gravity, Urine 1.006 1.005 - 1.030   pH 5.0 5.0 - 8.0   Glucose, UA 50 (A) NEGATIVE mg/dL   Hgb urine dipstick NEGATIVE NEGATIVE   Bilirubin Urine NEGATIVE NEGATIVE   Ketones, ur NEGATIVE NEGATIVE mg/dL    Protein, ur NEGATIVE NEGATIVE mg/dL   Nitrite NEGATIVE NEGATIVE   Leukocytes, UA NEGATIVE NEGATIVE   WBC, UA NONE SEEN 0 - 5 WBC/hpf   Bacteria, UA NONE SEEN NONE SEEN   Squamous Epithelial / LPF NONE SEEN 0 - 5    Comment: Performed at [facility], [address] [place], [place] [address]  Basic metabolic panel     Status: Abnormal   Collection Time: 07/24/18  4:29 PM  Result Value Ref Range   Sodium 134 (L) 135 - 145 mmol/L   Potassium 5.3 (H) 3.5 - 5.1 mmol/L   Chloride 106 98 - 111 mmol/L   CO2 23 22 - 32 mmol/L   Glucose, Bld 211 (H) 70 - 99 mg/dL   BUN 21 8 - 23 mg/dL   Creatinine, Ser 1.30 (H) 0.61 - 1.24 mg/dL   Calcium 8.6 (L) 8.9 - 10.3 mg/dL   GFR calc non Af Amer 54 (L) >60 mL/min   GFR calc Af Amer >60 >60 mL/min   Anion gap 5 5 - 15    Comment: Performed at [facility], [address] [place], [place] [address]  CBC     Status: Abnormal   Collection Time: 07/24/18  6:38 PM  Result Value Ref Range   WBC 6.9 4.0 - 10.5 K/uL   RBC 4.68 4.22 - 5.81 MIL/uL   Hemoglobin 8.8 (L) 13.0 - 17.0 g/dL    Comment: Reticulocyte Hemoglobin testing may be clinically indicated, consider ordering this additional test [id number]    HCT 29.6 (L) 39.0 - 52.0 %   MCV 63.2 (L) 80.0 - 100.0 fL   MCH 18.8 (L) 26.0 - 34.0 pg   MCHC 29.7 (L) 30.0 - 36.0 g/dL   RDW 15.2 11.5 - 15.5 %   Platelets 192 150 - 400 K/uL   nRBC 0.0 0.0 - 0.2 %    Comment: Performed at [facility], [address] [place], [place] [address]   Ct Head Wo Contrast  Result Date: 07/24/2018 CLINICAL DATA:  Bilateral lower extremity weakness and imbalance. Chronic occipital headache. EXAM: CT HEAD WITHOUT CONTRAST  TECHNIQUE: Contiguous axial images were obtained from the base of the skull through the vertex without intravenous contrast. COMPARISON:  10/03/2016. FINDINGS: Brain: Stable mildly enlarged ventricles and cortical sulci. Minimal patchy white matter low density in both  cerebral hemispheres. No intracranial hemorrhage, mass lesion or CT evidence of acute infarction. Vascular: No hyperdense vessel or unexpected calcification. Skull: Mild bilateral hyperostosis frontalis. Sinuses/Orbits: Unremarkable. Other: None. IMPRESSION: 1. No acute abnormality. 2. Stable mild atrophy and minimal chronic small vessel white matter ischemic changes in both cerebral hemispheres. Electronically Signed   By: Claudie Revering M.D.   On: 07/24/2018 15:06    Pending Labs Unresulted Labs (From admission, onward)    Start     Ordered   07/31/18 0500  Creatinine, serum  (enoxaparin (LOVENOX)    CrCl < 30 ml/min)  Weekly,   STAT    Comments:  while on enoxaparin therapy.    07/24/18 1742   07/25/18 0500  CBC  Tomorrow morning,   STAT     07/24/18 1742   07/25/18 0500  Comprehensive metabolic panel  Tomorrow morning,   STAT     07/24/18 1742   07/25/18 0500  Hemoglobin A1c  Tomorrow morning,   STAT     07/24/18 1742          Vitals/Pain Today's Vitals   07/24/18 2215 07/24/18 2230 07/24/18 2245 07/24/18 2300  BP:  (!) 142/94  (!) 142/57  Pulse: (!) 44 (!) 50 70 (!) 49  Resp: 14 18 12 15   Temp:      TempSrc:      SpO2: 100% 100% 100% 100%  Weight:      Height:      PainSc:        Isolation Precautions No active isolations  Medications Medications  sodium chloride flush (NS) 0.9 % injection 3 mL (has no administration in time range)  0.9 %  sodium chloride infusion ( Intravenous New Bag/Given 07/24/18 2051)  acetaminophen (TYLENOL) tablet 650 mg (has no administration in time range)    Or  acetaminophen (TYLENOL) suppository 650 mg (has no administration in time range)  traMADol (ULTRAM) tablet 50 mg (has no administration in time range)  docusate sodium (COLACE) capsule 100 mg (has no administration in time range)  ondansetron (ZOFRAN) tablet 4 mg (has no administration in time range)    Or  ondansetron (ZOFRAN) injection 4 mg (has no administration in time range)   insulin aspart (novoLOG) injection 0-9 Units (has no administration in time range)  insulin aspart (novoLOG) injection 0-5 Units (has no administration in time range)  ALPRAZolam (XANAX) tablet 0.5 mg (0.5 mg Oral Given 07/24/18 1827)  patiromer Daryll Drown) packet 8.4 g (8.4 g Oral Given 07/24/18 1833)  enoxaparin (LOVENOX) injection 40 mg (has no administration in time range)  sodium chloride 0.9 % bolus 1,000 mL (1,000 mLs Intravenous Bolus 07/24/18 1447)  0.9 %  sodium chloride infusion ( Intravenous Stopped 07/24/18 2050)    Mobility x2 assist

## 2018-07-24 NOTE — Progress Notes (Signed)
Family Meeting Note  Advance Directive:yes  Today a meeting took place with the Patient, 2 sisters at bedside   The following clinical team members were present during this meeting:MD  The following were discussed:Patient's diagnosis: Generalized weakness, acute kidney injury, hyperkalemia, dehydration, diabetes mellitus hypertension, hyperlipidemia, treatment plan of care discussed in detail with the patient.  He verbalized understanding of the plan.    Patient's progosis: Unable to determine and Goals for treatment: Full Code  Sister Enis Slipper is the healthcare POA  Additional follow-up to be provided: Hospitalist and physical therapist  Time spent during discussion:17 min  Nicholes Mango, MD

## 2018-07-24 NOTE — ED Provider Notes (Signed)
[facility] Emergency Department Provider Note  ____________________________________________  Time seen: Approximately 2:14 PM  I have reviewed the triage vital signs and the nursing notes.   HISTORY  Chief Complaint Dizziness   HPI Mike [name] is a 73 y.o. male history of diabetes, hypertension, hyperlipidemia, remote prostate cancer who presents for evaluation of generalized weakness.  Patient reports that he was doing well until yesterday.  Last night he started having some weakness and this morning he was unable to walk and felt severely weak.  He denies unilateral weakness, slurred speech, facial droop, changes in vision, fall.  He reports that yesterday evening and this morning he had a mild occipital headache however he has a history of similar headaches for several years.  No headache at this time.  No prior stroke.  No family history of stroke.  No chest pain or shortness of breath, no cough, no fever or chills, no vomiting or diarrhea, no dysuria or hematuria, no abdominal pain, no dizziness.  2 days ago patient reports feeling nauseous but since then has had normal appetite.  Past Medical History:  Diagnosis Date  . Anxiety   . Diabetes mellitus without complication ([place])   . Epilepsy ([place])   . High cholesterol   . Hypertension   . Prostate cancer [facility]) 2004   prostate removed    Patient Active Problem List   Diagnosis Date Noted  . 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 ([place]) 06/13/2018    Past Surgical History:  Procedure Laterality Date  . IRRIGATION AND DEBRIDEMENT FOOT Right 06/14/2018   Procedure: IRRIGATION AND DEBRIDEMENT FOOT;  Surgeon: Sharlotte Alamo, DPM;  Location: [facility] ORS;  Service: Podiatry;  Laterality: Right;    Prior to Admission medications   Medication Sig Start Date End Date Taking? Authorizing Provider  acidophilus (RISAQUAD) CAPS capsule Take 1 capsule by mouth daily. 06/17/18  Yes Gladstone Lighter, MD  ALPRAZolam Duanne Moron)  0.5 MG tablet Take 1 tablet (0.5 mg total) by mouth daily. 06/18/18  Yes Gladstone Lighter, MD  amLODipine (NORVASC) 10 MG tablet Take 1 tablet (10 mg total) by mouth daily. 06/18/18  Yes Gladstone Lighter, MD  atorvastatin (LIPITOR) 20 MG tablet Take 20 mg by mouth daily.   Yes [provider]  Cholecalciferol (VITAMIN D-1000 MAX ST) 25 MCG (1000 UT) tablet Take 1,000 Units by mouth daily. 08/30/12  Yes [provider]  Cyanocobalamin (VITAMIN B-12 CR) 1000 MCG TBCR Take 1,000 mcg by mouth daily. 12/27/10  Yes [provider]  gabapentin (NEURONTIN) 300 MG capsule Take 300 mg by mouth daily.   Yes [provider]  gabapentin (NEURONTIN) 300 MG capsule Take 1,200 mg by mouth at bedtime.   Yes [provider]  glimepiride (AMARYL) 2 MG tablet Take 2-6 mg by mouth 2 (two) times daily.  07/19/18  Yes [provider]  hydrOXYzine (ATARAX/VISTARIL) 25 MG tablet Take 25-50 mg by mouth 2 (two) times daily. 01/05/15  Yes [provider]  irbesartan (AVAPRO) 150 MG tablet Take 150 mg by mouth daily.   Yes [provider]  metoprolol tartrate (LOPRESSOR) 50 MG tablet Take 50 mg by mouth 2 (two) times daily. 03/28/17  Yes [provider]  nitroGLYCERIN (NITROSTAT) 0.4 MG SL tablet Take 0.4 mg by mouth as needed. 07/24/14  Yes [provider]  Omega-3 1000 MG CAPS Take 1,000 mg by mouth daily. 12/23/10  Yes [provider]  omeprazole (PRILOSEC) 20 MG capsule Take 20 mg by mouth 2 (  two) times daily. 12/19/14  Yes [provider]  PARoxetine (PAXIL) 20 MG tablet Take 1 tablet (20 mg total) by mouth daily. 06/18/18  Yes Gladstone Lighter, MD  PHENobarbital (LUMINAL) 64.8 MG tablet Take 1 tablet (64.8 mg total) by mouth 3 (three) times daily. 06/17/18  Yes Gladstone Lighter, MD  sulfamethoxazole-trimethoprim (BACTRIM DS,SEPTRA DS) 800-160 MG tablet Take 1 tablet by mouth 2 (two) times daily. 07/15/18  Yes [provider]  ALPRAZolam Duanne Moron) 1 MG tablet Take 1 tablet (1 mg total) by mouth at bedtime as needed for anxiety. Patient not taking: Reported on 07/24/2018 06/17/18   Gladstone Lighter, MD  HYDROcodone-acetaminophen (NORCO/VICODIN) 5-325 MG tablet Take 1-2 tablets by mouth every 6 (six) hours as needed for moderate pain or severe pain. Patient not taking: Reported on 07/24/2018 06/17/18   Gladstone Lighter, MD    Allergies Aleve [naproxen] and Motrin [ibuprofen]  No family history on file.  Social History Social History   Tobacco Use  . Smoking status: Never Smoker  . Smokeless tobacco: Never Used  Substance Use Topics  . Alcohol use: No  . Drug use: No    Review of Systems  Constitutional: Negative for fever. + generalized weakness Eyes: Negative for visual changes. ENT: Negative for sore throat. Neck: No neck pain  Cardiovascular: Negative for chest pain. Respiratory: Negative for shortness of breath. Gastrointestinal: Negative for abdominal pain, vomiting or diarrhea. Genitourinary: Negative for dysuria. Musculoskeletal: Negative for back pain. Skin: Negative for rash. Neurological: Negative for weakness or numbness. + HA Psych: No SI or HI  ____________________________________________   PHYSICAL EXAM:  VITAL SIGNS: ED Triage Vitals  Enc Vitals Group     BP 07/24/18 1203 (!) 143/46     Pulse Rate 07/24/18 1203 63     Resp 07/24/18 1203 16     Temp 07/24/18 1203 97.6 F (36.4 C)     Temp Source 07/24/18 1203 Oral     SpO2 07/24/18 1203 100 %     Weight 07/24/18 1204 208 lb (94.3 kg)     Height 07/24/18 1204 5\' 7"  (1.702 m)     Head Circumference --      Peak Flow --      Pain Score 07/24/18 1204 6     Pain Loc --      Pain Edu? --      Excl. in [place]? --     Constitutional: Alert and oriented. Well appearing and in no apparent distress. HEENT:      Head: Normocephalic and atraumatic.         Eyes: Conjunctivae are normal. Sclera is non-icteric.        Mouth/Throat: Mucous membranes are moist.       Neck: Supple with no signs of meningismus. Cardiovascular: Regular rate and rhythm. No murmurs, gallops, or rubs. 2+ symmetrical distal pulses are present in all extremities. No JVD. Respiratory: Normal respiratory effort. Lungs are clear to auscultation bilaterally. No wheezes, crackles, or rhonchi.  Gastrointestinal: Soft, non tender, and non distended with positive bowel sounds. No rebound or guarding. Musculoskeletal: Nontender with normal range of motion in all extremities. No edema, cyanosis, or erythema of extremities. Neurologic: Normal speech and language. Face is symmetric. Strength and sensation equal x 4. No pronator drift, no dysmeria Skin: Skin is warm, dry and intact. No rash noted. Psychiatric: Mood and affect are normal. Speech and behavior are normal.  ____________________________________________   LABS (all labs ordered are listed, but only abnormal results are  displayed)  Labs Reviewed  BASIC METABOLIC PANEL - Abnormal; Notable for the following components:      Result Value   Sodium 133 (*)    Potassium 5.2 (*)    Glucose, Bld 144 (*)    Creatinine, Ser 1.33 (*)    GFR calc non Af Amer 53 (*)    All other components within normal limits  CBC - Abnormal; Notable for the following components:   Hemoglobin 9.5 (*)    HCT 32.1 (*)    MCV 63.6 (*)    MCH 18.8 (*)    MCHC 29.6 (*)    nRBC 0.4 (*)    All other components within normal limits  URINALYSIS, COMPLETE (UACMP) WITH MICROSCOPIC - Abnormal; Notable for the following components:   Color, Urine STRAW (*)    APPearance CLEAR (*)    Glucose, UA 50 (*)    All other components within normal limits  GLUCOSE, CAPILLARY - Abnormal; Notable for the following components:   Glucose-Capillary 133 (*)    All other components within normal limits  TROPONIN I  CBG MONITORING, ED   ____________________________________________  EKG  ED ECG REPORT I, Rudene Re, the attending physician, personally viewed and interpreted this ECG.  Sinus bradycardia, rate of 47, normal intervals, normal axis, no ST elevations or depressions.  Significant changes when compared to prior. ____________________________________________  RADIOLOGY  I have personally reviewed the images performed during this visit and I agree with the Radiologist's read.   Interpretation by Radiologist:  Ct Head Wo Contrast  Result Date: 07/24/2018 CLINICAL DATA:  Bilateral lower extremity weakness and imbalance. Chronic occipital headache. EXAM: CT HEAD WITHOUT CONTRAST TECHNIQUE: Contiguous axial images were obtained from the base of the skull through the vertex without intravenous contrast. COMPARISON:  10/03/2016. FINDINGS: Brain: Stable mildly enlarged ventricles and cortical sulci. Minimal patchy white matter low density in both cerebral hemispheres. No intracranial hemorrhage, mass lesion or CT evidence of acute infarction. Vascular: No hyperdense vessel or unexpected calcification. Skull: Mild bilateral hyperostosis frontalis. Sinuses/Orbits: Unremarkable. Other: None. IMPRESSION: 1. No acute abnormality. 2. Stable mild atrophy and minimal chronic small vessel white matter ischemic changes in both cerebral hemispheres. Electronically Signed   By: Claudie Revering M.D.   On: 07/24/2018 15:06      ____________________________________________   PROCEDURES  Procedure(s) performed: None Procedures Critical Care performed:  None ____________________________________________   INITIAL IMPRESSION / ASSESSMENT AND PLAN / ED COURSE  73 y.o. male history of diabetes, hypertension, hyperlipidemia, remote prostate cancer who presents for evaluation of generalized weakness since last night.  Patient is in no distress, hemodynamically stable, negative orthostatic vital signs.  EKG with no evidence of ischemia or dysrhythmias.  Labs showing acute kidney injury with creatinine of 1.33  (baseline 0.7-0.8).  Stable anemia.  No leukocytosis. No signs of stroke however with HA will do CT head. Will give IVF. Will check UA to rule out UTI.    _________________________ 3:23 PM on 07/24/2018 -----------------------------------------  Head CT with no acute findings.  Plan to repeat BMP to ensure improvement of creatinine and hyperkalemia and ambulate patient.  If BMP is improving and patient is able to ambulate, plan to discharge home with increase oral hydration.  Discussed plan with patient's nurse and Dr. Archie Balboa who assumed care at 3:30 PM.   As part of my medical decision making, I reviewed the following data within the [place] notes reviewed and incorporated, Labs reviewed , EKG interpreted ,  Old EKG reviewed, Old chart reviewed, Radiograph reviewed , Notes from prior ED visits and [place] Controlled Substance Database    Pertinent labs & imaging results that were available during my care of the patient were reviewed by me and considered in my medical decision making (see chart for details).    ____________________________________________   FINAL CLINICAL IMPRESSION(S) / ED DIAGNOSES  Final diagnoses:  Dehydration  Generalized weakness      NEW MEDICATIONS STARTED DURING THIS VISIT:  ED Discharge Orders    None       Note:  This document was prepared using Dragon voice recognition software and may include unintentional dictation errors.    Alfred Levins, [place], MD 07/24/18 [phone]

## 2018-07-24 NOTE — ED Notes (Signed)
Pt attempted to be given dinner tray. Pt refused and stated that he "did not eat chicken or hospital food."

## 2018-07-25 DIAGNOSIS — E860 Dehydration: Secondary | ICD-10-CM | POA: Diagnosis not present

## 2018-07-25 LAB — COMPREHENSIVE METABOLIC PANEL
ALT: 20 U/L (ref 0–44)
AST: 20 U/L (ref 15–41)
Albumin: 3.5 g/dL (ref 3.5–5.0)
Alkaline Phosphatase: 113 U/L (ref 38–126)
Anion gap: 3 — ABNORMAL LOW (ref 5–15)
BUN: 18 mg/dL (ref 8–23)
CO2: 25 mmol/L (ref 22–32)
Calcium: 9.3 mg/dL (ref 8.9–10.3)
Chloride: 109 mmol/L (ref 98–111)
Creatinine, Ser: 1.11 mg/dL (ref 0.61–1.24)
GFR calc Af Amer: >60 mL/min (ref >60)
GFR calc non Af Amer: >60 mL/min (ref >60)
Glucose, Bld: 134 mg/dL — ABNORMAL HIGH (ref 70–99)
POTASSIUM: 5.3 mmol/L — AB (ref 3.5–5.1)
Sodium: 137 mmol/L (ref 135–145)
Total Bilirubin: 0.3 mg/dL (ref 0.3–1.2)
Total Protein: 6.5 g/dL (ref 6.5–8.1)

## 2018-07-25 LAB — CBC
HCT: 27.8 % — ABNORMAL LOW (ref 39.0–52.0)
Hemoglobin: 8.3 g/dL — ABNORMAL LOW (ref 13.0–17.0)
MCH: 18.9 pg — ABNORMAL LOW (ref 26.0–34.0)
MCHC: 29.9 g/dL — ABNORMAL LOW (ref 30.0–36.0)
MCV: 63.5 fL — ABNORMAL LOW (ref 80.0–100.0)
Platelets: 172 10*3/uL (ref 150–400)
RBC: 4.38 MIL/uL (ref 4.22–5.81)
RDW: 15 % (ref 11.5–15.5)
WBC: 7.7 10*3/uL (ref 4.0–10.5)
nRBC: 0 % (ref 0.0–0.2)

## 2018-07-25 LAB — HEMOGLOBIN A1C
Hgb A1c MFr Bld: 9.7 % — ABNORMAL HIGH (ref 4.8–5.6)
Mean Plasma Glucose: 231.69 mg/dL

## 2018-07-25 LAB — POTASSIUM: Potassium: 4.6 mmol/L (ref 3.5–5.1)

## 2018-07-25 LAB — GLUCOSE, CAPILLARY
Glucose-Capillary: 99 mg/dL (ref 70–99)
Glucose-Capillary: 192 mg/dL — ABNORMAL HIGH (ref 70–99)

## 2018-07-25 MED ORDER — METOPROLOL TARTRATE 50 MG PO TABS
50.0000 mg | ORAL_TABLET | Freq: Two times a day (BID) | ORAL | Status: DC
  Administered 2018-07-25: 50 mg via ORAL
  Filled 2018-07-25: qty 1

## 2018-07-25 MED ORDER — NITROGLYCERIN 0.4 MG SL SUBL: 0.4000 mg | SUBLINGUAL_TABLET | SUBLINGUAL | Status: DC | PRN

## 2018-07-25 MED ORDER — GLIMEPIRIDE 2 MG PO TABS
2.0000 mg | ORAL_TABLET | Freq: Two times a day (BID) | ORAL | Status: DC
  Filled 2018-07-25 (×2): qty 3

## 2018-07-25 MED ORDER — PATIROMER SORBITEX CALCIUM 8.4 G PO PACK
8.4000 g | PACK | Freq: Once | ORAL | Status: AC
  Administered 2018-07-25: 8.4 g via ORAL
  Filled 2018-07-25: qty 1

## 2018-07-25 MED ORDER — HYDROXYZINE HCL 25 MG PO TABS: 50.0000 mg | ORAL_TABLET | Freq: Every day | ORAL | Status: DC

## 2018-07-25 MED ORDER — ALPRAZOLAM 0.5 MG PO TABS: 0.5000 mg | ORAL_TABLET | Freq: Every day | ORAL | Status: DC

## 2018-07-25 MED ORDER — HYDROXYZINE HCL 25 MG PO TABS: 25.0000 mg | ORAL_TABLET | Freq: Two times a day (BID) | ORAL | Status: DC

## 2018-07-25 MED ORDER — GLIMEPIRIDE 2 MG PO TABS
2.0000 mg | ORAL_TABLET | Freq: Every evening | ORAL | Status: DC
  Filled 2018-07-25: qty 1

## 2018-07-25 MED ORDER — GABAPENTIN 400 MG PO CAPS
1200.0000 mg | ORAL_CAPSULE | Freq: Every day | ORAL | Status: DC
  Administered 2018-07-25: 1200 mg via ORAL
  Filled 2018-07-25: qty 3

## 2018-07-25 MED ORDER — PHENOBARBITAL 32.4 MG PO TABS
64.8000 mg | ORAL_TABLET | Freq: Three times a day (TID) | ORAL | Status: DC
  Administered 2018-07-25: 64.8 mg via ORAL
  Filled 2018-07-25: qty 2

## 2018-07-25 MED ORDER — GLIMEPIRIDE 4 MG PO TABS
4.0000 mg | ORAL_TABLET | Freq: Every day | ORAL | Status: DC
  Administered 2018-07-25: 4 mg via ORAL
  Filled 2018-07-25: qty 1

## 2018-07-25 MED ORDER — PANTOPRAZOLE SODIUM 40 MG PO TBEC
40.0000 mg | DELAYED_RELEASE_TABLET | Freq: Every day | ORAL | Status: DC
  Administered 2018-07-25: 40 mg via ORAL
  Filled 2018-07-25: qty 1

## 2018-07-25 MED ORDER — VITAMIN D 25 MCG (1000 UNIT) PO TABS
1000.0000 [IU] | ORAL_TABLET | Freq: Every day | ORAL | Status: DC
  Administered 2018-07-25: 1000 [IU] via ORAL
  Filled 2018-07-25: qty 1

## 2018-07-25 MED ORDER — HYDROXYZINE HCL 25 MG PO TABS
25.0000 mg | ORAL_TABLET | Freq: Every day | ORAL | Status: DC
  Administered 2018-07-25: 25 mg via ORAL
  Filled 2018-07-25: qty 1

## 2018-07-25 MED ORDER — AMLODIPINE BESYLATE 10 MG PO TABS
10.0000 mg | ORAL_TABLET | Freq: Every day | ORAL | Status: DC
  Administered 2018-07-25: 10 mg via ORAL
  Filled 2018-07-25: qty 1

## 2018-07-25 MED ORDER — OMEGA-3 1000 MG PO CAPS: 1000.0000 mg | ORAL_CAPSULE | Freq: Every day | ORAL | Status: DC

## 2018-07-25 MED ORDER — RISAQUAD PO CAPS
1.0000 | ORAL_CAPSULE | Freq: Every day | ORAL | Status: DC
  Administered 2018-07-25: 1 via ORAL
  Filled 2018-07-25: qty 1

## 2018-07-25 MED ORDER — VITAMIN B-12 1000 MCG PO TABS
1000.0000 ug | ORAL_TABLET | Freq: Every day | ORAL | Status: DC
  Administered 2018-07-25: 1000 ug via ORAL
  Filled 2018-07-25: qty 1

## 2018-07-25 MED ORDER — ALPRAZOLAM 0.5 MG PO TABS
0.5000 mg | ORAL_TABLET | Freq: Every morning | ORAL | Status: DC
  Administered 2018-07-25: 0.5 mg via ORAL
  Filled 2018-07-25: qty 1

## 2018-07-25 MED ORDER — GABAPENTIN 300 MG PO CAPS
300.0000 mg | ORAL_CAPSULE | Freq: Every day | ORAL | Status: DC
  Administered 2018-07-25: 300 mg via ORAL
  Filled 2018-07-25: qty 1

## 2018-07-25 MED ORDER — ATORVASTATIN CALCIUM 20 MG PO TABS
20.0000 mg | ORAL_TABLET | Freq: Every day | ORAL | Status: DC
  Administered 2018-07-25: 20 mg via ORAL
  Filled 2018-07-25: qty 1

## 2018-07-25 MED ORDER — SULFAMETHOXAZOLE-TRIMETHOPRIM 800-160 MG PO TABS
1.0000 | ORAL_TABLET | Freq: Two times a day (BID) | ORAL | Status: DC
  Administered 2018-07-25 (×2): 1 via ORAL
  Filled 2018-07-25 (×3): qty 1

## 2018-07-25 MED ORDER — PAROXETINE HCL 20 MG PO TABS
20.0000 mg | ORAL_TABLET | Freq: Every day | ORAL | Status: DC
  Administered 2018-07-25: 20 mg via ORAL
  Filled 2018-07-25: qty 1

## 2018-07-25 MED ORDER — OMEGA-3-ACID ETHYL ESTERS 1 G PO CAPS
1.0000 g | ORAL_CAPSULE | Freq: Every day | ORAL | Status: DC
  Administered 2018-07-25: 1 g via ORAL
  Filled 2018-07-25: qty 1

## 2018-07-25 MED ORDER — ALPRAZOLAM 0.5 MG PO TABS: 1.0000 mg | ORAL_TABLET | Freq: Every day | ORAL | Status: DC

## 2018-07-25 NOTE — Progress Notes (Signed)
PT Cancellation Note  Patient Details Name: Mike [name] MRN: [id number] DOB: [birth year]   Cancelled Treatment:    Reason Eval/Treat Not Completed: Other (comment)(Per lab results this AM, patient's potassium level 5.3. PT to hold, and will follow up as able when pt is more medically appropriate.)   Lieutenant Diego PT, DPT 8:21 AM,07/25/18 [phone]

## 2018-07-25 NOTE — Care Management Obs Status (Signed)
[facility] NOTIFICATION   Patient Details  Name: Mike [name] MRN: [id number] Date of Birth: [birth year]   Medicare Observation Status Notification Given:       Beverly Sessions, RN 07/25/2018, 11:32 AM

## 2018-07-25 NOTE — Discharge Instructions (Signed)
Hyperkalemia  Hyperkalemia is when you have too much potassium in your blood. Potassium helps your body in many ways, but having too much can cause problems. If there is too much potassium in your blood, it can affect how your heart works.  Potassium is normally removed from your body by your kidneys. Many things can cause the amount in your blood to be high. Medicines and other treatments can be used to bring the amount to a normal level. Treatment may need to be done in the hospital.  Follow these instructions at home:    · Take over-the-counter and prescription medicines only as told by your doctor.  · Do not take any of the following unless your doctor says it is okay:  ? Supplements.  ? Natural products.  ? Herbs.  ? Vitamins.  · Limit how much alcohol you drink as told by your doctor.  · Do not use drugs. If you need help quitting, ask your doctor.  · If you have kidney disease, you may need to follow a low-potassium diet. A food specialist (dietitian) can help you.  · Keep all follow-up visits as told by your doctor. This is important.  Contact a doctor if:  · Your heartbeat is not regular or is very slow.  · You feel dizzy (light-headed).  · You feel weak.  · You feel sick to your stomach (nauseous).  · You have tingling in your hands or feet.  · You lose feeling (have numbness) in your hands or feet.  Get help right away if:  · You are short of breath.  · You have chest pain.  · You pass out (faint).  · You cannot move your muscles.  Summary  · Hyperkalemia is when you have too much potassium in your blood.  · Take over-the-counter and prescription medicines only as told by your doctor.  · Limit how much alcohol you drink as told by your doctor.  · Contact a doctor if your heartbeat is not regular.  This information is not intended to replace advice given to you by your health care provider. Make sure you discuss any questions you have with your health care provider.  Document Released: 06/05/2005 Document  Revised: 05/21/2017 Document Reviewed: 05/21/2017  Elsevier Interactive Patient Education © 2019 Elsevier Inc.

## 2018-07-25 NOTE — Progress Notes (Signed)
07/25/2018 1:14 PM  Mike [name] to be D/C'd Home per MD order.  Discussed prescriptions and follow up appointments with the patient. Prescriptions given to patient, medication list explained in detail. Pt verbalized understanding.  Allergies as of 07/25/2018      Reactions   Aleve [naproxen] Nausea And Vomiting   Motrin [ibuprofen] Nausea Only      Medication List    TAKE these medications   acidophilus Caps capsule Take 1 capsule by mouth daily.   ALPRAZolam 0.5 MG tablet Commonly known as:  XANAX Take 0.5 mg by mouth every morning. What changed:    Another medication with the same name was changed. Make sure you understand how and when to take each.  Another medication with the same name was removed. Continue taking this medication, and follow the directions you see here.   ALPRAZolam 0.5 MG tablet Commonly known as:  XANAX Take 1 tablet (0.5 mg total) by mouth daily. What changed:    how much to take  when to take this  Another medication with the same name was removed. Continue taking this medication, and follow the directions you see here.   amLODipine 10 MG tablet Commonly known as:  NORVASC Take 1 tablet (10 mg total) by mouth daily.   atorvastatin 20 MG tablet Commonly known as:  LIPITOR Take 20 mg by mouth daily.   gabapentin 300 MG capsule Commonly known as:  NEURONTIN Take 300 mg by mouth daily.   gabapentin 300 MG capsule Commonly known as:  NEURONTIN Take 1,200 mg by mouth at bedtime.   glimepiride 2 MG tablet Commonly known as:  AMARYL Take 2-6 mg by mouth 2 (two) times daily.   HYDROcodone-acetaminophen 5-325 MG tablet Commonly known as:  NORCO/VICODIN Take 1-2 tablets by mouth every 6 (six) hours as needed for moderate pain or severe pain.   hydrOXYzine 25 MG tablet Commonly known as:  ATARAX/VISTARIL Take 25-50 mg by mouth 2 (two) times daily.   irbesartan 150 MG tablet Commonly known as:  AVAPRO Take 150 mg by mouth daily.    metoprolol tartrate 50 MG tablet Commonly known as:  LOPRESSOR Take 50 mg by mouth 2 (two) times daily.   NITROSTAT 0.4 MG SL tablet Generic drug:  nitroGLYCERIN Take 0.4 mg by mouth as needed.   Omega-3 1000 MG Caps Take 1,000 mg by mouth daily.   omeprazole 20 MG capsule Commonly known as:  PRILOSEC Take 20 mg by mouth 2 (two) times daily.   PARoxetine 20 MG tablet Commonly known as:  PAXIL Take 1 tablet (20 mg total) by mouth daily.   PHENobarbital 64.8 MG tablet Commonly known as:  LUMINAL Take 1 tablet (64.8 mg total) by mouth 3 (three) times daily.   sulfamethoxazole-trimethoprim 800-160 MG tablet Commonly known as:  BACTRIM DS,SEPTRA DS Take 1 tablet by mouth 2 (two) times daily.   Vitamin B-12 CR 1000 MCG Tbcr Take 1,000 mcg by mouth daily.   VITAMIN D-1000 MAX ST 25 MCG (1000 UT) tablet Generic drug:  Cholecalciferol Take 1,000 Units by mouth daily.       Vitals:   07/25/18 0002 07/25/18 0425  BP: (!) 159/61 (!) 149/56  Pulse: (!) 51 (!) 51  Resp:  19  Temp: (!) 97.5 F (36.4 C) (!) 97.4 F (36.3 C)  SpO2: 100% 100%    Skin clean, dry and intact without evidence of skin break down, no evidence of skin tears noted. IV catheter discontinued intact. Site without signs  and symptoms of complications. Dressing and pressure applied. Pt denies pain at this time. No complaints noted.  An After Visit Summary was printed and given to the patient. Patient escorted via [place], and D/C home via private auto.  Dola Argyle

## 2018-07-25 NOTE — Care Management Note (Signed)
Case Management Note  Patient Details  Name: KYRILLOS ADAMS MRN: [id number] Date of Birth: [birth year]   Patient discharged home today with resumption of home health orders through [facility].  Corene Cornea with [facility] notified of discharge.   Subjective/Objective:                    Action/Plan:   Expected Discharge Date:  07/25/18               Expected Discharge Plan:  [place]  In-House Referral:     Discharge planning Services  CM Consult  Post Acute Care Choice:  Home Health, Resumption of Svcs/PTA Provider Choice offered to:  Patient  DME Arranged:    DME Agency:     HH Arranged:  RN [place] Agency:  [place]  Status of Service:  Completed, signed off  If discussed at [place] of Stay Meetings, dates discussed:    Additional Comments:  Beverly Sessions, RN 07/25/2018, 2:35 PM

## 2018-07-25 NOTE — Care Management Obs Status (Signed)
[place] NOTIFICATION   Patient Details  Name: Mike [name] MRN: [id number] Date of Birth: 05-25-1946   Medicare Observation Status Notification Given:  Yes    Beverly Sessions, RN 07/25/2018, 11:35 AM

## 2018-07-25 NOTE — Care Management CC44 (Signed)
Condition Code 44 Documentation Completed  Patient Details  Name: Mike [name] MRN: [id number] Date of Birth: Feb 28, 1946   Condition Code 44 given:  Yes Patient signature on Condition Code 44 notice:  Yes Documentation of 2 MD's agreement:  Yes Code 44 added to claim:  Yes    Beverly Sessions, RN 07/25/2018, 11:35 AM

## 2018-07-25 NOTE — Consult Note (Signed)
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  Seen by podiatry for management.  Wound type:neuropathic Pressure Injury POA: Yes Measurement:Right great toe, medial aspect:  0.5 cm calloused lesion with serosanguinous weeping at distal aspect Right plantar foot near second metatarsal:  0.3 cm opening, depth 0.3 cm.  Narrow tunnel  Wound DEY:[id number] to visualize (see above) Drainage (amount, consistency, odor) Minimal serosanguinous  No odor Periwound: Chronic edema and erythema, per patient Dressing procedure/placement/frequency: Cleanse wounds to right foot with NS.  Gently pack opening to plantar foot with packing strip.  Place small piece packing strip over callous to medial foot.  Cover with gauze for padding.  Wrap with kerlix to cover and pad/protect.  Secure with ace bandage. Change daily.  Will not follow at this time.  Please re-consult if needed.  Domenic Moras MSN, RN, FNP-BC [facility]ON Wound, Ostomy, Continence Nurse Pager [phone]

## 2018-07-25 NOTE — Evaluation (Signed)
Physical Therapy Evaluation Patient Details Name: USBALDO PANNONE MRN: [id number] DOB: 08-05-1945 Today's Date: 07/25/2018   History of Present Illness  73 y.o. male here with acute kidney injury, elevated potassium.  He was here 5 weeks ago with R foot wound, still wearing post-op boot. History of epididymitis type II, neuropathy, hypertension, has been dealing with worsening right foot infection.   Clinical Impression  Pt did relatively well with PT exam but did display consistent low level unsteadiness t/o the prolonged bout of ambulation and stair negotiation.  He did not have any overt LOBs but showed significant reliance on UEs (on walker) and despite plenty of gait training/cuing he had inconsistent cadence, occasional missteps and generally showed come balance issues that should be further addressed to get back toward baseline.  Pt is safe to return home (encouraged to use walker) with the assist he has available.   Follow Up Recommendations Home health PT    Equipment Recommendations  None recommended by PT    Recommendations for Other Services       Precautions / Restrictions Restrictions Weight Bearing Restrictions: No      Mobility  Bed Mobility Overal bed mobility: Modified Independent                Transfers Overall transfer level: Modified independent Equipment used: Rolling walker (2 wheeled)             General transfer comment: cues for hand placement and sequencing/safety  Ambulation/Gait Ambulation/Gait assistance: Supervision Gait Distance (Feet): 200 Feet Assistive device: Rolling walker (2 wheeled)       General Gait Details: Pt with occasional unsteadiness that did not require direct assist to remain safe, but enough to cause some minor safety concerns.  He struggled to follow cuing/traing to make adjustments.  Stairs Stairs: Yes Stairs assistance: Min guard Stair Management: Two rails Number of Stairs: 6 General stair comments: Pt was  able to negotiate up/down steps relatively well - inconsistent about step-to vs reciprocal despite multiple attempts to encourage safer step-to strategy.   Wheelchair Mobility    Modified Rankin (Stroke Patients Only)       Balance Overall balance assessment: Needs assistance Sitting-balance support: Single extremity supported Sitting balance-Leahy Scale: Normal       Standing balance-Leahy Scale: Fair Standing balance comment: reliant on UEs (walker) occasional unsteadiness but no need for direct assist to maintain standing                             Pertinent Vitals/Pain Pain Assessment: (has chronic R hip pain, foot not too bad)    Home Living Family/patient expects to be discharged to:: Private residence Living Arrangements: Alone Available Help at Discharge: Family(sister checks on him daily) Type of Home: House Home Access: Stairs to enter Entrance Stairs-Rails: (holds door jam, does not have rails) Entrance Stairs-Number of Steps: 1 Home Layout: One level Home Equipment: [place] - 2 wheels;Cane - single point      Prior Function Level of Independence: Independent         Comments: Pt does not walk long distances, able to manage in the home well     Hand Dominance        Extremity/Trunk Assessment   Upper Extremity Assessment Upper Extremity Assessment: Generalized weakness    Lower Extremity Assessment Lower Extremity Assessment: Generalized weakness       Communication   Communication: No difficulties  Cognition Arousal/Alertness: Awake/alert Behavior  During Therapy: WFL for tasks assessed/performed Overall Cognitive Status: Within Functional Limits for tasks assessed                                        General Comments      Exercises     Assessment/Plan    PT Assessment Patient needs continued PT services  PT Problem List Decreased strength;Decreased range of motion;Decreased activity  tolerance;Decreased mobility;Decreased balance;Decreased cognition;Decreased knowledge of use of DME;Decreased safety awareness       PT Treatment Interventions DME instruction;Gait training;Stair training;Functional mobility training;Therapeutic activities;Therapeutic exercise;Balance training;Neuromuscular re-education;Patient/family education;Cognitive remediation    PT Goals (Current goals can be found in the Care Plan section)  Acute Rehab PT Goals Patient Stated Goal: go home PT Goal Formulation: With patient Time For Goal Achievement: 08/08/18 Potential to Achieve Goals: Good    Frequency Min 2X/week   Barriers to discharge        Co-evaluation               AM-PAC PT "6 Clicks" Mobility  Outcome Measure Help needed turning from your back to your side while in a flat bed without using bedrails?: None Help needed moving from lying on your back to sitting on the side of a flat bed without using bedrails?: None Help needed moving to and from a bed to a chair (including a wheelchair)?: None Help needed standing up from a chair using your arms (e.g., wheelchair or bedside chair)?: A Little Help needed to walk in hospital room?: A Little Help needed climbing 3-5 steps with a railing? : A Little 6 Click Score: 21    End of Session Equipment Utilized During Treatment: Gait belt Activity Tolerance: Patient tolerated treatment well Patient left: in chair;with call bell/phone within reach Nurse Communication: Mobility status PT Visit Diagnosis: Unsteadiness on feet (R26.81);Difficulty in walking, not elsewhere classified (R26.2)    Time: 8315-1761 PT Time Calculation (min) (ACUTE ONLY): 20 min   Charges:   PT Evaluation $PT Eval Low Complexity: 1 Low          Kreg Shropshire, DPT 07/25/2018, 12:14 PM

## 2018-07-26 NOTE — Discharge Summary (Addendum)
[facility] [place] NAME: Mike [name]    MR#:  [id number]  DATE OF BIRTH:  10-30-1945  DATE OF ADMISSION:  07/24/2018   ADMITTING PHYSICIAN: Nicholes Mango, MD  DATE OF DISCHARGE: 07/25/2018  1:28 PM  PRIMARY CARE PHYSICIAN: Adin Hector, MD   ADMISSION DIAGNOSIS:  Dehydration [E86.0] Generalized weakness [R53.1] DISCHARGE DIAGNOSIS:  Active Problems:   AKI (acute kidney injury) ([place])  SECONDARY DIAGNOSIS:   Past Medical History:  Diagnosis Date  . Anxiety   . Diabetes mellitus without complication ([place])   . Epilepsy ([place])   . High cholesterol   . Hypertension   . Prostate cancer [facility]) 2004   prostate removed   HOSPITAL COURSE:  73 y.o. male with a known history of diabetes mellitus, hypertension, hyperlipidemia remote history of prostate cancer is presenting to the ED with a chief complaint of generalized weakness associated with the lightheadedness.  #Acute kidney injury - prerenal, resolved with hydration  #Hyperkalemia: resolved s/p 2 dose of [place] Held ARB  #Near-syncope Resolved. Walked in [facility]ways without any recurrence of symptoms  #Essential hypertension Controlled  #Diabetes mellitus - controlled  #Anxiety -lorazepam as needed  #Diabetic neuropathy continue Neurontin  DISCHARGE CONDITIONS:  stable CONSULTS OBTAINED:   DRUG ALLERGIES:   Allergies  Allergen Reactions  . Aleve [Naproxen] Nausea And Vomiting  . Motrin [Ibuprofen] Nausea Only   DISCHARGE MEDICATIONS:   Allergies as of 07/25/2018      Reactions   Aleve [naproxen] Nausea And Vomiting   Motrin [ibuprofen] Nausea Only      Medication List    TAKE these medications   acidophilus Caps capsule Take 1 capsule by mouth daily.   ALPRAZolam 0.5 MG tablet Commonly known as:  XANAX Take 0.5 mg by mouth every morning. What changed:    Another medication with the same name was changed. Make sure you understand how and when to  take each.  Another medication with the same name was removed. Continue taking this medication, and follow the directions you see here.   ALPRAZolam 0.5 MG tablet Commonly known as:  XANAX Take 1 tablet (0.5 mg total) by mouth daily. What changed:    how much to take  when to take this  Another medication with the same name was removed. Continue taking this medication, and follow the directions you see here.   amLODipine 10 MG tablet Commonly known as:  NORVASC Take 1 tablet (10 mg total) by mouth daily.   atorvastatin 20 MG tablet Commonly known as:  LIPITOR Take 20 mg by mouth daily.   gabapentin 300 MG capsule Commonly known as:  NEURONTIN Take 300 mg by mouth daily.   gabapentin 300 MG capsule Commonly known as:  NEURONTIN Take 1,200 mg by mouth at bedtime.   glimepiride 2 MG tablet Commonly known as:  AMARYL Take 2-6 mg by mouth 2 (two) times daily.   HYDROcodone-acetaminophen 5-325 MG tablet Commonly known as:  NORCO/VICODIN Take 1-2 tablets by mouth every 6 (six) hours as needed for moderate pain or severe pain.   hydrOXYzine 25 MG tablet Commonly known as:  ATARAX/VISTARIL Take 25-50 mg by mouth 2 (two) times daily.   irbesartan 150 MG tablet Commonly known as:  AVAPRO Take 150 mg by mouth daily.   metoprolol tartrate 50 MG tablet Commonly known as:  LOPRESSOR Take 50 mg by mouth 2 (two) times daily.   NITROSTAT 0.4 MG SL tablet Generic drug:  nitroGLYCERIN Take 0.4 mg by mouth as needed.   Omega-3 1000 MG Caps Take 1,000 mg by mouth daily.   omeprazole 20 MG capsule Commonly known as:  PRILOSEC Take 20 mg by mouth 2 (two) times daily.   PARoxetine 20 MG tablet Commonly known as:  PAXIL Take 1 tablet (20 mg total) by mouth daily.   PHENobarbital 64.8 MG tablet Commonly known as:  LUMINAL Take 1 tablet (64.8 mg total) by mouth 3 (three) times daily.   sulfamethoxazole-trimethoprim 800-160 MG tablet Commonly known as:  BACTRIM DS,SEPTRA  DS Take 1 tablet by mouth 2 (two) times daily.   Vitamin B-12 CR 1000 MCG Tbcr Take 1,000 mcg by mouth daily.   VITAMIN D-1000 MAX ST 25 MCG (1000 UT) tablet Generic drug:  Cholecalciferol Take 1,000 Units by mouth daily.        DISCHARGE INSTRUCTIONS:   DIET:  Regular diet DISCHARGE CONDITION:  Good ACTIVITY:  Activity as tolerated OXYGEN:  Home Oxygen: No.  Oxygen Delivery: room air DISCHARGE LOCATION:  home with home health  If you experience worsening of your admission symptoms, develop shortness of breath, life threatening emergency, suicidal or homicidal thoughts you must seek medical attention immediately by calling 911 or calling your MD immediately  if symptoms less severe.  You Must read complete instructions/literature along with all the possible adverse reactions/side effects for all the Medicines you take and that have been prescribed to you. Take any new Medicines after you have completely understood and accpet all the possible adverse reactions/side effects.   Please note  You were cared for by a hospitalist during your hospital stay. If you have any questions about your discharge medications or the care you received while you were in the hospital after you are discharged, you can call the unit and asked to speak with the hospitalist on call if the hospitalist that took care of you is not available. Once you are discharged, your primary care physician will handle any further medical issues. Please note that NO REFILLS for any discharge medications will be authorized once you are discharged, as it is imperative that you return to your primary care physician (or establish a relationship with a primary care physician if you do not have one) for your aftercare needs so that they can reassess your need for medications and monitor your lab values.    On the day of Discharge:  VITAL SIGNS:  Blood pressure (!) 149/56, pulse (!) 51, temperature (!) 97.4 F (36.3 C),  temperature source Oral, resp. rate 19, height 5\' 7"  (1.702 m), weight 87.9 kg, SpO2 100 %. PHYSICAL EXAMINATION:  GENERAL:  73 y.o.-year-old patient lying in the bed with no acute distress.  EYES: Pupils equal, round, reactive to light and accommodation. No scleral icterus. Extraocular muscles intact.  HEENT: Head atraumatic, normocephalic. Oropharynx and nasopharynx clear.  NECK:  Supple, no jugular venous distention. No thyroid enlargement, no tenderness.  LUNGS: Normal breath sounds bilaterally, no wheezing, rales,rhonchi or crepitation. No use of accessory muscles of respiration.  CARDIOVASCULAR: S1, S2 normal. No murmurs, rubs, or gallops.  ABDOMEN: Soft, non-tender, non-distended. Bowel sounds present. No organomegaly or mass.  EXTREMITIES: No pedal edema, cyanosis, or clubbing.  NEUROLOGIC: Cranial nerves II through XII are intact. Muscle strength 5/5 in all extremities. Sensation intact. Gait not checked.  PSYCHIATRIC: The patient is alert and oriented x 3.  SKIN: No obvious rash, lesion, or ulcer.  DATA REVIEW:   CBC Recent Labs  Lab 07/25/18 0510  WBC 7.7  HGB 8.3*  HCT 27.8*  PLT 172    Chemistries  Recent Labs  Lab 07/25/18 0510 07/25/18 0935  NA 137  --   K 5.3* 4.6  CL 109  --   CO2 25  --   GLUCOSE 134*  --   BUN 18  --   CREATININE 1.11  --   CALCIUM 9.3  --   AST 20  --   ALT 20  --   ALKPHOS 113  --   BILITOT 0.3  --      Follow-up Information    Adin Hector, MD. Go on 08/01/2018.   Specialty:  Internal Medicine Why:  Keep appointment for 2/13 at 8:45am Contact information: [place] [address] [phone]            Management plans discussed with the patient, family and they are in agreement.  CODE STATUS: Prior   TOTAL TIME TAKING CARE OF THIS PATIENT: 45 minutes.    Max Sane M.D on 07/26/2018 at 8:28 AM  Between 7am to 6pm - Pager - [phone]  After 6pm go to  [email or web address] - password [email or web address]  [facility]  Office  [phone]  CC: Primary care physician; Adin Hector, MD   Note: This dictation was prepared with Dragon dictation along with smaller phrase technology. Any transcriptional errors that result from this process are unintentional.

## 2018-10-24 ENCOUNTER — Inpatient Hospital Stay: Payer: POS | Admitting: Oncology

## 2018-12-24 ENCOUNTER — Inpatient Hospital Stay: Payer: Medicare Other | Admitting: Oncology

## 2019-01-22 ENCOUNTER — Encounter: Payer: POS | Admitting: Oncology

## 2019-02-14 ENCOUNTER — Emergency Department: Payer: Medicare Other

## 2019-02-14 ENCOUNTER — Other Ambulatory Visit: Payer: Self-pay

## 2019-02-14 ENCOUNTER — Encounter: Payer: Self-pay | Admitting: Emergency Medicine

## 2019-02-14 ENCOUNTER — Emergency Department
Admission: EM | Admit: 2019-02-14 | Discharge: 2019-02-14 | Disposition: A | Discharge status: Home / Self Care | Payer: Medicare Other | Attending: Emergency Medicine | Admitting: Emergency Medicine

## 2019-02-14 DIAGNOSIS — I10 Essential (primary) hypertension: Secondary | ICD-10-CM | POA: Insufficient documentation

## 2019-02-14 DIAGNOSIS — Z886 Allergy status to analgesic agent status: Secondary | ICD-10-CM | POA: Insufficient documentation

## 2019-02-14 DIAGNOSIS — E119 Type 2 diabetes mellitus without complications: Secondary | ICD-10-CM | POA: Insufficient documentation

## 2019-02-14 DIAGNOSIS — M79604 Pain in right leg: Secondary | ICD-10-CM | POA: Diagnosis present

## 2019-02-14 DIAGNOSIS — Z79899 Other long term (current) drug therapy: Secondary | ICD-10-CM | POA: Diagnosis not present

## 2019-02-14 DIAGNOSIS — G40909 Epilepsy, unspecified, not intractable, without status epilepticus: Secondary | ICD-10-CM | POA: Diagnosis not present

## 2019-02-14 DIAGNOSIS — Z8546 Personal history of malignant neoplasm of prostate: Secondary | ICD-10-CM | POA: Diagnosis not present

## 2019-02-14 DIAGNOSIS — M79605 Pain in left leg: Secondary | ICD-10-CM | POA: Diagnosis not present

## 2019-02-14 DIAGNOSIS — Z7984 Long term (current) use of oral hypoglycemic drugs: Secondary | ICD-10-CM | POA: Insufficient documentation

## 2019-02-14 DIAGNOSIS — E782 Mixed hyperlipidemia: Secondary | ICD-10-CM | POA: Diagnosis not present

## 2019-02-14 MED ORDER — ACETAMINOPHEN-CODEINE #3 300-30 MG PO TABS: 1.0000 | ORAL_TABLET | Freq: Three times a day (TID) | ORAL | 0 refills | Status: DC | PRN

## 2019-02-14 NOTE — ED Triage Notes (Signed)
Pt arrived to ED via EMS with c/o bilateral lower leg pain from his knees down. Onset of pain was Wednesday night. Pt states he doesn't get to walk "which is part of the problem". Denies pain with palpation or swelling/redness.

## 2019-02-14 NOTE — ED Provider Notes (Signed)
[facility] Emergency Department Provider Note    First MD Initiated Contact with Patient 02/14/19 [phone]     (approximate)  I have reviewed the triage vital signs and the nursing notes.   HISTORY  Chief Complaint Leg Pain    HPI Mike [name] is a 73 y.o. male with below listed previous medical conditions including diabetic neuropathy presents to the emergency department with bilateral lower extremity pain which patient states is been occurring since Wednesday night.  Patient denies any pain at present however states when the pain does occur that it is severe.  Patient denies any lower extremity swelling.  Patient denies any chest pain or shortness of breath.  No history of DVT or PE.  In addition patient is requesting Tylenol 3 which she states that he takes for a chronic cough        Past Medical History:  Diagnosis Date   Anxiety    Diabetes mellitus without complication ([place])    Epilepsy ([place])    High cholesterol    Hypertension    Prostate cancer ([place]) 2004   prostate removed    Patient Active Problem List   Diagnosis Date Noted   AKI (acute kidney injury) ([place]) 07/24/2018   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 ([place]) 06/13/2018    Past Surgical History:  Procedure Laterality Date   CARDIAC SURGERY     IRRIGATION AND DEBRIDEMENT FOOT Right 06/14/2018   Procedure: IRRIGATION AND DEBRIDEMENT FOOT;  Surgeon: Sharlotte Alamo, DPM;  Location: [facility] ORS;  Service: Podiatry;  Laterality: Right;    Prior to Admission medications   Medication Sig Start Date End Date Taking? Authorizing Provider  acetaminophen-codeine (TYLENOL #3) 300-30 MG tablet Take 1 tablet by mouth every 8 (eight) hours as needed for moderate pain. 02/14/19   Gregor Hams, MD  acidophilus (RISAQUAD) CAPS capsule Take 1 capsule by mouth daily. 06/17/18   Gladstone Lighter, MD  ALPRAZolam Duanne Moron) 0.5 MG tablet Take 1 tablet (0.5 mg total) by mouth daily. Patient taking  differently: Take 1 mg by mouth at bedtime.  06/18/18   Gladstone Lighter, MD  ALPRAZolam Duanne Moron) 0.5 MG tablet Take 0.5 mg by mouth every morning.    [provider]  amLODipine (NORVASC) 10 MG tablet Take 1 tablet (10 mg total) by mouth daily. 06/18/18   Gladstone Lighter, MD  atorvastatin (LIPITOR) 20 MG tablet Take 20 mg by mouth daily.    [provider]  Cholecalciferol (VITAMIN D-1000 MAX ST) 25 MCG (1000 UT) tablet Take 1,000 Units by mouth daily. 08/30/12   [provider]  Cyanocobalamin (VITAMIN B-12 CR) 1000 MCG TBCR Take 1,000 mcg by mouth daily. 12/27/10   [provider]  gabapentin (NEURONTIN) 300 MG capsule Take 300 mg by mouth daily.    [provider]  gabapentin (NEURONTIN) 300 MG capsule Take 1,200 mg by mouth at bedtime.    [provider]  glimepiride (AMARYL) 2 MG tablet Take 2-6 mg by mouth 2 (two) times daily.  07/19/18   [provider]  HYDROcodone-acetaminophen (NORCO/VICODIN) 5-325 MG tablet Take 1-2 tablets by mouth every 6 (six) hours as needed for moderate pain or severe pain. Patient not taking: Reported on 07/24/2018 06/17/18   Gladstone Lighter, MD  hydrOXYzine (ATARAX/VISTARIL) 25 MG tablet Take 25-50 mg by mouth 2 (two) times daily. 01/05/15   [provider]  irbesartan (AVAPRO) 150 MG tablet Take 150 mg by mouth daily.    [provider]  metoprolol  tartrate (LOPRESSOR) 50 MG tablet Take 50 mg by mouth 2 (two) times daily. 03/28/17   [provider]  nitroGLYCERIN (NITROSTAT) 0.4 MG SL tablet Take 0.4 mg by mouth as needed. 07/24/14   [provider]  Omega-3 1000 MG CAPS Take 1,000 mg by mouth daily. 12/23/10   [provider]  omeprazole (PRILOSEC) 20 MG capsule Take 20 mg by mouth 2 (two) times daily. 12/19/14   [provider]  PARoxetine (PAXIL) 20 MG tablet Take 1 tablet (20 mg total) by mouth daily. 06/18/18   Gladstone Lighter, MD    PHENobarbital (LUMINAL) 64.8 MG tablet Take 1 tablet (64.8 mg total) by mouth 3 (three) times daily. 06/17/18   Gladstone Lighter, MD  sulfamethoxazole-trimethoprim (BACTRIM DS,SEPTRA DS) 800-160 MG tablet Take 1 tablet by mouth 2 (two) times daily. 07/15/18   [provider]    Allergies Aleve [naproxen] and Motrin [ibuprofen]  No family history on file.  Social History Social History   Tobacco Use   Smoking status: Never Smoker   Smokeless tobacco: Never Used  Substance Use Topics   Alcohol use: No   Drug use: No    Review of Systems Constitutional: No fever/chills Eyes: No visual changes. ENT: No sore throat. Cardiovascular: Denies chest pain. Respiratory: Denies shortness of breath. Gastrointestinal: No abdominal pain.  No nausea, no vomiting.  No diarrhea.  No constipation. Genitourinary: Negative for dysuria. Musculoskeletal: Negative for neck pain.  Negative for back pain.  Positive for bilateral lower extremity pain Integumentary: Negative for rash. Neurological: Negative for headaches, focal weakness or numbness.   ____________________________________________   PHYSICAL EXAM:  VITAL SIGNS: ED Triage Vitals  Enc Vitals Group     BP 02/14/19 0345 (!) 122/55     Pulse Rate 02/14/19 0345 (!) 55     Resp 02/14/19 0345 18     Temp 02/14/19 0345 98.1 F (36.7 C)     Temp Source 02/14/19 0345 Oral     SpO2 02/14/19 0345 98 %     Weight 02/14/19 0346 90.7 kg (200 lb)     Height 02/14/19 0346 1.702 m (5\' 7" )     Head Circumference --      Peak Flow --      Pain Score 02/14/19 0345 8     Pain Loc --      Pain Edu? --      Excl. in [place]? --     Constitutional: Alert and oriented.  Eyes: Conjunctivae are normal.  Head: Atraumatic. Mouth/Throat: Mucous membranes are moist. Neck: No stridor.  No meningeal signs.   Cardiovascular: Normal rate, regular rhythm. Good peripheral circulation. Grossly normal heart sounds. Respiratory: Normal respiratory  effort.  No retractions. Gastrointestinal: Soft and nontender. No distention.  Musculoskeletal: No lower extremity tenderness nor edema. No gross deformities of extremities.  Palpable DP PT pulses bilaterally equal Neurologic:  Normal speech and language. No gross focal neurologic deficits are appreciated.  Skin:  Skin is warm, dry and intact. Psychiatric: Mood and affect are normal. Speech and behavior are normal.   RADIOLOGY I, [place], personally viewed and evaluated these images (plain radiographs) as part of my medical decision making, as well as reviewing the written report by the radiologist.  ED MD interpretation: No evidence of DVT or PE on bilateral extremity ultrasound.  Official radiology report(s): US Venous Img Lower Bilateral  Result Date: 02/14/2019 CLINICAL DATA:  Bilateral leg pain. EXAM: BILATERAL LOWER EXTREMITY VENOUS DOPPLER ULTRASOUND TECHNIQUE: Gray-scale sonography  with graded compression, as well as color Doppler and duplex ultrasound were performed to evaluate the lower extremity deep venous systems from the level of the common femoral vein and including the common femoral, femoral, profunda femoral, popliteal and calf veins including the posterior tibial, peroneal and gastrocnemius veins when visible. The superficial great saphenous vein was also interrogated. Spectral Doppler was utilized to evaluate flow at rest and with distal augmentation maneuvers in the common femoral, femoral and popliteal veins. COMPARISON:  No recent. FINDINGS: RIGHT LOWER EXTREMITY Common Femoral Vein: No evidence of thrombus. Normal compressibility, respiratory phasicity and response to augmentation. Saphenofemoral Junction: No evidence of thrombus. Normal compressibility and flow on color Doppler imaging. Profunda Femoral Vein: No evidence of thrombus. Normal compressibility and flow on color Doppler imaging. Femoral Vein: No evidence of thrombus. Normal compressibility, respiratory  phasicity and response to augmentation. Popliteal Vein: No evidence of thrombus. Normal compressibility, respiratory phasicity and response to augmentation. Calf Veins: No evidence of thrombus. Normal compressibility and flow on color Doppler imaging. Other Findings:  None. LEFT LOWER EXTREMITY Common Femoral Vein: No evidence of thrombus. Normal compressibility, respiratory phasicity and response to augmentation. Saphenofemoral Junction: No evidence of thrombus. Normal compressibility and flow on color Doppler imaging. Profunda Femoral Vein: No evidence of thrombus. Normal compressibility and flow on color Doppler imaging. Femoral Vein: No evidence of thrombus. Normal compressibility, respiratory phasicity and response to augmentation. Popliteal Vein: No evidence of thrombus. Normal compressibility, respiratory phasicity and response to augmentation. Calf Veins: No evidence of thrombus. Normal compressibility and flow on color Doppler imaging. Other Findings:  None. IMPRESSION: No evidence of deep venous thrombosis in either lower extremity. Electronically Signed   By: Marcello Moores  Register   On: 02/14/2019 06:15    Procedures   ____________________________________________   INITIAL IMPRESSION / MDM / [place] / ED COURSE  As part of my medical decision making, I reviewed the following data within the electronic MEDICAL RECORD NUMBER   73 year old male presented with above-stated history and physical exam secondary to lower extremity pain.  Patient has no pain at present.  Considered possibility DVT and a such ultrasound performed which was normal.  Suspect neuropathy is the patient's etiology for his pain.  Patient states that he has a follow-up appointment with Dr. Caryl Comes next week and I encouraged him to keep that appointment.      ____________________________________________  FINAL CLINICAL IMPRESSION(S) / ED DIAGNOSES  Final diagnoses:  Pain in both lower extremities     MEDICATIONS  GIVEN DURING THIS VISIT:  Medications - No data to display   ED Discharge Orders         Ordered    acetaminophen-codeine (TYLENOL #3) 300-30 MG tablet  Every 8 hours PRN     02/14/19 0636          *Please note:  Mike [name] was evaluated in Emergency Department on 02/14/2019 for the symptoms described in the history of present illness. He was evaluated in the context of the global COVID-19 pandemic, which necessitated consideration that the patient might be at risk for infection with the SARS-CoV-2 virus that causes COVID-19. Institutional protocols and algorithms that pertain to the evaluation of patients at risk for COVID-19 are in a state of rapid change based on information released by regulatory bodies including the CDC and federal and state organizations. These policies and algorithms were followed during the patient's care in the ED.  Some ED evaluations and interventions may be delayed as a result  of limited staffing during the pandemic.*  Note:  This document was prepared using Dragon voice recognition software and may include unintentional dictation errors.   Gregor Hams, MD 02/14/19 [phone]

## 2019-02-24 ENCOUNTER — Other Ambulatory Visit: Payer: Self-pay

## 2019-02-24 ENCOUNTER — Encounter: Payer: Self-pay | Admitting: Emergency Medicine

## 2019-02-24 DIAGNOSIS — R531 Weakness: Secondary | ICD-10-CM | POA: Diagnosis present

## 2019-02-24 DIAGNOSIS — G40909 Epilepsy, unspecified, not intractable, without status epilepticus: Secondary | ICD-10-CM | POA: Diagnosis not present

## 2019-02-24 DIAGNOSIS — I10 Essential (primary) hypertension: Secondary | ICD-10-CM | POA: Insufficient documentation

## 2019-02-24 DIAGNOSIS — E860 Dehydration: Secondary | ICD-10-CM | POA: Diagnosis not present

## 2019-02-24 DIAGNOSIS — Z79899 Other long term (current) drug therapy: Secondary | ICD-10-CM | POA: Insufficient documentation

## 2019-02-24 DIAGNOSIS — Z7984 Long term (current) use of oral hypoglycemic drugs: Secondary | ICD-10-CM | POA: Diagnosis not present

## 2019-02-24 DIAGNOSIS — E119 Type 2 diabetes mellitus without complications: Secondary | ICD-10-CM | POA: Diagnosis not present

## 2019-02-24 LAB — TROPONIN I (HIGH SENSITIVITY): Troponin I (High Sensitivity): 5 ng/L (ref <18)

## 2019-02-24 LAB — CBC
HCT: 26.5 % — ABNORMAL LOW (ref 39.0–52.0)
Hemoglobin: 8.2 g/dL — ABNORMAL LOW (ref 13.0–17.0)
MCH: 20.7 pg — ABNORMAL LOW (ref 26.0–34.0)
MCHC: 30.9 g/dL (ref 30.0–36.0)
MCV: 66.8 fL — ABNORMAL LOW (ref 80.0–100.0)
Platelets: 165 10*3/uL (ref 150–400)
RBC: 3.97 MIL/uL — ABNORMAL LOW (ref 4.22–5.81)
RDW: 18.6 % — ABNORMAL HIGH (ref 11.5–15.5)
WBC: 3.6 10*3/uL — ABNORMAL LOW (ref 4.0–10.5)
nRBC: 0.6 % — ABNORMAL HIGH (ref 0.0–0.2)

## 2019-02-24 LAB — BASIC METABOLIC PANEL
Anion gap: 10 (ref 5–15)
BUN: 17 mg/dL (ref 8–23)
CO2: 26 mmol/L (ref 22–32)
Calcium: 8.9 mg/dL (ref 8.9–10.3)
Chloride: 99 mmol/L (ref 98–111)
Creatinine, Ser: 1.19 mg/dL (ref 0.61–1.24)
GFR calc Af Amer: >60 mL/min (ref >60)
GFR calc non Af Amer: 60 mL/min — ABNORMAL LOW (ref >60)
Glucose, Bld: 79 mg/dL (ref 70–99)
Potassium: 4.1 mmol/L (ref 3.5–5.1)
Sodium: 135 mmol/L (ref 135–145)

## 2019-02-24 LAB — GLUCOSE, CAPILLARY: Glucose-Capillary: 64 mg/dL — ABNORMAL LOW (ref 70–99)

## 2019-02-24 NOTE — ED Triage Notes (Signed)
Pt arrived via EMS with reports of staggering when he was trying to get around in his office chair. Pt states he hadn't eaten anything and that is why he was staggering.  Pt states he fell due to his unsteady gait. Pt states he hit the back of his head and his forehead on a chair. Pt denies any LOC.

## 2019-02-24 NOTE — ED Notes (Signed)
Pt given [place] sandwich tray and gingerale for low blood sugar

## 2019-02-25 ENCOUNTER — Emergency Department
Admission: EM | Admit: 2019-02-25 | Discharge: 2019-02-25 | Disposition: A | Discharge status: Home / Self Care | Payer: Medicare Other | Attending: Emergency Medicine | Admitting: Emergency Medicine

## 2019-02-25 ENCOUNTER — Emergency Department: Payer: Medicare Other

## 2019-02-25 DIAGNOSIS — I951 Orthostatic hypotension: Secondary | ICD-10-CM

## 2019-02-25 DIAGNOSIS — E860 Dehydration: Secondary | ICD-10-CM

## 2019-02-25 NOTE — ED Provider Notes (Signed)
[facility] Emergency Department Provider Note  ____________________________________________   First MD Initiated Contact with Patient 02/25/19 0231     (approximate)  I have reviewed the triage vital signs and the nursing notes.   HISTORY  Chief Complaint Weakness    HPI Mike [name] is a 73 y.o. male with past medical history as below here with generalized weakness after standing.  The patient states that over the last 2 days, he has not really had much to eat.  He states that he is very sensitive to the smell of the animals and his brother has been hunting [place] and had around the house.  States his been trying to avoid food due to this.  He states that he is continue taking his Lasix.  He also had a beer last night, which she took to help with swelling in his legs.  He states that earlier today when he stood up after sitting for long amount of time, he felt very dizzy.  He felt like he was unsteady on his feet.  He sat back down and his symptoms resolved.  He states he is had similar        Past Medical History:  Diagnosis Date   Anxiety    Diabetes mellitus without complication ([place])    Epilepsy ([place])    High cholesterol    Hypertension    Prostate cancer ([place]) 2004   prostate removed    Patient Active Problem List   Diagnosis Date Noted   AKI (acute kidney injury) ([place]) 07/24/2018   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 ([place]) 06/13/2018    Past Surgical History:  Procedure Laterality Date   CARDIAC SURGERY     IRRIGATION AND DEBRIDEMENT FOOT Right 06/14/2018   Procedure: IRRIGATION AND DEBRIDEMENT FOOT;  Surgeon: Sharlotte Alamo, DPM;  Location: [facility] ORS;  Service: Podiatry;  Laterality: Right;    Prior to Admission medications   Medication Sig Start Date End Date Taking? Authorizing Provider  acetaminophen-codeine (TYLENOL #3) 300-30 MG tablet Take 1 tablet by mouth every 8 (eight) hours as needed for moderate pain. 02/14/19   Gregor Hams, MD  acidophilus (RISAQUAD) CAPS capsule Take 1 capsule by mouth daily. 06/17/18   Gladstone Lighter, MD  ALPRAZolam Duanne Moron) 0.5 MG tablet Take 1 tablet (0.5 mg total) by mouth daily. Patient taking differently: Take 1 mg by mouth at bedtime.  06/18/18   Gladstone Lighter, MD  ALPRAZolam Duanne Moron) 0.5 MG tablet Take 0.5 mg by mouth every morning.    [provider]  amLODipine (NORVASC) 10 MG tablet Take 1 tablet (10 mg total) by mouth daily. 06/18/18   Gladstone Lighter, MD  atorvastatin (LIPITOR) 20 MG tablet Take 20 mg by mouth daily.    [provider]  Cholecalciferol (VITAMIN D-1000 MAX ST) 25 MCG (1000 UT) tablet Take 1,000 Units by mouth daily. 08/30/12   [provider]  Cyanocobalamin (VITAMIN B-12 CR) 1000 MCG TBCR Take 1,000 mcg by mouth daily. 12/27/10   [provider]  gabapentin (NEURONTIN) 300 MG capsule Take 300 mg by mouth daily.    [provider]  gabapentin (NEURONTIN) 300 MG capsule Take 1,200 mg by mouth at bedtime.    [provider]  glimepiride (AMARYL) 2 MG tablet Take 2-6 mg by mouth 2 (two) times daily.  07/19/18   [provider]  HYDROcodone-acetaminophen (NORCO/VICODIN) 5-325 MG tablet Take 1-2 tablets by mouth every 6 (six) hours as needed for moderate pain or severe  pain. Patient not taking: Reported on 07/24/2018 06/17/18   Gladstone Lighter, MD  hydrOXYzine (ATARAX/VISTARIL) 25 MG tablet Take 25-50 mg by mouth 2 (two) times daily. 01/05/15   [provider]  irbesartan (AVAPRO) 150 MG tablet Take 150 mg by mouth daily.    [provider]  metoprolol tartrate (LOPRESSOR) 50 MG tablet Take 50 mg by mouth 2 (two) times daily. 03/28/17   [provider]  nitroGLYCERIN (NITROSTAT) 0.4 MG SL tablet Take 0.4 mg by mouth as needed. 07/24/14   [provider]  Omega-3 1000 MG CAPS Take 1,000 mg by mouth daily. 12/23/10   [provider]  omeprazole (PRILOSEC)  20 MG capsule Take 20 mg by mouth 2 (two) times daily. 12/19/14   [provider]  PARoxetine (PAXIL) 20 MG tablet Take 1 tablet (20 mg total) by mouth daily. 06/18/18   Gladstone Lighter, MD  PHENobarbital (LUMINAL) 64.8 MG tablet Take 1 tablet (64.8 mg total) by mouth 3 (three) times daily. 06/17/18   Gladstone Lighter, MD  sulfamethoxazole-trimethoprim (BACTRIM DS,SEPTRA DS) 800-160 MG tablet Take 1 tablet by mouth 2 (two) times daily. 07/15/18   [provider]    Allergies Naproxen sodium, Nsaids, Aleve [naproxen], and Ibuprofen  No family history on file.  Social History Social History   Tobacco Use   Smoking status: Never Smoker   Smokeless tobacco: Never Used  Substance Use Topics   Alcohol use: No   Drug use: No    Review of Systems  Review of Systems  Constitutional: Negative for chills, fatigue and fever.  HENT: Negative for sore throat.   Respiratory: Negative for shortness of breath.   Cardiovascular: Negative for chest pain.  Gastrointestinal: Negative for abdominal pain.  Genitourinary: Negative for flank pain.  Musculoskeletal: Negative for neck pain.  Skin: Negative for rash and wound.  Allergic/Immunologic: Negative for immunocompromised state.  Neurological: Positive for dizziness and light-headedness. Negative for weakness and numbness.  Hematological: Does not bruise/bleed easily.  All other systems reviewed and are negative.    ____________________________________________  PHYSICAL EXAM:      VITAL SIGNS: ED Triage Vitals  Enc Vitals Group     BP 02/24/19 2214 (!) 137/59     Pulse Rate 02/24/19 2214 (!) 49     Resp 02/24/19 2214 18     Temp 02/24/19 2214 98.3 F (36.8 C)     Temp Source 02/24/19 2214 Oral     SpO2 02/24/19 2214 99 %     Weight 02/24/19 2214 200 lb (90.7 kg)     Height 02/24/19 2214 5\' 7"  (1.702 m)     Head Circumference --      Peak Flow --      Pain Score 02/24/19 2213 2     Pain Loc --      Pain  Edu? --      Excl. in [place]? --      Physical Exam Vitals signs and nursing note reviewed.  Constitutional:      General: He is not in acute distress.    Appearance: He is well-developed.  HENT:     Head: Normocephalic and atraumatic.     Mouth/Throat:     Mouth: Mucous membranes are dry.  Eyes:     Conjunctiva/sclera: Conjunctivae normal.  Neck:     Musculoskeletal: Neck supple.  Cardiovascular:     Rate and Rhythm: Regular rhythm. Bradycardia present.     Heart sounds: Normal heart sounds. No murmur. No friction  rub.  Pulmonary:     Effort: Pulmonary effort is normal. No respiratory distress.     Breath sounds: Normal breath sounds. No wheezing or rales.  Abdominal:     General: There is no distension.     Palpations: Abdomen is soft.     Tenderness: There is no abdominal tenderness.  Musculoskeletal:     Right lower leg: Edema (trace) present.     Left lower leg: Edema (trace) present.  Skin:    General: Skin is warm.     Capillary Refill: Capillary refill takes less than 2 seconds.  Neurological:     Mental Status: He is alert and oriented to person, place, and time.     Motor: No abnormal muscle tone.       Neurological Exam:  Mental Status: Alert and oriented to person, place, and time. Attention and concentration normal. Speech clear. Recent memory is intact. Cranial Nerves: Visual fields grossly intact. EOMI and PERRLA. No nystagmus noted. Facial sensation intact at forehead, maxillary cheek, and chin/mandible bilaterally. No facial asymmetry or weakness. Hearing grossly normal. Uvula is midline, and palate elevates symmetrically. Normal SCM and trapezius strength. Tongue midline without fasciculations. Motor: Muscle strength 5/5 in proximal and distal UE and LE bilaterally. No pronator drift. Muscle tone normal. Reflexes: 2+ and symmetrical in all four extremities.  Sensation: Intact to light touch in upper and lower extremities distally bilaterally.  Gait: Normal  without ataxia. Coordination: Normal FTN bilaterally.     ____________________________________________   LABS (all labs ordered are listed, but only abnormal results are displayed)  Labs Reviewed  BASIC METABOLIC PANEL - Abnormal; Notable for the following components:      Result Value   GFR calc non Af Amer 60 (*)    All other components within normal limits  CBC - Abnormal; Notable for the following components:   WBC 3.6 (*)    RBC 3.97 (*)    Hemoglobin 8.2 (*)    HCT 26.5 (*)    MCV 66.8 (*)    MCH 20.7 (*)    RDW 18.6 (*)    nRBC 0.6 (*)    All other components within normal limits  GLUCOSE, CAPILLARY - Abnormal; Notable for the following components:   Glucose-Capillary 64 (*)    All other components within normal limits  URINALYSIS, COMPLETE (UACMP) WITH MICROSCOPIC  CBG MONITORING, ED  TROPONIN I (HIGH SENSITIVITY)    ____________________________________________  EKG: sinus bradycardia, VR 51. QRS 82, QTc 449. No ischemic changes. No change from prior. ________________________________________  RADIOLOGY All imaging, including plain films, CT scans, and ultrasounds, independently reviewed by me, and interpretations confirmed via formal radiology reads.  ED MD interpretation:   CT Head: [place]  Official radiology report(s): Ct Head Wo Contrast  Result Date: 02/25/2019 CLINICAL DATA:  Dizzy and fall EXAM: CT HEAD WITHOUT CONTRAST TECHNIQUE: Contiguous axial images were obtained from the base of the skull through the vertex without intravenous contrast. COMPARISON:  July 24, 2018 FINDINGS: Brain: No evidence of acute territorial infarction, hemorrhage, hydrocephalus,extra-axial collection or mass lesion/mass effect. There is dilatation the ventricles and sulci consistent with age-related atrophy. Low-attenuation changes in the deep white matter consistent with small vessel ischemia. Vascular: No hyperdense vessel or unexpected calcification. Skull: The skull is  intact. No fracture or focal lesion identified. Sinuses/Orbits: The visualized paranasal sinuses and mastoid air cells are clear. The orbits and globes intact. Other: None IMPRESSION: No acute intracranial pathology. Findings consistent with age related atrophy and  chronic small vessel ischemia Electronically Signed   By: Prudencio Pair M.D.   On: 02/25/2019 01:18    ____________________________________________  PROCEDURES   Procedure(s) performed (including Critical Care):  Procedures  ____________________________________________  INITIAL IMPRESSION / MDM / [place] / ED COURSE  As part of my medical decision making, I reviewed the following data within the electronic MEDICAL RECORD NUMBER Notes from prior ED visits and [place] Controlled Substance Database      *BRAELAN GIESER was evaluated in Emergency Department on 02/25/2019 for the symptoms described in the history of present illness. He was evaluated in the context of the global COVID-19 pandemic, which necessitated consideration that the patient might be at risk for infection with the SARS-CoV-2 virus that causes COVID-19. Institutional protocols and algorithms that pertain to the evaluation of patients at risk for COVID-19 are in a state of rapid change based on information released by regulatory bodies including the CDC and federal and state organizations. These policies and algorithms were followed during the patient's care in the ED.  Some ED evaluations and interventions may be delayed as a result of limited staffing during the pandemic.*      Medical Decision Making: 73 yo M here with lightheadedness upon standing.  This occurs in the setting of decreased p.o. intake and recent initiation of Lasix.  He actually appears somewhat clinically dry, and his leg edema has improved.  His symptoms correlate only with standing up, and are now completely resolved since he had a salty meal with a drink in the waiting room.  He states he now  feels completely back to his baseline and is requesting discharge.  He does have some mild reproduction of symptoms when standing quickly, but otherwise is well-appearing.  He is amatory without difficulty.  He is tolerating p.o.  His lab work is overall very reassuring.  His EKG is nonischemic.  He has baseline bradycardia that has not changed.  CT head is negative and he has no focal neurological findings including no signs of cerebellar abnormality or evidence to suggest stroke or central etiology.  His anemia is at baseline.  Renal function is at baseline.  I discussion with him as well as his sister, who helps care for him.  I discussed that he should temporarily hold his Lasix for 24 hours to allow for rehydration, then resume.  He will be very cautious when going from sitting to standing.  ____________________________________________  FINAL CLINICAL IMPRESSION(S) / ED DIAGNOSES  Final diagnoses:  Orthostasis  Dehydration     MEDICATIONS GIVEN DURING THIS VISIT:  Medications - No data to display   ED Discharge Orders    None       Note:  This document was prepared using Dragon voice recognition software and may include unintentional dictation errors.   Duffy Bruce, MD 02/25/19 [phone]

## 2019-02-25 NOTE — ED Notes (Signed)
Pt sitting in lobby, mask on, sleeping; awakens easily for vs; pt updated on wait time

## 2019-02-25 NOTE — Discharge Instructions (Signed)
Stop taking your furosemide for the next 24 hours.  Try to eat and drink a balanced diet tomorrow.  Resume your Lasix as prescribed after tomorrow.  Be very careful when going from sitting to standing.  Try to minimize salt intake.  You weighed 216 lb today. This seems to be an appropriate weight for you. Use this to monitor your fluid status and call your doctor if you gain more than 2 lb overnight.

## 2019-03-26 ENCOUNTER — Emergency Department: Payer: Medicare Other

## 2019-03-26 ENCOUNTER — Encounter: Payer: Self-pay | Admitting: Emergency Medicine

## 2019-03-26 ENCOUNTER — Other Ambulatory Visit: Payer: Self-pay

## 2019-03-26 ENCOUNTER — Inpatient Hospital Stay
Admission: EM | Admit: 2019-03-26 | Discharge: 2019-04-09 | DRG: 872 | Disposition: A | Discharge status: Skilled Nursing Facility | Payer: Medicare Other | Attending: Internal Medicine | Admitting: Internal Medicine

## 2019-03-26 DIAGNOSIS — D638 Anemia in other chronic diseases classified elsewhere: Secondary | ICD-10-CM | POA: Diagnosis present

## 2019-03-26 DIAGNOSIS — Z951 Presence of aortocoronary bypass graft: Secondary | ICD-10-CM

## 2019-03-26 DIAGNOSIS — Z886 Allergy status to analgesic agent status: Secondary | ICD-10-CM

## 2019-03-26 DIAGNOSIS — L89312 Pressure ulcer of right buttock, stage 2: Secondary | ICD-10-CM | POA: Diagnosis present

## 2019-03-26 DIAGNOSIS — Z66 Do not resuscitate: Secondary | ICD-10-CM | POA: Diagnosis not present

## 2019-03-26 DIAGNOSIS — D61818 Other pancytopenia: Secondary | ICD-10-CM

## 2019-03-26 DIAGNOSIS — Z20828 Contact with and (suspected) exposure to other viral communicable diseases: Secondary | ICD-10-CM | POA: Diagnosis present

## 2019-03-26 DIAGNOSIS — D6859 Other primary thrombophilia: Secondary | ICD-10-CM | POA: Diagnosis present

## 2019-03-26 DIAGNOSIS — A419 Sepsis, unspecified organism: Secondary | ICD-10-CM | POA: Diagnosis present

## 2019-03-26 DIAGNOSIS — M545 Low back pain: Secondary | ICD-10-CM | POA: Diagnosis present

## 2019-03-26 DIAGNOSIS — R509 Fever, unspecified: Secondary | ICD-10-CM | POA: Diagnosis not present

## 2019-03-26 DIAGNOSIS — Z79899 Other long term (current) drug therapy: Secondary | ICD-10-CM

## 2019-03-26 DIAGNOSIS — M109 Gout, unspecified: Secondary | ICD-10-CM | POA: Diagnosis present

## 2019-03-26 DIAGNOSIS — F329 Major depressive disorder, single episode, unspecified: Secondary | ICD-10-CM | POA: Diagnosis present

## 2019-03-26 DIAGNOSIS — Z803 Family history of malignant neoplasm of breast: Secondary | ICD-10-CM

## 2019-03-26 DIAGNOSIS — Z9079 Acquired absence of other genital organ(s): Secondary | ICD-10-CM

## 2019-03-26 DIAGNOSIS — R4182 Altered mental status, unspecified: Secondary | ICD-10-CM

## 2019-03-26 DIAGNOSIS — L89322 Pressure ulcer of left buttock, stage 2: Secondary | ICD-10-CM | POA: Diagnosis present

## 2019-03-26 DIAGNOSIS — N401 Enlarged prostate with lower urinary tract symptoms: Secondary | ICD-10-CM | POA: Diagnosis present

## 2019-03-26 DIAGNOSIS — R52 Pain, unspecified: Secondary | ICD-10-CM

## 2019-03-26 DIAGNOSIS — M533 Sacrococcygeal disorders, not elsewhere classified: Secondary | ICD-10-CM | POA: Diagnosis present

## 2019-03-26 DIAGNOSIS — C9200 Acute myeloblastic leukemia, not having achieved remission: Secondary | ICD-10-CM | POA: Diagnosis present

## 2019-03-26 DIAGNOSIS — E876 Hypokalemia: Secondary | ICD-10-CM | POA: Diagnosis present

## 2019-03-26 DIAGNOSIS — E538 Deficiency of other specified B group vitamins: Secondary | ICD-10-CM | POA: Diagnosis present

## 2019-03-26 DIAGNOSIS — Z825 Family history of asthma and other chronic lower respiratory diseases: Secondary | ICD-10-CM

## 2019-03-26 DIAGNOSIS — E785 Hyperlipidemia, unspecified: Secondary | ICD-10-CM | POA: Diagnosis present

## 2019-03-26 DIAGNOSIS — I5032 Chronic diastolic (congestive) heart failure: Secondary | ICD-10-CM | POA: Diagnosis present

## 2019-03-26 DIAGNOSIS — E7800 Pure hypercholesterolemia, unspecified: Secondary | ICD-10-CM | POA: Diagnosis present

## 2019-03-26 DIAGNOSIS — N179 Acute kidney failure, unspecified: Secondary | ICD-10-CM | POA: Diagnosis present

## 2019-03-26 DIAGNOSIS — Z8546 Personal history of malignant neoplasm of prostate: Secondary | ICD-10-CM

## 2019-03-26 DIAGNOSIS — K626 Ulcer of anus and rectum: Secondary | ICD-10-CM | POA: Diagnosis present

## 2019-03-26 DIAGNOSIS — I2510 Atherosclerotic heart disease of native coronary artery without angina pectoris: Secondary | ICD-10-CM | POA: Diagnosis present

## 2019-03-26 DIAGNOSIS — K219 Gastro-esophageal reflux disease without esophagitis: Secondary | ICD-10-CM | POA: Diagnosis present

## 2019-03-26 DIAGNOSIS — I340 Nonrheumatic mitral (valve) insufficiency: Secondary | ICD-10-CM | POA: Diagnosis present

## 2019-03-26 DIAGNOSIS — R338 Other retention of urine: Secondary | ICD-10-CM | POA: Diagnosis present

## 2019-03-26 DIAGNOSIS — L8990 Pressure ulcer of unspecified site, unspecified stage: Secondary | ICD-10-CM | POA: Insufficient documentation

## 2019-03-26 DIAGNOSIS — I82622 Acute embolism and thrombosis of deep veins of left upper extremity: Secondary | ICD-10-CM | POA: Diagnosis present

## 2019-03-26 DIAGNOSIS — W19XXXA Unspecified fall, initial encounter: Secondary | ICD-10-CM

## 2019-03-26 DIAGNOSIS — G40909 Epilepsy, unspecified, not intractable, without status epilepticus: Secondary | ICD-10-CM | POA: Diagnosis present

## 2019-03-26 DIAGNOSIS — K3189 Other diseases of stomach and duodenum: Secondary | ICD-10-CM

## 2019-03-26 DIAGNOSIS — L03115 Cellulitis of right lower limb: Secondary | ICD-10-CM | POA: Diagnosis present

## 2019-03-26 DIAGNOSIS — E1142 Type 2 diabetes mellitus with diabetic polyneuropathy: Secondary | ICD-10-CM | POA: Diagnosis present

## 2019-03-26 DIAGNOSIS — F419 Anxiety disorder, unspecified: Secondary | ICD-10-CM | POA: Diagnosis present

## 2019-03-26 DIAGNOSIS — D509 Iron deficiency anemia, unspecified: Secondary | ICD-10-CM | POA: Diagnosis present

## 2019-03-26 DIAGNOSIS — Z79891 Long term (current) use of opiate analgesic: Secondary | ICD-10-CM

## 2019-03-26 DIAGNOSIS — M5090 Cervical disc disorder, unspecified, unspecified cervical region: Secondary | ICD-10-CM | POA: Diagnosis present

## 2019-03-26 DIAGNOSIS — I110 Hypertensive heart disease with heart failure: Secondary | ICD-10-CM | POA: Diagnosis present

## 2019-03-26 DIAGNOSIS — M7989 Other specified soft tissue disorders: Secondary | ICD-10-CM

## 2019-03-26 LAB — COMPREHENSIVE METABOLIC PANEL
ALT: 14 U/L (ref 0–44)
AST: 20 U/L (ref 15–41)
Albumin: 4.5 g/dL (ref 3.5–5.0)
Alkaline Phosphatase: 116 U/L (ref 38–126)
Anion gap: 11 (ref 5–15)
BUN: 30 mg/dL — ABNORMAL HIGH (ref 8–23)
CO2: 27 mmol/L (ref 22–32)
Calcium: 9.5 mg/dL (ref 8.9–10.3)
Chloride: 100 mmol/L (ref 98–111)
Creatinine, Ser: 1.43 mg/dL — ABNORMAL HIGH (ref 0.61–1.24)
GFR calc Af Amer: 56 mL/min — ABNORMAL LOW (ref >60)
GFR calc non Af Amer: 48 mL/min — ABNORMAL LOW (ref >60)
Glucose, Bld: 197 mg/dL — ABNORMAL HIGH (ref 70–99)
Potassium: 4.4 mmol/L (ref 3.5–5.1)
Sodium: 138 mmol/L (ref 135–145)
Total Bilirubin: 0.9 mg/dL (ref 0.3–1.2)
Total Protein: 8.3 g/dL — ABNORMAL HIGH (ref 6.5–8.1)

## 2019-03-26 LAB — CBC WITH DIFFERENTIAL/PLATELET
Abs Immature Granulocytes: 0.17 10*3/uL — ABNORMAL HIGH (ref 0.00–0.07)
Basophils Absolute: 0 10*3/uL (ref 0.0–0.1)
Basophils Relative: 0 %
Eosinophils Absolute: 0.1 10*3/uL (ref 0.0–0.5)
Eosinophils Relative: 2 %
HCT: 25.5 % — ABNORMAL LOW (ref 39.0–52.0)
Hemoglobin: 7.9 g/dL — ABNORMAL LOW (ref 13.0–17.0)
Immature Granulocytes: 6 %
Lymphocytes Relative: 47 %
Lymphs Abs: 1.3 10*3/uL (ref 0.7–4.0)
MCH: 21.4 pg — ABNORMAL LOW (ref 26.0–34.0)
MCHC: 31 g/dL (ref 30.0–36.0)
MCV: 69.1 fL — ABNORMAL LOW (ref 80.0–100.0)
Monocytes Absolute: 0.5 10*3/uL (ref 0.1–1.0)
Monocytes Relative: 16 %
Neutro Abs: 0.8 10*3/uL — ABNORMAL LOW (ref 1.7–7.7)
Neutrophils Relative %: 29 %
Platelets: 148 10*3/uL — ABNORMAL LOW (ref 150–400)
RBC: 3.69 MIL/uL — ABNORMAL LOW (ref 4.22–5.81)
RDW: 19 % — ABNORMAL HIGH (ref 11.5–15.5)
Smear Review: ADEQUATE
WBC: 2.8 10*3/uL — ABNORMAL LOW (ref 4.0–10.5)
nRBC: 1.4 % — ABNORMAL HIGH (ref 0.0–0.2)

## 2019-03-26 LAB — URINALYSIS, COMPLETE (UACMP) WITH MICROSCOPIC
Bacteria, UA: NONE SEEN
Bilirubin Urine: NEGATIVE
Glucose, UA: >=500 mg/dL — AB
Hgb urine dipstick: NEGATIVE
Ketones, ur: NEGATIVE mg/dL
Leukocytes,Ua: NEGATIVE
Nitrite: NEGATIVE
Protein, ur: NEGATIVE mg/dL
Specific Gravity, Urine: 1.012 (ref 1.005–1.030)
Squamous Epithelial / HPF: NONE SEEN (ref 0–5)
pH: 7 (ref 5.0–8.0)

## 2019-03-26 LAB — SARS CORONAVIRUS 2 BY RT PCR (HOSPITAL ORDER, PERFORMED IN ~~LOC~~ HOSPITAL LAB): SARS Coronavirus 2: NEGATIVE

## 2019-03-26 LAB — LACTIC ACID, PLASMA: Lactic Acid, Venous: 2.2 mmol/L (ref 0.5–1.9)

## 2019-03-26 LAB — PROTIME-INR
INR: 1.1 (ref 0.8–1.2)
Prothrombin Time: 14.1 seconds (ref 11.4–15.2)

## 2019-03-26 MED ORDER — VANCOMYCIN HCL 10 G IV SOLR
2000.0000 mg | Freq: Once | INTRAVENOUS | Status: AC
  Administered 2019-03-26: 2000 mg via INTRAVENOUS
  Filled 2019-03-26: qty 2000

## 2019-03-26 MED ORDER — SODIUM CHLORIDE 0.9 % IV BOLUS
1000.0000 mL | Freq: Once | INTRAVENOUS | Status: AC
  Administered 2019-03-26: 1000 mL via INTRAVENOUS

## 2019-03-26 MED ORDER — SODIUM CHLORIDE 0.9 % IV SOLN
2.0000 g | Freq: Once | INTRAVENOUS | Status: AC
  Administered 2019-03-26: 2 g via INTRAVENOUS
  Filled 2019-03-26: qty 2

## 2019-03-26 MED ORDER — VANCOMYCIN HCL IN DEXTROSE 1-5 GM/200ML-% IV SOLN: 1000.0000 mg | Freq: Once | INTRAVENOUS | Status: DC

## 2019-03-26 MED ORDER — METRONIDAZOLE IN NACL 5-0.79 MG/ML-% IV SOLN
500.0000 mg | Freq: Once | INTRAVENOUS | Status: AC
  Administered 2019-03-26: 500 mg via INTRAVENOUS
  Filled 2019-03-26: qty 100

## 2019-03-26 NOTE — ED Notes (Signed)
Pt to room 13 via w/c, pt unable to stand unassisted, generalized weakness noted; pt is incont of urine; pt clensed, attends changed and assisted into gown; Dr Kerman Passey in to examine

## 2019-03-26 NOTE — ED Provider Notes (Signed)
[facility] Emergency Department Provider Note  Time seen: 8:40 PM  I have reviewed the triage vital signs and the nursing notes.   HISTORY  Chief Complaint Fall and Fever   HPI KIT DELUCA is a 73 y.o. male with a past medical history of anxiety, diabetes, hypertension, hyperlipidemia presents to the emergency department after a fall, found to be febrile and confused.   According to EMS report patient had an unwitnessed fall earlier this evening while at home, was found on his back.  Patient found to be febrile by EMS, he states he was unaware he had a fever.  Denies any recent vomiting or diarrhea.  States he has been coughing but this is been going on for years.  Patient does seem confused and only answers questions after multiple times asking him and his responses are somewhat slowed.  Past Medical History:  Diagnosis Date  . Anxiety   . Diabetes mellitus without complication ([place])   . Epilepsy ([place])   . High cholesterol   . Hypertension   . Prostate cancer [facility]) 2004   prostate removed    Patient Active Problem List   Diagnosis Date Noted  . AKI (acute kidney injury) ([place]) 07/24/2018  . 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 ([place]) 06/13/2018    Past Surgical History:  Procedure Laterality Date  . CARDIAC SURGERY    . IRRIGATION AND DEBRIDEMENT FOOT Right 06/14/2018   Procedure: IRRIGATION AND DEBRIDEMENT FOOT;  Surgeon: Sharlotte Alamo, DPM;  Location: [facility] ORS;  Service: Podiatry;  Laterality: Right;    Prior to Admission medications   Medication Sig Start Date End Date Taking? Authorizing Provider  acetaminophen-codeine (TYLENOL #3) 300-30 MG tablet Take 1 tablet by mouth every 8 (eight) hours as needed for moderate pain. 02/14/19   Gregor Hams, MD  acidophilus (RISAQUAD) CAPS capsule Take 1 capsule by mouth daily. 06/17/18   Gladstone Lighter, MD  ALPRAZolam Duanne Moron) 0.5 MG tablet Take 1 tablet (0.5 mg total) by mouth daily. Patient taking  differently: Take 1 mg by mouth at bedtime.  06/18/18   Gladstone Lighter, MD  ALPRAZolam Duanne Moron) 0.5 MG tablet Take 0.5 mg by mouth every morning.    [provider]  amLODipine (NORVASC) 10 MG tablet Take 1 tablet (10 mg total) by mouth daily. 06/18/18   Gladstone Lighter, MD  atorvastatin (LIPITOR) 20 MG tablet Take 20 mg by mouth daily.    [provider]  Cholecalciferol (VITAMIN D-1000 MAX ST) 25 MCG (1000 UT) tablet Take 1,000 Units by mouth daily. 08/30/12   [provider]  Cyanocobalamin (VITAMIN B-12 CR) 1000 MCG TBCR Take 1,000 mcg by mouth daily. 12/27/10   [provider]  gabapentin (NEURONTIN) 300 MG capsule Take 300 mg by mouth daily.    [provider]  gabapentin (NEURONTIN) 300 MG capsule Take 1,200 mg by mouth at bedtime.    [provider]  glimepiride (AMARYL) 2 MG tablet Take 2-6 mg by mouth 2 (two) times daily.  07/19/18   [provider]  HYDROcodone-acetaminophen (NORCO/VICODIN) 5-325 MG tablet Take 1-2 tablets by mouth every 6 (six) hours as needed for moderate pain or severe pain. Patient not taking: Reported on 07/24/2018 06/17/18   Gladstone Lighter, MD  hydrOXYzine (ATARAX/VISTARIL) 25 MG tablet Take 25-50 mg by mouth 2 (two) times daily. 01/05/15   [provider]  irbesartan (AVAPRO) 150 MG tablet Take 150 mg by mouth daily.    [provider]  metoprolol tartrate (LOPRESSOR)  50 MG tablet Take 50 mg by mouth 2 (two) times daily. 03/28/17   [provider]  nitroGLYCERIN (NITROSTAT) 0.4 MG SL tablet Take 0.4 mg by mouth as needed. 07/24/14   [provider]  Omega-3 1000 MG CAPS Take 1,000 mg by mouth daily. 12/23/10   [provider]  omeprazole (PRILOSEC) 20 MG capsule Take 20 mg by mouth 2 (two) times daily. 12/19/14   [provider]  PARoxetine (PAXIL) 20 MG tablet Take 1 tablet (20 mg total) by mouth daily. 06/18/18   Gladstone Lighter, MD   PHENobarbital (LUMINAL) 64.8 MG tablet Take 1 tablet (64.8 mg total) by mouth 3 (three) times daily. 06/17/18   Gladstone Lighter, MD  sulfamethoxazole-trimethoprim (BACTRIM DS,SEPTRA DS) 800-160 MG tablet Take 1 tablet by mouth 2 (two) times daily. 07/15/18   [provider]    Allergies  Allergen Reactions  . Naproxen Sodium     Other reaction(s): Other (See Comments) Upset stomach  . Nsaids     Other reaction(s): Unknown  . Aleve [Naproxen] Nausea And Vomiting  . Ibuprofen Nausea Only    Other reaction(s): Other (See Comments), Unknown Upset stomach     No family history on file.  Social History Social History   Tobacco Use  . Smoking status: Never Smoker  . Smokeless tobacco: Never Used  Substance Use Topics  . Alcohol use: No  . Drug use: No    Review of Systems Constitutional: Positive for fever, unknown to patient Cardiovascular: Negative for chest pain. Respiratory: Negative for shortness of breath.  Positive for cough times years per patient Gastrointestinal: Negative for abdominal pain, vomiting and diarrhea. Genitourinary: Negative for urinary compaints Musculoskeletal: Negative for musculoskeletal complaints Neurological: Negative for headache All other ROS negative, although possibly limited secondary to altered mental status.  ____________________________________________   PHYSICAL EXAM:  VITAL SIGNS: ED Triage Vitals  Enc Vitals Group     BP 03/26/19 1959 138/67     Pulse Rate 03/26/19 1959 (!) 132     Resp 03/26/19 1959 (!) 22     Temp 03/26/19 1959 (!) 102.4 F (39.1 C)     Temp Source 03/26/19 1959 Oral     SpO2 03/26/19 1959 95 %     Weight 03/26/19 2006 198 lb 6.6 oz (90 kg)     Height 03/26/19 2006 5\' 7"  (1.702 m)     Head Circumference --      Peak Flow --      Pain Score 03/26/19 2006 0     Pain Loc --      Pain Edu? --      Excl. in [place]? --    Constitutional: Patient is awake and alert.  Seems confused with slowed  responses. Eyes: Normal exam ENT      Head: Normocephalic and atraumatic.      Mouth/Throat: Mucous membranes are moist. Cardiovascular: Normal rate, regular rhythm.  Respiratory: Normal respiratory effort without tachypnea nor retractions. Breath sounds are clear  Gastrointestinal: Soft and nontender abdomen. Musculoskeletal: Nontender with normal range of motion in all extremities.  Neurologic: No slurred speech.  Overall normal language.  Able to move all extremities. Skin:  Skin is warm, dry and intact.  Psychiatric: Slowed responses, appears confused.  ____________________________________________    EKG  EKG viewed and interpreted by myself shows a normal sinus rhythm at 76 bpm with a narrow QRS, normal axis, normal intervals, nonspecific ST changes.  No ST elevation.  ____________________________________________    [id number]  Chest x-ray shows no acute disease.  ____________________________________________   INITIAL IMPRESSION / ASSESSMENT AND PLAN / ED COURSE  Pertinent labs & imaging results that were available during my care of the patient were reviewed by me and considered in my medical decision making (see chart for details).   Patient presents to the emergency department after a fall at home found to be febrile and somewhat confused in the emergency department.  Patient currently meets sepsis criteria with a temperature 102.4 pulse rate of 132 and a respiratory rate of 22.  Currently satting 95% on room air.  We will check labs, cultures, chest x-ray given the fall we will obtain CT imaging of the head as well.  We will also obtain a rapid COVID swab.  Patient's work-up is largely reassuring besides a mildly elevated lactate.  Patient is febrile and tachycardic and tachypneic upon arrival receiving broad-spectrum antibiotics under sepsis protocols.  Patient's urinalysis is clear, chest x-ray is clear, COVID is negative.  However given the presentation fall and  confusion we will admit to the hospital service for ongoing treatment till his cultures grow out.  NEVIL ANGER was evaluated in Emergency Department on 03/26/2019 for the symptoms described in the history of present illness. He was evaluated in the context of the global COVID-19 pandemic, which necessitated consideration that the patient might be at risk for infection with the SARS-CoV-2 virus that causes COVID-19. Institutional protocols and algorithms that pertain to the evaluation of patients at risk for COVID-19 are in a state of rapid change based on information released by regulatory bodies including the CDC and federal and state organizations. These policies and algorithms were followed during the patient's care in the ED.   CRITICAL CARE Performed by: [facility]   Total critical care time: 30 minutes  Critical care time was exclusive of separately billable procedures and treating other patients.  Critical care was necessary to treat or prevent imminent or life-threatening deterioration.  Critical care was time spent personally by me on the following activities: development of treatment plan with patient and/or surrogate as well as nursing, discussions with consultants, evaluation of patient's response to treatment, examination of patient, obtaining history from patient or surrogate, ordering and performing treatments and interventions, ordering and review of laboratory studies, ordering and review of radiographic studies, pulse oximetry and re-evaluation of patient's condition.  ____________________________________________   FINAL CLINICAL IMPRESSION(S) / ED DIAGNOSES  Sepsis Fever Altered mental status Cheral Marker, MD 03/26/19 2216

## 2019-03-26 NOTE — ED Notes (Signed)
ED TO INPATIENT HANDOFF REPORT  ED Nurse Name and Phone #: Karena Addison [phone]  S Name/Age/Gender Mike [name] 73 y.o. male Room/Bed: ED13A/ED13A  Code Status   Code Status: Prior  Home/SNF/Other Home Patient oriented to: self and place Is this baseline? Yes   Triage Complete: Triage complete  Chief Complaint EMS; Fall  Triage Note Pt presents to ED via EMS after an unwitnessed fall earlier this evening. Pt states he was walking in his home and fell landing on his back. pt he states he does not remember what happened. Denies pain or bruising. Pt reports he did not know he had a fever and denies feeling ill recently. Pt has slight increased work of breathing noted during triage. +cough "for the past 20 years". Denies dizziness or chest pain.   Allergies Allergies  Allergen Reactions  . Naproxen Sodium     Other reaction(s): Other (See Comments) Upset stomach  . Nsaids     Other reaction(s): Unknown  . Aleve [Naproxen] Nausea And Vomiting  . Ibuprofen Nausea Only    Other reaction(s): Other (See Comments), Unknown Upset stomach     Level of Care/Admitting Diagnosis ED Disposition    ED Disposition Condition Comment   Admit  The patient appears reasonably stabilized for admission considering the current resources, flow, and capabilities available in the ED at this time, and I doubt any other [facility] requiring further screening and/or treatment in the ED prior to admission is  present.       B Medical/Surgery History Past Medical History:  Diagnosis Date  . Anxiety   . Diabetes mellitus without complication ([place])   . Epilepsy ([place])   . High cholesterol   . Hypertension   . Prostate cancer [facility]) 2004   prostate removed   Past Surgical History:  Procedure Laterality Date  . CARDIAC SURGERY    . IRRIGATION AND DEBRIDEMENT FOOT Right 06/14/2018   Procedure: IRRIGATION AND DEBRIDEMENT FOOT;  Surgeon: Sharlotte Alamo, DPM;  Location: [facility] ORS;  Service: Podiatry;  Laterality:  Right;     A IV Location/Drains/Wounds Patient Lines/Drains/Airways Status   Active Line/Drains/Airways    Name:   Placement date:   Placement time:   Site:   Days:   Peripheral IV 03/26/19 Left Antecubital   03/26/19    2057    Antecubital   less than 1   Incision (Closed) 06/14/18 Foot Right   06/14/18    0828     285   Wound / Incision (Open or Dehisced) 06/13/18 Diabetic ulcer Foot Right   06/13/18    1200    Foot   286          Intake/Output Last 24 hours No intake or output data in the 24 hours ending 03/26/19 2217  Labs/Imaging Results for orders placed or performed during the hospital encounter of 03/26/19 (from the past 48 hour(s))  Comprehensive metabolic panel     Status: Abnormal   Collection Time: 03/26/19  8:12 PM  Result Value Ref Range   Sodium 138 135 - 145 mmol/L   Potassium 4.4 3.5 - 5.1 mmol/L   Chloride 100 98 - 111 mmol/L   CO2 27 22 - 32 mmol/L   Glucose, Bld 197 (H) 70 - 99 mg/dL   BUN 30 (H) 8 - 23 mg/dL   Creatinine, Ser 1.43 (H) 0.61 - 1.24 mg/dL   Calcium 9.5 8.9 - 10.3 mg/dL   Total Protein 8.3 (H) 6.5 - 8.1 g/dL   Albumin  4.5 3.5 - 5.0 g/dL   AST 20 15 - 41 U/L   ALT 14 0 - 44 U/L   Alkaline Phosphatase 116 38 - 126 U/L   Total Bilirubin 0.9 0.3 - 1.2 mg/dL   GFR calc non Af Amer 48 (L) >60 mL/min   GFR calc Af Amer 56 (L) >60 mL/min   Anion gap 11 5 - 15    Comment: Performed at [facility], [address] [place], [place] [address]  Lactic acid, plasma     Status: Abnormal   Collection Time: 03/26/19  8:12 PM  Result Value Ref Range   Lactic Acid, Venous 2.2 (HH) 0.5 - 1.9 mmol/L    Comment: CRITICAL RESULT CALLED TO, READ BACK BY AND VERIFIED WITH DEE Vallie Fayette RN AT 2046 ON 03/26/2019 SNG Performed at [facility], [address] [place], [place] [address]   CBC with Differential     Status: Abnormal   Collection Time: 03/26/19  8:12 PM  Result Value Ref Range   WBC 2.8 (L) 4.0 - 10.5 K/uL   RBC 3.69 (L) 4.22  - 5.81 MIL/uL   Hemoglobin 7.9 (L) 13.0 - 17.0 g/dL    Comment: Reticulocyte Hemoglobin testing may be clinically indicated, consider ordering this additional test [id number]    HCT 25.5 (L) 39.0 - 52.0 %   MCV 69.1 (L) 80.0 - 100.0 fL   MCH 21.4 (L) 26.0 - 34.0 pg   MCHC 31.0 30.0 - 36.0 g/dL   RDW 19.0 (H) 11.5 - 15.5 %   Platelets 148 (L) 150 - 400 K/uL   nRBC 1.4 (H) 0.0 - 0.2 %   Neutrophils Relative % 29 %   Neutro Abs 0.8 (L) 1.7 - 7.7 K/uL   Lymphocytes Relative 47 %   Lymphs Abs 1.3 0.7 - 4.0 K/uL   Monocytes Relative 16 %   Monocytes Absolute 0.5 0.1 - 1.0 K/uL   Eosinophils Relative 2 %   Eosinophils Absolute 0.1 0.0 - 0.5 K/uL   Basophils Relative 0 %   Basophils Absolute 0.0 0.0 - 0.1 K/uL   WBC Morphology MORPHOLOGY UNREMARKABLE    Smear Review PLATELETS APPEAR ADEQUATE    Immature Granulocytes 6 %   Abs Immature Granulocytes 0.17 (H) 0.00 - 0.07 K/uL   Tear Drop Cells PRESENT    Polychromasia PRESENT     Comment: Performed at [facility], [address] [place], [place] [address]  Protime-INR     Status: None   Collection Time: 03/26/19  8:12 PM  Result Value Ref Range   Prothrombin Time 14.1 11.4 - 15.2 seconds   INR 1.1 0.8 - 1.2    Comment: (NOTE) INR goal varies based on device and disease states. Performed at [facility], [address] [place],  [address]   SARS Coronavirus 2 [facility] order, Performed in [facility] hospital lab) Nasopharyngeal Nasopharyngeal Swab     Status: None   Collection Time: 03/26/19  8:50 PM   Specimen: Nasopharyngeal Swab  Result Value Ref Range   SARS Coronavirus 2 NEGATIVE NEGATIVE    Comment: (NOTE) If result is NEGATIVE SARS-CoV-2 target nucleic acids are NOT DETECTED. The SARS-CoV-2 RNA is generally detectable in upper and lower  respiratory specimens during the acute phase of infection. The lowest  concentration of SARS-CoV-2 viral copies this assay can detect is 250  copies / mL. A  negative result does not preclude SARS-CoV-2 infection  and should not be used as  the sole basis for treatment or other  patient management decisions.  A negative result may occur with  improper specimen collection / handling, submission of specimen other  than nasopharyngeal swab, presence of viral mutation(s) within the  areas targeted by this assay, and inadequate number of viral copies  (<250 copies / mL). A negative result must be combined with clinical  observations, patient history, and epidemiological information. If result is POSITIVE SARS-CoV-2 target nucleic acids are DETECTED. The SARS-CoV-2 RNA is generally detectable in upper and lower  respiratory specimens dur ing the acute phase of infection.  Positive  results are indicative of active infection with SARS-CoV-2.  Clinical  correlation with patient history and other diagnostic information is  necessary to determine patient infection status.  Positive results do  not rule out bacterial infection or co-infection with other viruses. If result is PRESUMPTIVE POSTIVE SARS-CoV-2 nucleic acids MAY BE PRESENT.   A presumptive positive result was obtained on the submitted specimen  and confirmed on repeat testing.  While 2019 novel coronavirus  (SARS-CoV-2) nucleic acids may be present in the submitted sample  additional confirmatory testing may be necessary for epidemiological  and / or clinical management purposes  to differentiate between  SARS-CoV-2 and other Sarbecovirus currently known to infect humans.  If clinically indicated additional testing with an alternate test  methodology [phone]) is advised. The SARS-CoV-2 RNA is generally  detectable in upper and lower respiratory sp ecimens during the acute  phase of infection. The expected result is Negative. Fact Sheet for Patients:  StrictlyIdeas.no Fact Sheet for Healthcare Providers: BankingDealers.co.za This test is not  yet approved or cleared by the [place] FDA and has been authorized for detection and/or diagnosis of SARS-CoV-2 by FDA under an Emergency Use Authorization (EUA).  This EUA will remain in effect (meaning this test can be used) for the duration of the COVID-19 declaration under Section 564(b)(1) of the Act, 21 U.S.C. section 360bbb-3(b)(1), unless the authorization is terminated or revoked sooner. Performed at [facility], [address] [place], [place] [address]   Urinalysis, Complete w Microscopic     Status: Abnormal   Collection Time: 03/26/19  8:51 PM  Result Value Ref Range   Color, Urine YELLOW (A) YELLOW   APPearance CLEAR (A) CLEAR   Specific Gravity, Urine 1.012 1.005 - 1.030   pH 7.0 5.0 - 8.0   Glucose, UA >=500 (A) NEGATIVE mg/dL   Hgb urine dipstick NEGATIVE NEGATIVE   Bilirubin Urine NEGATIVE NEGATIVE   Ketones, ur NEGATIVE NEGATIVE mg/dL   Protein, ur NEGATIVE NEGATIVE mg/dL   Nitrite NEGATIVE NEGATIVE   Leukocytes,Ua NEGATIVE NEGATIVE   WBC, UA 0-5 0 - 5 WBC/hpf   Bacteria, UA NONE SEEN NONE SEEN   Squamous Epithelial / LPF NONE SEEN 0 - 5    Comment: Performed at [facility], [address] [place], [place] [address]   Dg Chest 1 View  Result Date: 03/26/2019 CLINICAL DATA:  UNWITNESSED FALL EXAM: CHEST  1 VIEW COMPARISON:  January 07, 2018 FINDINGS: There is mild cardiomegaly. Overlying median sternotomy wires. Both lungs are clear. 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 IMPRESSION: No acute cardiopulmonary process. Electronically Signed   By: Prudencio Pair M.D.   On: 03/26/2019 21:03    Pending Labs Unresulted Labs (From admission, onward)    Start     Ordered   03/26/19 2044  Urine culture  ONCE - STAT,   STAT     03/26/19 2044  03/26/19 2012  Pathologist smear review  Once,   STAT     03/26/19 2012   03/26/19 2009  Lactic acid, plasma  Now then every 2 hours,   STAT     03/26/19 2008   03/26/19 2009  Culture, blood (Routine x  2)  BLOOD CULTURE X 2,   STAT     03/26/19 2008          Vitals/Pain Today's Vitals   03/26/19 1959 03/26/19 2006 03/26/19 2130 03/26/19 2200  BP: 138/67  138/66 (!) 156/74  Pulse: (!) 132   83  Resp: (!) 22  (!) 25 (!) 24  Temp: (!) 102.4 F (39.1 C)     TempSrc: Oral     SpO2: 95%   100%  Weight:  90 kg    Height:  5\' 7"  (1.702 m)    PainSc:  0-No pain      Isolation Precautions Airborne and Contact precautions  Medications Medications  metroNIDAZOLE (FLAGYL) IVPB 500 mg (500 mg Intravenous New Bag/Given 03/26/19 2125)  vancomycin (VANCOCIN) 2,000 mg in sodium chloride 0.9 % 500 mL IVPB (has no administration in time range)  ceFEPIme (MAXIPIME) 2 g in sodium chloride 0.9 % 100 mL IVPB (0 g Intravenous Stopped 03/26/19 2125)  sodium chloride 0.9 % bolus 1,000 mL (0 mLs Intravenous Stopped 03/26/19 2201)    Mobility manual wheelchair High fall risk   Focused Assessments    R Recommendations: See Admitting Provider Note  Report given to:
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
## 2019-03-26 NOTE — ED Triage Notes (Addendum)
Pt presents to ED via EMS after an unwitnessed fall earlier this evening. Pt states he was walking in his home and fell landing on his back. pt he states he does not remember what happened. Denies pain or bruising. Pt reports he did not know he had a fever and denies feeling ill recently. Pt has slight increased work of breathing noted during triage. +cough "for the past 20 years". Denies dizziness or chest pain.

## 2019-03-26 NOTE — Progress Notes (Signed)
CODE SEPSIS - PHARMACY COMMUNICATION  **Broad Spectrum Antibiotics should be administered within 1 hour of Sepsis diagnosis**  Time Code Sepsis Called/Page Received:  10/7 @ 2044  Antibiotics Ordered: Vancomycin , Cefepime   Time of 1st antibiotic administration: 10/7 @ 2057   Additional action taken by pharmacy:   If necessary, Name of Provider/Nurse Contacted:     Tumeka Chimenti D ,PharmD Clinical Pharmacist  03/26/2019  9:43 PM

## 2019-03-26 NOTE — Progress Notes (Signed)
PHARMACY -  BRIEF ANTIBIOTIC NOTE   Pharmacy has received consult(s) for Vancomycin, Cefepime from an ED provider.  The patient's profile has been reviewed for ht/wt/allergies/indication/available labs.    One time order(s) placed for Vancomycin 2 gm IV X 1 and Cefepime 2 gm IV X 1.   Further antibiotics/pharmacy consults should be ordered by admitting physician if indicated.                       Thank you, Raniah Karan D 03/26/2019  8:58 PM

## 2019-03-27 DIAGNOSIS — D638 Anemia in other chronic diseases classified elsewhere: Secondary | ICD-10-CM | POA: Diagnosis present

## 2019-03-27 DIAGNOSIS — I82622 Acute embolism and thrombosis of deep veins of left upper extremity: Secondary | ICD-10-CM | POA: Diagnosis present

## 2019-03-27 DIAGNOSIS — W19XXXA Unspecified fall, initial encounter: Secondary | ICD-10-CM | POA: Diagnosis not present

## 2019-03-27 DIAGNOSIS — R338 Other retention of urine: Secondary | ICD-10-CM | POA: Diagnosis present

## 2019-03-27 DIAGNOSIS — A419 Sepsis, unspecified organism: Secondary | ICD-10-CM | POA: Diagnosis present

## 2019-03-27 DIAGNOSIS — Z8546 Personal history of malignant neoplasm of prostate: Secondary | ICD-10-CM

## 2019-03-27 DIAGNOSIS — R4182 Altered mental status, unspecified: Secondary | ICD-10-CM | POA: Diagnosis not present

## 2019-03-27 DIAGNOSIS — D72819 Decreased white blood cell count, unspecified: Secondary | ICD-10-CM | POA: Diagnosis not present

## 2019-03-27 DIAGNOSIS — D6859 Other primary thrombophilia: Secondary | ICD-10-CM | POA: Diagnosis present

## 2019-03-27 DIAGNOSIS — D61818 Other pancytopenia: Secondary | ICD-10-CM

## 2019-03-27 DIAGNOSIS — I110 Hypertensive heart disease with heart failure: Secondary | ICD-10-CM | POA: Diagnosis present

## 2019-03-27 DIAGNOSIS — D509 Iron deficiency anemia, unspecified: Secondary | ICD-10-CM | POA: Diagnosis present

## 2019-03-27 DIAGNOSIS — L03115 Cellulitis of right lower limb: Secondary | ICD-10-CM | POA: Diagnosis present

## 2019-03-27 DIAGNOSIS — R339 Retention of urine, unspecified: Secondary | ICD-10-CM | POA: Diagnosis not present

## 2019-03-27 DIAGNOSIS — C9200 Acute myeloblastic leukemia, not having achieved remission: Secondary | ICD-10-CM | POA: Diagnosis not present

## 2019-03-27 DIAGNOSIS — F419 Anxiety disorder, unspecified: Secondary | ICD-10-CM | POA: Diagnosis present

## 2019-03-27 DIAGNOSIS — K626 Ulcer of anus and rectum: Secondary | ICD-10-CM | POA: Diagnosis present

## 2019-03-27 DIAGNOSIS — E538 Deficiency of other specified B group vitamins: Secondary | ICD-10-CM

## 2019-03-27 DIAGNOSIS — R509 Fever, unspecified: Secondary | ICD-10-CM | POA: Diagnosis present

## 2019-03-27 DIAGNOSIS — G40909 Epilepsy, unspecified, not intractable, without status epilepticus: Secondary | ICD-10-CM | POA: Diagnosis not present

## 2019-03-27 DIAGNOSIS — N179 Acute kidney failure, unspecified: Secondary | ICD-10-CM | POA: Diagnosis present

## 2019-03-27 DIAGNOSIS — Z9181 History of falling: Secondary | ICD-10-CM | POA: Diagnosis not present

## 2019-03-27 DIAGNOSIS — I5032 Chronic diastolic (congestive) heart failure: Secondary | ICD-10-CM | POA: Diagnosis present

## 2019-03-27 DIAGNOSIS — R410 Disorientation, unspecified: Secondary | ICD-10-CM | POA: Diagnosis not present

## 2019-03-27 DIAGNOSIS — R569 Unspecified convulsions: Secondary | ICD-10-CM | POA: Diagnosis not present

## 2019-03-27 DIAGNOSIS — N401 Enlarged prostate with lower urinary tract symptoms: Secondary | ICD-10-CM | POA: Diagnosis present

## 2019-03-27 DIAGNOSIS — K6289 Other specified diseases of anus and rectum: Secondary | ICD-10-CM | POA: Diagnosis not present

## 2019-03-27 DIAGNOSIS — K219 Gastro-esophageal reflux disease without esophagitis: Secondary | ICD-10-CM | POA: Diagnosis present

## 2019-03-27 DIAGNOSIS — Z20828 Contact with and (suspected) exposure to other viral communicable diseases: Secondary | ICD-10-CM | POA: Diagnosis present

## 2019-03-27 DIAGNOSIS — D696 Thrombocytopenia, unspecified: Secondary | ICD-10-CM | POA: Diagnosis not present

## 2019-03-27 DIAGNOSIS — I340 Nonrheumatic mitral (valve) insufficiency: Secondary | ICD-10-CM | POA: Diagnosis present

## 2019-03-27 DIAGNOSIS — I2510 Atherosclerotic heart disease of native coronary artery without angina pectoris: Secondary | ICD-10-CM | POA: Diagnosis present

## 2019-03-27 DIAGNOSIS — R5081 Fever presenting with conditions classified elsewhere: Secondary | ICD-10-CM | POA: Diagnosis not present

## 2019-03-27 DIAGNOSIS — Z66 Do not resuscitate: Secondary | ICD-10-CM | POA: Diagnosis not present

## 2019-03-27 DIAGNOSIS — E119 Type 2 diabetes mellitus without complications: Secondary | ICD-10-CM

## 2019-03-27 DIAGNOSIS — E1142 Type 2 diabetes mellitus with diabetic polyneuropathy: Secondary | ICD-10-CM | POA: Diagnosis present

## 2019-03-27 DIAGNOSIS — E785 Hyperlipidemia, unspecified: Secondary | ICD-10-CM | POA: Diagnosis present

## 2019-03-27 DIAGNOSIS — D709 Neutropenia, unspecified: Secondary | ICD-10-CM | POA: Diagnosis not present

## 2019-03-27 DIAGNOSIS — M545 Low back pain: Secondary | ICD-10-CM | POA: Diagnosis present

## 2019-03-27 DIAGNOSIS — D649 Anemia, unspecified: Secondary | ICD-10-CM | POA: Diagnosis not present

## 2019-03-27 LAB — HEMOGLOBIN A1C
Hgb A1c MFr Bld: 9.1 % — ABNORMAL HIGH (ref 4.8–5.6)
Mean Plasma Glucose: 214.47 mg/dL

## 2019-03-27 LAB — GLUCOSE, CAPILLARY
Glucose-Capillary: 287 mg/dL — ABNORMAL HIGH (ref 70–99)
Glucose-Capillary: 234 mg/dL — ABNORMAL HIGH (ref 70–99)
Glucose-Capillary: 152 mg/dL — ABNORMAL HIGH (ref 70–99)
Glucose-Capillary: 152 mg/dL — ABNORMAL HIGH (ref 70–99)

## 2019-03-27 LAB — BASIC METABOLIC PANEL
Anion gap: 11 (ref 5–15)
BUN: 25 mg/dL — ABNORMAL HIGH (ref 8–23)
CO2: 24 mmol/L (ref 22–32)
Calcium: 8.6 mg/dL — ABNORMAL LOW (ref 8.9–10.3)
Chloride: 105 mmol/L (ref 98–111)
Creatinine, Ser: 1.3 mg/dL — ABNORMAL HIGH (ref 0.61–1.24)
GFR calc Af Amer: >60 mL/min (ref >60)
GFR calc non Af Amer: 54 mL/min — ABNORMAL LOW (ref >60)
Glucose, Bld: 187 mg/dL — ABNORMAL HIGH (ref 70–99)
Potassium: 4.7 mmol/L (ref 3.5–5.1)
Sodium: 140 mmol/L (ref 135–145)

## 2019-03-27 LAB — URINE CULTURE: Culture: NO GROWTH

## 2019-03-27 LAB — FERRITIN: Ferritin: 425 ng/mL — ABNORMAL HIGH (ref 24–336)

## 2019-03-27 LAB — CBC
HCT: 21.7 % — ABNORMAL LOW (ref 39.0–52.0)
Hemoglobin: 6.6 g/dL — ABNORMAL LOW (ref 13.0–17.0)
MCH: 21.2 pg — ABNORMAL LOW (ref 26.0–34.0)
MCHC: 30.4 g/dL (ref 30.0–36.0)
MCV: 69.8 fL — ABNORMAL LOW (ref 80.0–100.0)
Platelets: 118 10*3/uL — ABNORMAL LOW (ref 150–400)
RBC: 3.11 MIL/uL — ABNORMAL LOW (ref 4.22–5.81)
RDW: 19 % — ABNORMAL HIGH (ref 11.5–15.5)
WBC: 2.9 10*3/uL — ABNORMAL LOW (ref 4.0–10.5)
nRBC: 1.4 % — ABNORMAL HIGH (ref 0.0–0.2)

## 2019-03-27 LAB — OCCULT BLOOD X 1 CARD TO LAB, STOOL: Fecal Occult Bld: NEGATIVE

## 2019-03-27 LAB — IRON AND TIBC
Iron: 23 ug/dL — ABNORMAL LOW (ref 45–182)
Saturation Ratios: 9 % — ABNORMAL LOW (ref 17.9–39.5)
TIBC: 255 ug/dL (ref 250–450)
UIBC: 232 ug/dL

## 2019-03-27 LAB — BRAIN NATRIURETIC PEPTIDE: B Natriuretic Peptide: 125 pg/mL — ABNORMAL HIGH (ref 0.0–100.0)

## 2019-03-27 LAB — VITAMIN B12: Vitamin B-12: 2930 pg/mL — ABNORMAL HIGH (ref 180–914)

## 2019-03-27 LAB — LACTIC ACID, PLASMA: Lactic Acid, Venous: 2.3 mmol/L (ref 0.5–1.9)

## 2019-03-27 LAB — PATHOLOGIST SMEAR REVIEW

## 2019-03-27 LAB — FOLATE: Folate: 30 ng/mL (ref >5.9)

## 2019-03-27 LAB — PROCALCITONIN: Procalcitonin: <0.1 ng/mL

## 2019-03-27 LAB — HEMOGLOBIN AND HEMATOCRIT, BLOOD
HCT: 25.7 % — ABNORMAL LOW (ref 39.0–52.0)
Hemoglobin: 8.1 g/dL — ABNORMAL LOW (ref 13.0–17.0)

## 2019-03-27 LAB — ABO/RH: ABO/RH(D): O POS

## 2019-03-27 LAB — MRSA PCR SCREENING: MRSA by PCR: NEGATIVE

## 2019-03-27 LAB — PREPARE RBC (CROSSMATCH)

## 2019-03-27 LAB — PHENOBARBITAL LEVEL: Phenobarbital: 24.5 ug/mL (ref 15.0–30.0)

## 2019-03-27 LAB — VANCOMYCIN, RANDOM: Vancomycin Rm: 22

## 2019-03-27 MED ORDER — VITAMIN D 25 MCG (1000 UNIT) PO TABS
1000.0000 [IU] | ORAL_TABLET | Freq: Every day | ORAL | Status: DC
  Administered 2019-03-27 – 2019-04-09 (×14): 1000 [IU] via ORAL
  Filled 2019-03-27 (×14): qty 1

## 2019-03-27 MED ORDER — INSULIN ASPART 100 UNIT/ML ~~LOC~~ SOLN
0.0000 [IU] | Freq: Every day | SUBCUTANEOUS | Status: DC
  Administered 2019-03-28 – 2019-03-30 (×2): 2 [IU] via SUBCUTANEOUS
  Administered 2019-03-31: 22:00:00 3 [IU] via SUBCUTANEOUS
  Administered 2019-04-01: 2 [IU] via SUBCUTANEOUS
  Administered 2019-04-02: 21:00:00 3 [IU] via SUBCUTANEOUS
  Administered 2019-04-03: 4 [IU] via SUBCUTANEOUS
  Administered 2019-04-04: 3 [IU] via SUBCUTANEOUS
  Administered 2019-04-05: 5 [IU] via SUBCUTANEOUS
  Administered 2019-04-06: 22:00:00 4 [IU] via SUBCUTANEOUS
  Administered 2019-04-07: 2 [IU] via SUBCUTANEOUS
  Filled 2019-03-27 (×11): qty 1

## 2019-03-27 MED ORDER — SODIUM CHLORIDE 0.9 % IV SOLN
2.0000 g | Freq: Two times a day (BID) | INTRAVENOUS | Status: DC
  Administered 2019-03-27 – 2019-03-29 (×5): 2 g via INTRAVENOUS
  Filled 2019-03-27 (×7): qty 2

## 2019-03-27 MED ORDER — FUROSEMIDE 40 MG PO TABS
40.0000 mg | ORAL_TABLET | Freq: Every day | ORAL | Status: DC
  Administered 2019-03-27 – 2019-04-09 (×14): 40 mg via ORAL
  Filled 2019-03-27 (×14): qty 1

## 2019-03-27 MED ORDER — ACETAMINOPHEN-CODEINE #3 300-30 MG PO TABS
1.0000 | ORAL_TABLET | Freq: Three times a day (TID) | ORAL | Status: DC | PRN
  Administered 2019-04-06 – 2019-04-08 (×5): 1 via ORAL
  Filled 2019-03-27 (×9): qty 1

## 2019-03-27 MED ORDER — PAROXETINE HCL 20 MG PO TABS
20.0000 mg | ORAL_TABLET | Freq: Every day | ORAL | Status: DC
  Administered 2019-03-27 – 2019-04-09 (×14): 20 mg via ORAL
  Filled 2019-03-27 (×14): qty 1

## 2019-03-27 MED ORDER — PHENOBARBITAL 97.2 MG PO TABS
194.4000 mg | ORAL_TABLET | Freq: Every day | ORAL | Status: DC
  Administered 2019-03-27 – 2019-03-31 (×5): 194.4 mg via ORAL
  Filled 2019-03-27 (×5): qty 2

## 2019-03-27 MED ORDER — GABAPENTIN 300 MG PO CAPS: 300.0000 mg | ORAL_CAPSULE | Freq: Every day | ORAL | Status: DC

## 2019-03-27 MED ORDER — ACETAMINOPHEN 325 MG PO TABS
650.0000 mg | ORAL_TABLET | Freq: Four times a day (QID) | ORAL | Status: DC | PRN
  Administered 2019-03-27 – 2019-04-07 (×12): 650 mg via ORAL
  Filled 2019-03-27 (×10): qty 2

## 2019-03-27 MED ORDER — ATORVASTATIN CALCIUM 20 MG PO TABS
20.0000 mg | ORAL_TABLET | Freq: Every day | ORAL | Status: DC
  Administered 2019-03-27 – 2019-04-08 (×12): 20 mg via ORAL
  Filled 2019-03-27 (×12): qty 1

## 2019-03-27 MED ORDER — SODIUM CHLORIDE 0.9% IV SOLUTION
Freq: Once | INTRAVENOUS | Status: AC
  Administered 2019-03-27: 10:00:00 via INTRAVENOUS

## 2019-03-27 MED ORDER — SODIUM CHLORIDE 0.9 % IV SOLN
INTRAVENOUS | Status: DC
  Administered 2019-03-27 – 2019-04-01 (×8): via INTRAVENOUS

## 2019-03-27 MED ORDER — SODIUM CHLORIDE 0.9% FLUSH
10.0000 mL | INTRAVENOUS | Status: DC | PRN
  Administered 2019-04-05 – 2019-04-06 (×3): 10 mL
  Filled 2019-03-27 (×3): qty 40

## 2019-03-27 MED ORDER — ASPIRIN EC 81 MG PO TBEC
81.0000 mg | DELAYED_RELEASE_TABLET | Freq: Every day | ORAL | Status: DC
  Administered 2019-03-27 – 2019-04-09 (×14): 81 mg via ORAL
  Filled 2019-03-27 (×14): qty 1

## 2019-03-27 MED ORDER — VANCOMYCIN HCL IN DEXTROSE 1-5 GM/200ML-% IV SOLN: 1000.0000 mg | Freq: Once | INTRAVENOUS | Status: DC

## 2019-03-27 MED ORDER — VITAMIN B-12 1000 MCG PO TABS
2000.0000 ug | ORAL_TABLET | Freq: Every day | ORAL | Status: DC
  Administered 2019-03-27 – 2019-04-09 (×14): 2000 ug via ORAL
  Filled 2019-03-27 (×14): qty 2

## 2019-03-27 MED ORDER — METHYLPREDNISOLONE SODIUM SUCC 40 MG IJ SOLR
40.0000 mg | Freq: Once | INTRAMUSCULAR | Status: AC
  Administered 2019-03-27: 03:00:00 40 mg via INTRAVENOUS
  Filled 2019-03-27: qty 1

## 2019-03-27 MED ORDER — PANTOPRAZOLE SODIUM 40 MG PO TBEC
40.0000 mg | DELAYED_RELEASE_TABLET | Freq: Every day | ORAL | Status: DC
  Administered 2019-03-27 – 2019-04-09 (×14): 40 mg via ORAL
  Filled 2019-03-27 (×14): qty 1

## 2019-03-27 MED ORDER — POTASSIUM CHLORIDE CRYS ER 10 MEQ PO TBCR: 10.0000 meq | EXTENDED_RELEASE_TABLET | Freq: Every day | ORAL | Status: DC | PRN

## 2019-03-27 MED ORDER — GABAPENTIN 300 MG PO CAPS
300.0000 mg | ORAL_CAPSULE | Freq: Every day | ORAL | Status: DC
  Administered 2019-03-27 – 2019-04-09 (×14): 300 mg via ORAL
  Filled 2019-03-27 (×13): qty 1

## 2019-03-27 MED ORDER — IPRATROPIUM-ALBUTEROL 0.5-2.5 (3) MG/3ML IN SOLN
3.0000 mL | Freq: Four times a day (QID) | RESPIRATORY_TRACT | Status: DC
  Administered 2019-03-27 – 2019-03-28 (×4): 3 mL via RESPIRATORY_TRACT
  Filled 2019-03-27 (×7): qty 3

## 2019-03-27 MED ORDER — OMEGA-3-ACID ETHYL ESTERS 1 G PO CAPS
1000.0000 mg | ORAL_CAPSULE | Freq: Every day | ORAL | Status: DC
  Administered 2019-03-27 – 2019-04-09 (×14): 1000 mg via ORAL
  Filled 2019-03-27 (×14): qty 1

## 2019-03-27 MED ORDER — ACETAMINOPHEN 650 MG RE SUPP: 650.0000 mg | Freq: Four times a day (QID) | RECTAL | Status: DC | PRN

## 2019-03-27 MED ORDER — VANCOMYCIN HCL IN DEXTROSE 1-5 GM/200ML-% IV SOLN
1000.0000 mg | Freq: Two times a day (BID) | INTRAVENOUS | Status: DC
  Filled 2019-03-27 (×2): qty 200

## 2019-03-27 MED ORDER — METOPROLOL SUCCINATE ER 50 MG PO TB24
50.0000 mg | ORAL_TABLET | Freq: Two times a day (BID) | ORAL | Status: DC
  Administered 2019-03-27 – 2019-04-09 (×29): 50 mg via ORAL
  Filled 2019-03-27 (×28): qty 1

## 2019-03-27 MED ORDER — ALPRAZOLAM 0.5 MG PO TABS
0.5000 mg | ORAL_TABLET | Freq: Two times a day (BID) | ORAL | Status: DC
  Administered 2019-03-27: 1 mg via ORAL
  Administered 2019-03-27: 09:00:00 0.5 mg via ORAL
  Administered 2019-03-28 – 2019-04-04 (×16): 1 mg via ORAL
  Administered 2019-04-05 – 2019-04-09 (×9): 0.5 mg via ORAL
  Filled 2019-03-27 (×4): qty 2
  Filled 2019-03-27: qty 1
  Filled 2019-03-27 (×2): qty 2
  Filled 2019-03-27: qty 1
  Filled 2019-03-27: qty 2
  Filled 2019-03-27: qty 1
  Filled 2019-03-27 (×7): qty 2
  Filled 2019-03-27: qty 1
  Filled 2019-03-27: qty 2
  Filled 2019-03-27 (×3): qty 1
  Filled 2019-03-27 (×2): qty 2
  Filled 2019-03-27: qty 1
  Filled 2019-03-27: qty 2
  Filled 2019-03-27: qty 1

## 2019-03-27 MED ORDER — INSULIN ASPART 100 UNIT/ML ~~LOC~~ SOLN
0.0000 [IU] | Freq: Three times a day (TID) | SUBCUTANEOUS | Status: DC
  Administered 2019-03-27: 5 [IU] via SUBCUTANEOUS
  Administered 2019-03-27: 13:00:00 3 [IU] via SUBCUTANEOUS
  Administered 2019-03-27 – 2019-03-28 (×2): 2 [IU] via SUBCUTANEOUS
  Administered 2019-03-28 – 2019-03-29 (×2): 3 [IU] via SUBCUTANEOUS
  Administered 2019-03-29: 7 [IU] via SUBCUTANEOUS
  Administered 2019-03-29: 18:00:00 3 [IU] via SUBCUTANEOUS
  Administered 2019-03-30: 09:00:00 2 [IU] via SUBCUTANEOUS
  Administered 2019-03-30: 17:00:00 5 [IU] via SUBCUTANEOUS
  Administered 2019-03-30 – 2019-03-31 (×2): 7 [IU] via SUBCUTANEOUS
  Administered 2019-03-31: 08:00:00 2 [IU] via SUBCUTANEOUS
  Administered 2019-03-31 – 2019-04-01 (×4): 3 [IU] via SUBCUTANEOUS
  Administered 2019-04-02 (×3): 2 [IU] via SUBCUTANEOUS
  Administered 2019-04-03 (×3): 3 [IU] via SUBCUTANEOUS
  Administered 2019-04-04: 5 [IU] via SUBCUTANEOUS
  Filled 2019-03-27 (×23): qty 1

## 2019-03-27 MED ORDER — METRONIDAZOLE IN NACL 5-0.79 MG/ML-% IV SOLN
500.0000 mg | Freq: Three times a day (TID) | INTRAVENOUS | Status: DC
  Administered 2019-03-27 – 2019-03-29 (×7): 500 mg via INTRAVENOUS
  Filled 2019-03-27 (×9): qty 100

## 2019-03-27 MED ORDER — SODIUM CHLORIDE 0.9% FLUSH
10.0000 mL | Freq: Two times a day (BID) | INTRAVENOUS | Status: DC
  Administered 2019-03-27 – 2019-04-09 (×22): 10 mL

## 2019-03-27 MED ORDER — NITROGLYCERIN 0.4 MG SL SUBL: 0.4000 mg | SUBLINGUAL_TABLET | SUBLINGUAL | Status: DC | PRN

## 2019-03-27 MED ORDER — GABAPENTIN 300 MG PO CAPS
1200.0000 mg | ORAL_CAPSULE | Freq: Every day | ORAL | Status: DC
  Administered 2019-03-27 – 2019-04-08 (×13): 1200 mg via ORAL
  Filled 2019-03-27 (×13): qty 4

## 2019-03-27 MED ORDER — GABAPENTIN 600 MG PO TABS: 300.0000 mg | ORAL_TABLET | Freq: Every day | ORAL | Status: DC

## 2019-03-27 MED ORDER — MORPHINE SULFATE (PF) 2 MG/ML IV SOLN
2.0000 mg | INTRAVENOUS | Status: DC | PRN
  Administered 2019-03-27 – 2019-04-09 (×21): 2 mg via INTRAVENOUS
  Filled 2019-03-27 (×21): qty 1

## 2019-03-27 NOTE — Progress Notes (Signed)
Pharmacy Antibiotic Note  Mike [name] is a 73 y.o. male admitted on 03/26/2019 with fever s/p fall has a h/o prostate CA s/p prostatectomy meeting 4/4 SIRS currently febrile w/ Tmax 102.15F, neutropenic at 3.6 >> 2.8 w/ ANC of 0.8, LA 2.2, CXR negative, CT head negative, UA non-diagnostic.  Pharmacy has been consulted for vanc/cefepime dosing.  Plan: Patient received vanc 2g IV load, cefepime 2g, flagyl 500 mg IV x 1 in ED  Patient has a h/o DM currently in AKI w/ a baseline Scr of 1.1 - 1.2 (currently Scr 1.43). Will check a random vanc level w/ am labs and reassess renal function at that time post fluid administration. Goal vanc random < 20 mcg/mL. Will redose 15 mg/kg IV x 1 if level below goal or start scheduled regimen if Scr trends down to baseline.  Will continue cefepime 2g IV q12h per CrCl currently of 30 - 60 ml/min and flagyl 500 mg IV q8h and will continue to monitor x s/sx of infx and renal function.  Height: 5\' 7"  (170.2 cm) Weight: 198 lb 6.6 oz (90 kg) IBW/kg (Calculated) : 66.1  Temp (24hrs), Avg:102.4 F (39.1 C), Min:102.4 F (39.1 C), Max:102.4 F (39.1 C)  Recent Labs  Lab 03/26/19 2012  WBC 2.8*  CREATININE 1.43*  LATICACIDVEN 2.2*    Estimated Creatinine Clearance: 48.5 mL/min (A) (by C-G formula based on SCr of 1.43 mg/dL (H)).    Allergies  Allergen Reactions  . Naproxen Sodium     Other reaction(s): Other (See Comments) Upset stomach  . Nsaids     Other reaction(s): Unknown  . Aleve [Naproxen] Nausea And Vomiting  . Ibuprofen Nausea Only    Other reaction(s): Other (See Comments), Unknown Upset stomach     Thank you for allowing pharmacy to be a part of this patient's care.  Tobie Lords, PharmD, BCPS Clinical Pharmacist 03/27/2019 12:30 AM

## 2019-03-27 NOTE — TOC Initial Note (Signed)
Transition of Care [facility]) - Initial/Assessment Note    Patient Details  Name: Mike [name] MRN: [id number] Date of Birth: 1945/12/27  [facility] CM/SW Contact:    Mike Hutching, RN Phone Number: 03/27/2019, 9:47 AM  Clinical Narrative:                 Patient admitted after a fall at home.  Patient reports that he does not remember falling but he is the one who called EMS.  Patient lives alone and he has never been married and has no children.  Patient does have 2 sisters and 2 brothers, he is the oldest.  Educational psychologist and Mike [name], brothers Mike [name] and Mike [name].  Patient reports that he walks with a cane and also has a walker and shower chair at home.  Sister Mike [name] will come over and help him with bathing.  Siblings provide transportation and take him grocery shopping.  PCP is Mike [name], pharmacy is [facility].  Patient would be agreeable to home health but is not interested in SNF at this time.  Patient is getting a unit of PRBC's at this time and has had some confusion.   RNCM will cont to follow, patient will need PT eval.   Expected Discharge Plan: [place] Barriers to Discharge: Continued Medical Work up   Patient Goals and CMS Choice Patient states their goals for this hospitalization and ongoing recovery are:: would like to go home CMS Medicare.gov Compare Post Acute Care list provided to:: Patient Choice offered to / list presented to : Patient  Expected Discharge Plan and Services Expected Discharge Plan: [place]   Discharge Planning Services: CM Consult Post Acute Care Choice: [place] arrangements for the past 2 months: Single Family Home                                      Prior Living Arrangements/Services Living arrangements for the past 2 months: Single Family Home Lives with:: Self Patient language and need for interpreter reviewed:: No Do you feel safe going back to the place where you live?:  Yes      Need for Family Participation in Patient Care: Yes (Comment)(COPD, diabetes, history of seizure) Care giver support system in place?: Yes (comment)(sisters and  brothers)   Criminal Activity/Legal Involvement Pertinent to Current Situation/Hospitalization: No - Comment as needed  Activities of Daily Living   ADL Screening (condition at time of admission) Patient's cognitive ability adequate to safely complete daily activities?: No Is the patient deaf or have difficulty hearing?: No  Permission Sought/Granted Permission sought to share information with : Case Manager, Family Supports, Other (comment) Permission granted to share information with : Yes, Verbal Permission Granted     Permission granted to share info w AGENCY: [facility] agency  Permission granted to share info w Relationship: Sister [name]     Emotional Assessment Appearance:: Appears stated age Attitude/Demeanor/Rapport: Engaged Affect (typically observed): Accepting Orientation: : Oriented to Self, Oriented to Place, Oriented to  Time, Oriented to Situation Alcohol / Substance Use: Not Applicable Psych Involvement: No (comment)  Admission diagnosis:  Fever [R50.9] Fall, initial encounter [W19.XXXA] Altered mental status, unspecified altered mental status type [R41.82] Sepsis, due to unspecified organism, unspecified whether acute organ dysfunction present [facility]) [A41.9] Patient Active Problem List   Diagnosis Date Noted  . Sepsis ([place]) 03/27/2019  .  AKI (acute kidney injury) ([place]) 07/24/2018  . 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 ([place]) 06/13/2018   PCP:  Adin Hector, MD Pharmacy:   [facility] [id number], [place] [address] Phone: [phone] Fax: [phone]     Social Determinants of Health (SDOH) Interventions    Readmission Risk Interventions No flowsheet data found.

## 2019-03-27 NOTE — Progress Notes (Signed)
Pharmacy Antibiotic Note  Mike [name] is a 73 y.o. male admitted on 03/26/2019 with fever s/p fall has a h/o prostate CA s/p prostatectomy meeting 4/4 SIRS currently febrile w/ Tmax 102.49F, neutropenic at 3.6 >> 2.8 w/ ANC of 0.8, LA 2.2, CXR negative, CT head negative, UA non-diagnostic.  Pharmacy has been consulted for vanc/cefepime dosing.  Plan: [id number] @ 0300 VR 22 mcg/mL (drawn 4 hours post dose), renal function has improved w/ Scr now down to 1.3 which roughly close to his baseline; therefore will start the regimen below and will check a BMP w/ am labs to assess renal function.  Vancomycin 1000 mg IV Q 24 hrs. Goal AUC 400-550. Expected AUC: 515.6 SCr used: 1.3 Cssmin: 12.8  Will continue cefepime 2g IV q12h per CrCl currently of 30 - 60 ml/min and flagyl 500 mg IV q8h and will continue to monitor x s/sx of infx and renal function.  Height: 5\' 7"  (170.2 cm) Weight: 198 lb 6.6 oz (90 kg) IBW/kg (Calculated) : 66.1  Temp (24hrs), Avg:102 F (38.9 C), Min:100.6 F (38.1 C), Max:103.1 F (39.5 C)  Recent Labs  Lab 03/26/19 2012 03/27/19 0255  WBC 2.8* 2.9*  CREATININE 1.43* 1.30*  LATICACIDVEN 2.2* 2.3*  VANCORANDOM  --  22    Estimated Creatinine Clearance: 53.4 mL/min (A) (by C-G formula based on SCr of 1.3 mg/dL (H)).    Allergies  Allergen Reactions  . Naproxen Sodium Other (See Comments)    Reaction: Upset stomach  . Nsaids Other (See Comments)    Other reaction(s): Unknown  . Aleve [Naproxen] Nausea And Vomiting  . Ibuprofen Nausea Only    Reaction: Upset stomach     Thank you for allowing pharmacy to be a part of this patient's care.  Tobie Lords, PharmD, BCPS Clinical Pharmacist 03/27/2019 6:32 AM

## 2019-03-27 NOTE — Consult Note (Signed)
[facility]  Date of admission:  19/12/2018  Inpatient day:  03/27/2019  Consulting physician: Dr. Abel Presto   Reason for Consultation:  Pancytopenia  Chief Complaint: Mike [name] is a 73 y.o. male with a history of prostate cancer, diabetes, and a seizure disorder who was admitted with presumed sepsis.  HPI:  Patient is a poor historian.  Chart was reviewed and discussed with nursing.  He presented to the emergency room for an unwitnessed fall at home.  He does not recall the events of.  He denied any fever, shortness of breath, cough, urinary symptoms or diarrhea.  On presentation, he had a fever of 102.4 pulse of 132.    Initial work-up was negative for COVID-19.  Urinalysis was negative.  Head CT without contrast revealed no acute abnormality.  Chest x-ray revealed no pneumonia.  CBC revealed a hematocrit of 25.5, hemoglobin 7.9, MCV 69.1, platelets 248,000, white count 2800 with an ANC 800.  Creatinine was 1.43.  Liver function tests were normal.  BNP was 125.  Lactic acid was 2.2.  He was started on broad-spectrum antibiotics.  Blood and urine cultures are negative to date.  Anemia work-up has included a ferritin of 425 with an iron saturation of 9% and a TIBC of 255.  B12 was 2930.  Folate was 30.    Peripheral smear revealed a microcytic hypochromic anemia with anisopoikilocytosis.  There are teardrop cells and elliptocytes.  There was absolute leukopenia with relative lymphocytosis and a few large granular lymphocytes.  Absolute neutrophil count was 820.  Neutrophils were unremarkable.  There was mild thrombocytopenia with normal platelet morphology.  There was no platelet clumping.  He has been transfused with 1 unit of packed red blood cells.  Hematocrit was 25.7 and hemoglobin 8.1 on 03/27/2019.  Review of prior labs in [facility] as well as [place] confirms a chronic microcytic anemia dating back to at least 08/23/2010.  MCV has ranged between the  61-66.  CBC on 02/18/2019 included a hematocrit 26.2, hemoglobin 8.1, platelets 154,000, white count 2800 with an ANC of 1240.  TSH was normal on 01/02/2019.  He was documented to have B12 deficiency in 12/2018 and has been on oral B12.  Subsequent B12 levels have been normal as well as folate.  Lead level was negative.  Ferritin has been normal with normal iron saturations.   Past Medical History:  Diagnosis Date  . Anxiety   . Diabetes mellitus without complication ([place])   . Epilepsy ([place])   . High cholesterol   . Hypertension   . Prostate cancer [facility]) 2004   prostate removed    Past Surgical History:  Procedure Laterality Date  . CARDIAC SURGERY    . IRRIGATION AND DEBRIDEMENT FOOT Right 06/14/2018   Procedure: IRRIGATION AND DEBRIDEMENT FOOT;  Surgeon: Sharlotte Alamo, DPM;  Location: [facility] ORS;  Service: Podiatry;  Laterality: Right;    No family history on file.  Social History:  reports that he has never smoked. He has never used smokeless tobacco. He reports that he does not drink alcohol or use drugs.  The patient apparently lives independently with 2 brothers and 2 sisters who check on him frequently. He lives in [place].  He is accompanied by his nurse today.  Allergies:  Allergies  Allergen Reactions  . Naproxen Sodium Other (See Comments)    Reaction: Upset stomach  . Nsaids Other (See Comments)    Other reaction(s): Unknown  . Aleve [Naproxen] Nausea  And Vomiting  . Ibuprofen Nausea Only    Reaction: Upset stomach     Medications Prior to Admission  Medication Sig Dispense Refill  . acetaminophen-codeine (TYLENOL #3) 300-30 MG tablet Take 1 tablet by mouth every 8 (eight) hours as needed for moderate pain. 20 tablet 0  . ALPRAZolam (XANAX) 0.5 MG tablet Take 0.5-1 mg by mouth 2 (two) times daily. 0.5 mg in the morning and 1 mg at bedtime    . aspirin EC 81 MG tablet Take 81 mg by mouth daily.    Marland Kitchen atorvastatin (LIPITOR) 20 MG tablet Take 20 mg by mouth daily.     . Cholecalciferol (VITAMIN D-1000 MAX ST) 25 MCG (1000 UT) tablet Take 1,000 Units by mouth daily.    . Cyanocobalamin (VITAMIN B-12 CR) 1000 MCG TBCR Take 2,000 mcg by mouth daily.     . furosemide (LASIX) 40 MG tablet Take 40 mg by mouth daily as needed for edema.    . gabapentin (NEURONTIN) 300 MG capsule Take 300 mg by mouth daily.    Marland Kitchen gabapentin (NEURONTIN) 300 MG capsule Take 1,200 mg by mouth at bedtime.    Marland Kitchen glimepiride (AMARYL) 2 MG tablet Take 2-4 mg by mouth 2 (two) times daily. 4 mg in the morning and 2 mg in the evening    . hydrOXYzine (ATARAX/VISTARIL) 25 MG tablet Take 25-50 mg by mouth 2 (two) times daily. 25 mg in the morning and 50 mg at bedtime    . irbesartan (AVAPRO) 300 MG tablet Take 300 mg by mouth daily.     . metoprolol succinate (TOPROL-XL) 50 MG 24 hr tablet Take 50 mg by mouth 2 (two) times daily.    . nitroGLYCERIN (NITROSTAT) 0.4 MG SL tablet Take 0.4 mg by mouth every 5 (five) minutes as needed for chest pain.     . Omega-3 1000 MG CAPS Take 1,000 mg by mouth daily.    Marland Kitchen omeprazole (PRILOSEC) 20 MG capsule Take 20 mg by mouth daily.     Marland Kitchen PARoxetine (PAXIL) 20 MG tablet Take 1 tablet (20 mg total) by mouth daily. 30 tablet 1  . PHENobarbital (LUMINAL) 64.8 MG tablet Take 194.4 mg by mouth daily.    . pioglitazone (ACTOS) 30 MG tablet Take 30 mg by mouth daily.    . potassium chloride (KLOR-CON) 10 MEQ tablet Take 10 mEq by mouth daily as needed (when taking furosemide).       Review of Systems: Patient unable to provide an accurate ROS despite multiple questions.  Physical Exam:  Blood pressure (!) 133/55, pulse 79, temperature 99.3 F (37.4 C), temperature source Oral, resp. rate 18, height 5\' 7"  (1.702 m), weight 198 lb 6.6 oz (90 kg), SpO2 98 %.  GENERAL:  Chronically fatigued appearing gentleman sitting up naked on the medical unit attempting to eat dinner. MENTAL STATUS:  Alert and somewhat interactive with prompting. HEAD:  Pearline Cables hair.   Normocephalic, atraumatic, face symmetric, no Cushingoid features. EYES:  Dark rimmed glasses.  Pupils equal round and reactive to light and accomodation.  No conjunctivitis or scleral icterus. ENT:  Oropharynx clear without lesion.  Tongue normal. Mucous membranes moist.  RESPIRATORY:  Clear to auscultation anteriroly without rales, wheezes or rhonchi. CARDIOVASCULAR:  Regular rate and rhythm without murmur, rub or gallop. ABDOMEN:  Soft, non-tender, with active bowel sounds, and no appreciable hepatosplenomegaly.  LUQ full.  No masses. SKIN:  No rashes, ulcers or lesions. EXTREMITIES: Lower extremity changes and abrasions with small eschars.  No edema.  No palpable cords. LYMPH NODES: No palpable cervical, supraclavicular, axillary or inguinal adenopathy  NEUROLOGICAL: Poorly interactive.   Results for orders placed or performed during the hospital encounter of 03/26/19 (from the past 48 hour(s))  Comprehensive metabolic panel     Status: Abnormal   Collection Time: 03/26/19  8:12 PM  Result Value Ref Range   Sodium 138 135 - 145 mmol/L   Potassium 4.4 3.5 - 5.1 mmol/L   Chloride 100 98 - 111 mmol/L   CO2 27 22 - 32 mmol/L   Glucose, Bld 197 (H) 70 - 99 mg/dL   BUN 30 (H) 8 - 23 mg/dL   Creatinine, Ser 1.43 (H) 0.61 - 1.24 mg/dL   Calcium 9.5 8.9 - 10.3 mg/dL   Total Protein 8.3 (H) 6.5 - 8.1 g/dL   Albumin 4.5 3.5 - 5.0 g/dL   AST 20 15 - 41 U/L   ALT 14 0 - 44 U/L   Alkaline Phosphatase 116 38 - 126 U/L   Total Bilirubin 0.9 0.3 - 1.2 mg/dL   GFR calc non Af Amer 48 (L) >60 mL/min   GFR calc Af Amer 56 (L) >60 mL/min   Anion gap 11 5 - 15    Comment: Performed at [facility], [address] [place], [place] [address]  Lactic acid, plasma     Status: Abnormal   Collection Time: 03/26/19  8:12 PM  Result Value Ref Range   Lactic Acid, Venous 2.2 (HH) 0.5 - 1.9 mmol/L    Comment: CRITICAL RESULT CALLED TO, READ BACK BY AND VERIFIED WITH DEE MCCLAIN RN AT 2046 ON  03/26/2019 SNG Performed at [facility], [address] [place], [place] [address]   CBC with Differential     Status: Abnormal   Collection Time: 03/26/19  8:12 PM  Result Value Ref Range   WBC 2.8 (L) 4.0 - 10.5 K/uL   RBC 3.69 (L) 4.22 - 5.81 MIL/uL   Hemoglobin 7.9 (L) 13.0 - 17.0 g/dL    Comment: Reticulocyte Hemoglobin testing may be clinically indicated, consider ordering this additional test [id number]    HCT 25.5 (L) 39.0 - 52.0 %   MCV 69.1 (L) 80.0 - 100.0 fL   MCH 21.4 (L) 26.0 - 34.0 pg   MCHC 31.0 30.0 - 36.0 g/dL   RDW 19.0 (H) 11.5 - 15.5 %   Platelets 148 (L) 150 - 400 K/uL   nRBC 1.4 (H) 0.0 - 0.2 %   Neutrophils Relative % 29 %   Neutro Abs 0.8 (L) 1.7 - 7.7 K/uL   Lymphocytes Relative 47 %   Lymphs Abs 1.3 0.7 - 4.0 K/uL   Monocytes Relative 16 %   Monocytes Absolute 0.5 0.1 - 1.0 K/uL   Eosinophils Relative 2 %   Eosinophils Absolute 0.1 0.0 - 0.5 K/uL   Basophils Relative 0 %   Basophils Absolute 0.0 0.0 - 0.1 K/uL   WBC Morphology MORPHOLOGY UNREMARKABLE    Smear Review PLATELETS APPEAR ADEQUATE    Immature Granulocytes 6 %   Abs Immature Granulocytes 0.17 (H) 0.00 - 0.07 K/uL   Tear Drop Cells PRESENT    Polychromasia PRESENT     Comment: Performed at [facility], [address] [place], [place] [address]  Protime-INR     Status: None   Collection Time: 03/26/19  8:12 PM  Result Value Ref Range   Prothrombin Time 14.1 11.4 - 15.2 seconds   INR 1.1 0.8 -  1.2    Comment: (NOTE) INR goal varies based on device and disease states. Performed at [facility], [address] [place], [place] [address]   Culture, blood (Routine x 2)     Status: None (Preliminary result)   Collection Time: 03/26/19  8:12 PM   Specimen: BLOOD  Result Value Ref Range   Specimen Description BLOOD LEFT ANTECUBITAL    Special Requests      BOTTLES DRAWN AEROBIC AND ANAEROBIC Blood Culture adequate volume   Culture      NO GROWTH < 12  HOURS Performed at [facility], [address], [place], [place] [address]    Report Status PENDING   Pathologist smear review     Status: None   Collection Time: 03/26/19  8:12 PM  Result Value Ref Range   Path Review Blood smear is reviewed.     Comment: Microcytic, hypochromic anemia, with anisopoikilocytosis, including teardrop cells and elliptocytes. Absolute leukopenia with relative lymphocytosis and few large granular lymphocytes. Absolute neutrophil count of 820 per uL. Neutrophils with unremarkable morphology. Mild thrombocytopenia with normal morphology. No platelet clumping or satellitism.  The cause of the patient's pancytopenia is unclear from morphologic evaluation.  Reviewed by Kathi Simpers, M.D. Performed at [facility], [address] [place], [place] [address]   Procalcitonin     Status: None   Collection Time: 03/26/19  8:12 PM  Result Value Ref Range   Procalcitonin <0.10 ng/mL    Comment:        Interpretation: PCT (Procalcitonin) <= 0.5 ng/mL: Systemic infection (sepsis) is not likely. Local bacterial infection is possible. (NOTE)       Sepsis PCT Algorithm           Lower Respiratory Tract                                      Infection PCT Algorithm    ----------------------------     ----------------------------         PCT < 0.25 ng/mL                PCT < 0.10 ng/mL         Strongly encourage             Strongly discourage   discontinuation of antibiotics    initiation of antibiotics    ----------------------------     -----------------------------       PCT 0.25 - 0.50 ng/mL            PCT 0.10 - 0.25 ng/mL               OR       >80% decrease in PCT            Discourage initiation of                                            antibiotics      Encourage discontinuation           of antibiotics    ----------------------------     -----------------------------         PCT >= 0.50 ng/mL              PCT 0.26 - 0.50 ng/mL  AND        <80% decrease in PCT             Encourage initiation of                                             antibiotics       Encourage continuation           of antibiotics    ----------------------------     -----------------------------        PCT >= 0.50 ng/mL                  PCT > 0.50 ng/mL               AND         increase in PCT                  Strongly encourage                                      initiation of antibiotics    Strongly encourage escalation           of antibiotics                                     -----------------------------                                           PCT <= 0.25 ng/mL                                                 OR                                        > 80% decrease in PCT                                     Discontinue / Do not initiate                                             antibiotics Performed at [facility], [address] [place], [place] [address]   Brain natriuretic peptide     Status: Abnormal   Collection Time: 03/26/19  8:12 PM  Result Value Ref Range   B Natriuretic Peptide 125.0 (H) 0.0 - 100.0 pg/mL    Comment: Performed at [facility], [address] [place], [place] [address]  Culture, blood (Routine x 2)     Status: None (Preliminary result)   Collection Time: 03/26/19  8:50 PM   Specimen: BLOOD  Result Value Ref Range   Specimen Description BLOOD BLOOD RIGHT HAND  Special Requests      BOTTLES DRAWN AEROBIC AND ANAEROBIC Blood Culture adequate volume   Culture      NO GROWTH < 12 HOURS Performed at [facility], [address] [place], [place] [address]    Report Status PENDING   SARS Coronavirus 2 [facility] order, Performed in [facility] hospital lab) Nasopharyngeal Nasopharyngeal Swab     Status: None   Collection Time: 03/26/19  8:50 PM   Specimen: Nasopharyngeal Swab  Result Value Ref Range   SARS Coronavirus 2 NEGATIVE NEGATIVE    Comment:  (NOTE) If result is NEGATIVE SARS-CoV-2 target nucleic acids are NOT DETECTED. The SARS-CoV-2 RNA is generally detectable in upper and lower  respiratory specimens during the acute phase of infection. The lowest  concentration of SARS-CoV-2 viral copies this assay can detect is 250  copies / mL. A negative result does not preclude SARS-CoV-2 infection  and should not be used as the sole basis for treatment or other  patient management decisions.  A negative result may occur with  improper specimen collection / handling, submission of specimen other  than nasopharyngeal swab, presence of viral mutation(s) within the  areas targeted by this assay, and inadequate number of viral copies  (<250 copies / mL). A negative result must be combined with clinical  observations, patient history, and epidemiological information. If result is POSITIVE SARS-CoV-2 target nucleic acids are DETECTED. The SARS-CoV-2 RNA is generally detectable in upper and lower  respiratory specimens dur ing the acute phase of infection.  Positive  results are indicative of active infection with SARS-CoV-2.  Clinical  correlation with patient history and other diagnostic information is  necessary to determine patient infection status.  Positive results do  not rule out bacterial infection or co-infection with other viruses. If result is PRESUMPTIVE POSTIVE SARS-CoV-2 nucleic acids MAY BE PRESENT.   A presumptive positive result was obtained on the submitted specimen  and confirmed on repeat testing.  While 2019 novel coronavirus  (SARS-CoV-2) nucleic acids may be present in the submitted sample  additional confirmatory testing may be necessary for epidemiological  and / or clinical management purposes  to differentiate between  SARS-CoV-2 and other Sarbecovirus currently known to infect humans.  If clinically indicated additional testing with an alternate test  methodology [phone]) is advised. The SARS-CoV-2 RNA is  generally  detectable in upper and lower respiratory sp ecimens during the acute  phase of infection. The expected result is Negative. Fact Sheet for Patients:  StrictlyIdeas.no Fact Sheet for Healthcare Providers: BankingDealers.co.za This test is not yet approved or cleared by the [place] FDA and has been authorized for detection and/or diagnosis of SARS-CoV-2 by FDA under an Emergency Use Authorization (EUA).  This EUA will remain in effect (meaning this test can be used) for the duration of the COVID-19 declaration under Section 564(b)(1) of the Act, 21 U.S.C. section 360bbb-3(b)(1), unless the authorization is terminated or revoked sooner. Performed at [facility], [address] [place], [place] [address]   Urinalysis, Complete w Microscopic     Status: Abnormal   Collection Time: 03/26/19  8:51 PM  Result Value Ref Range   Color, Urine YELLOW (A) YELLOW   APPearance CLEAR (A) CLEAR   Specific Gravity, Urine 1.012 1.005 - 1.030   pH 7.0 5.0 - 8.0   Glucose, UA >=500 (A) NEGATIVE mg/dL   Hgb urine dipstick NEGATIVE NEGATIVE   Bilirubin Urine NEGATIVE NEGATIVE   Ketones, ur NEGATIVE NEGATIVE mg/dL  Protein, ur NEGATIVE NEGATIVE mg/dL   Nitrite NEGATIVE NEGATIVE   Leukocytes,Ua NEGATIVE NEGATIVE   WBC, UA 0-5 0 - 5 WBC/hpf   Bacteria, UA NONE SEEN NONE SEEN   Squamous Epithelial / LPF NONE SEEN 0 - 5    Comment: Performed at [facility], [address] [place], [place] [address]  Lactic acid, plasma     Status: Abnormal   Collection Time: 03/27/19  2:55 AM  Result Value Ref Range   Lactic Acid, Venous 2.3 (HH) 0.5 - 1.9 mmol/L    Comment: CRITICAL RESULT CALLED TO, READ BACK BY AND VERIFIED WITH Jeanette Caprice TORAIN  AT [id number] ON 03/27/19 RWW Performed at [facility], [address] [place], [place] [address]   CBC     Status: Abnormal   Collection Time: 03/27/19  2:55 AM  Result Value Ref  Range   WBC 2.9 (L) 4.0 - 10.5 K/uL   RBC 3.11 (L) 4.22 - 5.81 MIL/uL   Hemoglobin 6.6 (L) 13.0 - 17.0 g/dL    Comment: Reticulocyte Hemoglobin testing may be clinically indicated, consider ordering this additional test [id number]    HCT 21.7 (L) 39.0 - 52.0 %   MCV 69.8 (L) 80.0 - 100.0 fL   MCH 21.2 (L) 26.0 - 34.0 pg   MCHC 30.4 30.0 - 36.0 g/dL   RDW 19.0 (H) 11.5 - 15.5 %   Platelets 118 (L) 150 - 400 K/uL    Comment: Immature Platelet Fraction may be clinically indicated, consider ordering this additional test [id number]    nRBC 1.4 (H) 0.0 - 0.2 %    Comment: Performed at [facility], [address] [place], [place] [id number]  Basic metabolic panel     Status: Abnormal   Collection Time: 03/27/19  2:55 AM  Result Value Ref Range   Sodium 140 135 - 145 mmol/L   Potassium 4.7 3.5 - 5.1 mmol/L   Chloride 105 98 - 111 mmol/L   CO2 24 22 - 32 mmol/L   Glucose, Bld 187 (H) 70 - 99 mg/dL   BUN 25 (H) 8 - 23 mg/dL   Creatinine, Ser 1.30 (H) 0.61 - 1.24 mg/dL   Calcium 8.6 (L) 8.9 - 10.3 mg/dL   GFR calc non Af Amer 54 (L) >60 mL/min   GFR calc Af Amer >60 >60 mL/min   Anion gap 11 5 - 15    Comment: Performed at [facility], [address] [place], [place] [address]  Vancomycin, random     Status: None   Collection Time: 03/27/19  2:55 AM  Result Value Ref Range   Vancomycin Rm 22     Comment:        Random Vancomycin therapeutic range is dependent on dosage and time of specimen collection. A peak range is 20.0-40.0 ug/mL A trough range is 5.0-15.0 ug/mL        Performed at [facility], [address] [place], [place] [address]   ABO/Rh     Status: None   Collection Time: 03/27/19  2:55 AM  Result Value Ref Range   ABO/RH(D)      O POS Performed at [facility], [place], [place] [address]   Iron and TIBC     Status: Abnormal   Collection Time: 03/27/19  4:30 AM  Result Value Ref Range   Iron 23 (L) 45  - 182 ug/dL   TIBC 255 250 - 450 ug/dL   Saturation  Ratios 9 (L) 17.9 - 39.5 %   UIBC 232 ug/dL    Comment: Performed at [facility], [address] [place], [place] [address]  Ferritin     Status: Abnormal   Collection Time: 03/27/19  4:30 AM  Result Value Ref Range   Ferritin 425 (H) 24 - 336 ng/mL    Comment: Performed at [facility], [address] [place], [place] [address]  Vitamin B12     Status: Abnormal   Collection Time: 03/27/19  4:30 AM  Result Value Ref Range   Vitamin B-12 2,930 (H) 180 - 914 pg/mL    Comment: (NOTE) This assay is not validated for testing neonatal or myeloproliferative syndrome specimens for Vitamin B12 levels. Performed at [facility], [address] [place], [place] [address]   Folate     Status: None   Collection Time: 03/27/19  4:30 AM  Result Value Ref Range   Folate 30.0 >5.9 ng/mL    Comment: Performed at [facility], [address] [place], [place] [address]  Hemoglobin A1c     Status: Abnormal   Collection Time: 03/27/19  4:30 AM  Result Value Ref Range   Hgb A1c MFr Bld 9.1 (H) 4.8 - 5.6 %    Comment: (NOTE) Pre diabetes:          5.7%-6.4% Diabetes:              >6.4% Glycemic control for   <7.0% adults with diabetes    Mean Plasma Glucose 214.47 mg/dL    Comment: Performed at [address] [place], [place] [address]  Phenobarbital level     Status: None   Collection Time: 03/27/19  4:30 AM  Result Value Ref Range   Phenobarbital 24.5 15.0 - 30.0 ug/mL    Comment: Performed at [place] [address], [place], [place] [address]  Prepare RBC     Status: None   Collection Time: 03/27/19  6:30 AM  Result Value Ref Range   Order Confirmation      ORDER PROCESSED BY BLOOD BANK Performed at [facility], [address] [place], [place] [address]   Type and screen [place]     Status: None (Preliminary result)    Collection Time: 03/27/19  6:32 AM  Result Value Ref Range   ABO/RH(D) O POS    Antibody Screen NEG    Sample Expiration 03/30/2019,2359    Unit Number [id number]    Blood Component Type RED CELLS,LR    Unit division 00    Status of Unit ISSUED    Transfusion Status OK TO TRANSFUSE    Crossmatch Result      Compatible Performed at [facility], [place], [place] [address]   Glucose, capillary     Status: Abnormal   Collection Time: 03/27/19  8:01 AM  Result Value Ref Range   Glucose-Capillary 287 (H) 70 - 99 mg/dL   Comment 1 Notify RN   MRSA PCR Screening     Status: None   Collection Time: 03/27/19 10:00 AM   Specimen: Nasal Mucosa; Nasopharyngeal  Result Value Ref Range   MRSA by PCR NEGATIVE NEGATIVE    Comment:        The GeneXpert MRSA Assay (FDA approved for NASAL specimens only), is one component of a comprehensive MRSA colonization surveillance program. It is not intended to diagnose MRSA infection nor to  guide or monitor treatment for MRSA infections. Performed at [facility], [address] [place], [place] [address]   Glucose, capillary     Status: Abnormal   Collection Time: 03/27/19 12:01 PM  Result Value Ref Range   Glucose-Capillary 234 (H) 70 - 99 mg/dL  Occult blood card to lab, stool     Status: None   Collection Time: 03/27/19  1:19 PM  Result Value Ref Range   Fecal Occult Bld NEGATIVE NEGATIVE    Comment: Performed at [facility], [address] [place], [place] [address]  Hemoglobin and hematocrit, blood     Status: Abnormal   Collection Time: 03/27/19  2:05 PM  Result Value Ref Range   Hemoglobin 8.1 (L) 13.0 - 17.0 g/dL   HCT 25.7 (L) 39.0 - 52.0 %    Comment: Performed at [facility], [address] [place], [place] [address]  Glucose, capillary     Status: Abnormal   Collection Time: 03/27/19  4:50 PM  Result Value Ref Range   Glucose-Capillary 152 (H) 70 - 99 mg/dL   Glucose, capillary     Status: Abnormal   Collection Time: 03/27/19  9:51 PM  Result Value Ref Range   Glucose-Capillary 152 (H) 70 - 99 mg/dL   Dg Chest 1 View  Result Date: 03/26/2019 CLINICAL DATA:  UNWITNESSED FALL EXAM: CHEST  1 VIEW COMPARISON:  January 07, 2018 FINDINGS: There is mild cardiomegaly. Overlying median sternotomy wires. Both lungs are clear. 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 IMPRESSION: No acute cardiopulmonary process. Electronically Signed   By: Prudencio Pair M.D.   On: 03/26/2019 21:03   Ct Head Wo Contrast  Result Date: 03/26/2019 CLINICAL DATA:  Head trauma, unwitnessed fall EXAM: CT HEAD WITHOUT CONTRAST TECHNIQUE: Contiguous axial images were obtained from the base of the skull through the vertex without intravenous contrast. COMPARISON:  February 25, 2019 FINDINGS: Brain: No evidence of acute territorial infarction, hemorrhage, hydrocephalus,extra-axial collection or mass lesion/mass effect. There is mild dilatation the ventricles and sulci consistent with age-related atrophy. Low-attenuation changes in the deep white matter consistent with small vessel ischemia. Vascular: No hyperdense vessel or unexpected calcification. Skull: The skull is intact. No fracture or focal lesion identified. Sinuses/Orbits: The visualized paranasal sinuses and mastoid air cells are clear. The orbits and globes intact. Other: None IMPRESSION: No acute intracranial abnormality. Findings consistent with age related atrophy and chronic small vessel ischemia Electronically Signed   By: Prudencio Pair M.D.   On: 03/26/2019 22:36    Assessment:  The patient is a 73 y.o. gentleman with a seizure disorder who was admitted with presumed sepsis.  He has a history of a chronic microcytic anemia and intermittent leukopenia.   CBC on admission revealed a hematocrit of 25.5, hemoglobin 7.9, MCV 69.1, platelets 248,000, white count 2800 with an ANC 800.  Creatinine was 1.43.  Liver function tests were  normal.  Lactic acid was 2.2.  Anemia work-up has included a ferritin of 425 with an iron saturation of 9% and a TIBC of 255.  B12 was 2930.  Folate was 30.    Peripheral smear revealed a microcytic hypochromic anemia with anisopoikilocytosis.  There were teardrop cells and elliptocytes.  There was absolute leukopenia with relative lymphocytosis and a few large granular lymphocytes.  Absolute neutrophil count was 820.  Refills were unremarkable.  There was mild thrombocytopenia with normal platelet morphology.  There was no platelet clumping.  Patient has a history of prostate cancer  s/p radical prostatectomy.  He has B12 deficiency and has been on oral B12.  Symptomatically, he remains confused.  Exam reveals no appreciable hepatosplenomegaly.  LUQ is full.  Plan:   1.  Pancytopenia  Patient has a chronic baseline microcytic anemia.  He has no evidence of bleeding.  Counts have worsened with this admission and likely due to underlying infection and poor marrow reserve.  Ferritin may be falsely elevated secondary to an acute phase reactant.  A low iron saturation suggests a possible iron deficiency.  Patient has had an appropriate response to the PRBCs.  Doubt any component of hemolysis.  Unclear significance of large granular lymphocytes.  Review of medications:   Paxil (< 1% incidence of pancytopenia), phenobarbitol (undefined incidence of agranulocytosis, thrombocytopenia, and macrocytic anemia),   Septra (leukopenia, thrombocytopenia, anemia).  Follow CBC daily.  Neutropenic precautions.  Thank you for allowing me to participate in Mike [name] 's care.  I will follow him closely with you while hospitalized and after discharge in the outpatient department.   Lequita Asal, MD  03/27/2019, 10:11 PM

## 2019-03-27 NOTE — Progress Notes (Signed)
MD notified of pt c/o severe rectal pain whenever he passes gas/stool. No blood noted in stool. Stool is soft and brown in color. No new orders. Will continue to monitor.

## 2019-03-27 NOTE — Progress Notes (Signed)
[place] at [place] NAME: Mike [name]    MR#:  [id number]  DATE OF BIRTH:  1946/01/20  SUBJECTIVE:   Pt. Presented to the hospital due to fever and also a fall and noted to be Anemic.  Patient denies any chest pain, shortness of breath or any other complaints.    REVIEW OF SYSTEMS:    Review of Systems  Constitutional: Negative for chills and fever.  HENT: Negative for congestion and tinnitus.   Eyes: Negative for blurred vision and double vision.  Respiratory: Negative for cough, shortness of breath and wheezing.   Cardiovascular: Negative for chest pain, orthopnea and PND.  Gastrointestinal: Negative for abdominal pain, diarrhea, nausea and vomiting.  Genitourinary: Negative for dysuria and hematuria.  Musculoskeletal: Positive for falls.  Neurological: Positive for weakness (generalized). Negative for dizziness, sensory change and focal weakness.  All other systems reviewed and are negative.   Nutrition: Heart Healthy/Carb control Tolerating Diet: Yes Tolerating PT: Await Eval.      DRUG ALLERGIES:   Allergies  Allergen Reactions  . Naproxen Sodium Other (See Comments)    Reaction: Upset stomach  . Nsaids Other (See Comments)    Other reaction(s): Unknown  . Aleve [Naproxen] Nausea And Vomiting  . Ibuprofen Nausea Only    Reaction: Upset stomach     VITALS:  Blood pressure (!) 145/84, pulse 64, temperature 99 F (37.2 C), temperature source Oral, resp. rate 18, height _0  (1.702 m), weight 90 kg, SpO2 100 %.  PHYSICAL EXAMINATION:   Physical Exam  GENERAL:  73 y.o.-year-old obese patient lying in bed in no acute distress.  EYES: Pupils equal, round, reactive to light and accommodation. No scleral icterus. Extraocular muscles intact.  HEENT: Head atraumatic, normocephalic. Oropharynx and nasopharynx clear.  NECK:  Supple, no jugular venous distention. No thyroid enlargement, no tenderness.  LUNGS: Normal  breath sounds bilaterally, no wheezing, rales, rhonchi. No use of accessory muscles of respiration.  CARDIOVASCULAR: S1, S2 normal. No murmurs, rubs, or gallops.  ABDOMEN: Soft, nontender, nondistended. Bowel sounds present. No organomegaly or mass.  EXTREMITIES: No cyanosis, clubbing or edema b/l.    NEUROLOGIC: Cranial nerves II through XII are intact. No focal Motor or sensory deficits b/l.  Globally weak.  PSYCHIATRIC: The patient is alert and oriented x 3.  SKIN: No obvious rash, lesion, or ulcer.    LABORATORY PANEL:   CBC Recent Labs  Lab 03/27/19 0255  WBC 2.9*  HGB 6.6*  HCT 21.7*  PLT 118*   ------------------------------------------------------------------------------------------------------------------  Chemistries  Recent Labs  Lab 03/26/19 2012 03/27/19 0255  NA 138 140  K 4.4 4.7  CL 100 105  CO2 27 24  GLUCOSE 197* 187*  BUN 30* 25*  CREATININE 1.43* 1.30*  CALCIUM 9.5 8.6*  AST 20  --   ALT 14  --   ALKPHOS 116  --   BILITOT 0.9  --    ------------------------------------------------------------------------------------------------------------------  Cardiac Enzymes No results for input(s): TROPONINI in the last 168 hours. ------------------------------------------------------------------------------------------------------------------  RADIOLOGY:  Dg Chest 1 View  Result Date: 03/26/2019 CLINICAL DATA:  UNWITNESSED FALL EXAM: CHEST  1 VIEW COMPARISON:  January 07, 2018 FINDINGS: There is mild cardiomegaly. Overlying median sternotomy wires. Both lungs are clear. 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 IMPRESSION: No acute cardiopulmonary process. Electronically Signed   By: Prudencio Pair M.D.   On: 03/26/2019 21:03   Ct Head Wo Contrast  Result Date: 03/26/2019 CLINICAL DATA:  Head trauma, unwitnessed fall EXAM: CT HEAD WITHOUT CONTRAST TECHNIQUE: Contiguous axial images were obtained from the base of the skull through the vertex without intravenous  contrast. COMPARISON:  February 25, 2019 FINDINGS: Brain: No evidence of acute territorial infarction, hemorrhage, hydrocephalus,extra-axial collection or mass lesion/mass effect. There is mild dilatation the ventricles and sulci consistent with age-related atrophy. Low-attenuation changes in the deep white matter consistent with small vessel ischemia. Vascular: No hyperdense vessel or unexpected calcification. Skull: The skull is intact. No fracture or focal lesion identified. Sinuses/Orbits: The visualized paranasal sinuses and mastoid air cells are clear. The orbits and globes intact. Other: None IMPRESSION: No acute intracranial abnormality. Findings consistent with age related atrophy and chronic small vessel ischemia Electronically Signed   By: Prudencio Pair M.D.   On: 03/26/2019 22:36     ASSESSMENT AND PLAN:   73 year old male with past medical history of diabetes, seizures, hypertension, hyperlipidemia, history of prostate cancer, anxiety, GERD, history of coronary disease to mitral valve regurgitation who presented to the hospital due to fever, fall and shortness of breath.  1.  Sepsis-patient met criteria on admission given his fever, tachycardia. -Source of sepsis remains unclear. - Chest x-ray is negative for acute pneumonia, urinalysis is negative, patient's COVID-19 test is negative. - MRSA PCR negative, will DC vancomycin, continue cefepime, Flagyl for now. -Await cultures.  2.  Pancytopenia- etiology unclear presently. - Transfused 1 unit of packed red blood cells today and will follow hemoglobin.  Iron studies were consistent with anemia of chronic disease. - Platelet count is also low but no acute need for transfusion.  Mildly leukopenic. -We will get hematology oncology consult.  3.  History of seizures-no acute seizure activity.  Continue phenobarbital.  4.  GERD-continue Protonix.  5.  Anxiety-continue Paxil, Xanax.  6.  Essential hypertension-continue Toprol.  7.   Neuropathy-continue gabapentin.  8.  Chronic Diastolic CHF - clinically not in CHF.  - cont. Lasix, Toprol.     All the records are reviewed and case discussed with Care Management/Social Worker. Management plans discussed with the patient, family and they are in agreement.  CODE STATUS: Full code  DVT Prophylaxis: Ted's & SCD's.   TOTAL TIME TAKING CARE OF THIS PATIENT: 30 minutes.   POSSIBLE D/C IN 2-3 DAYS, DEPENDING ON CLINICAL CONDITION.   Henreitta Leber M.D on 03/27/2019 at 1:50 PM  Between 7am to 6pm - Pager - [phone]  After 6pm go to [email or web address] - Proofreader  [facility]  Office  [phone]  CC: Primary care physician; Adin Hector, MD

## 2019-03-27 NOTE — Progress Notes (Signed)
MD notified of lactic acid level 2.3

## 2019-03-28 ENCOUNTER — Inpatient Hospital Stay: Payer: Medicare Other

## 2019-03-28 LAB — RETICULOCYTES
Immature Retic Fract: 24.7 % — ABNORMAL HIGH (ref 2.3–15.9)
RBC.: 3.25 MIL/uL — ABNORMAL LOW (ref 4.22–5.81)
Retic Count, Absolute: 54.3 10*3/uL (ref 19.0–186.0)
Retic Ct Pct: 1.7 % (ref 0.4–3.1)

## 2019-03-28 LAB — LACTATE DEHYDROGENASE: LDH: 315 U/L — ABNORMAL HIGH (ref 98–192)

## 2019-03-28 LAB — BASIC METABOLIC PANEL
Anion gap: 10 (ref 5–15)
BUN: 25 mg/dL — ABNORMAL HIGH (ref 8–23)
CO2: 23 mmol/L (ref 22–32)
Calcium: 8.3 mg/dL — ABNORMAL LOW (ref 8.9–10.3)
Chloride: 108 mmol/L (ref 98–111)
Creatinine, Ser: 1.22 mg/dL (ref 0.61–1.24)
GFR calc Af Amer: >60 mL/min (ref >60)
GFR calc non Af Amer: 58 mL/min — ABNORMAL LOW (ref >60)
Glucose, Bld: 152 mg/dL — ABNORMAL HIGH (ref 70–99)
Potassium: 3.9 mmol/L (ref 3.5–5.1)
Sodium: 141 mmol/L (ref 135–145)

## 2019-03-28 LAB — TYPE AND SCREEN
ABO/RH(D): O POS
Antibody Screen: NEGATIVE
Unit division: 0

## 2019-03-28 LAB — BPAM RBC
Blood Product Expiration Date: 202011032359
ISSUE DATE / TIME: 202010080936
Unit Type and Rh: 5100

## 2019-03-28 LAB — GLUCOSE, CAPILLARY
Glucose-Capillary: 154 mg/dL — ABNORMAL HIGH (ref 70–99)
Glucose-Capillary: 238 mg/dL — ABNORMAL HIGH (ref 70–99)
Glucose-Capillary: 114 mg/dL — ABNORMAL HIGH (ref 70–99)
Glucose-Capillary: 238 mg/dL — ABNORMAL HIGH (ref 70–99)

## 2019-03-28 LAB — CBC
HCT: 23.9 % — ABNORMAL LOW (ref 39.0–52.0)
Hemoglobin: 7.5 g/dL — ABNORMAL LOW (ref 13.0–17.0)
MCH: 23.1 pg — ABNORMAL LOW (ref 26.0–34.0)
MCHC: 31.4 g/dL (ref 30.0–36.0)
MCV: 73.5 fL — ABNORMAL LOW (ref 80.0–100.0)
Platelets: 108 10*3/uL — ABNORMAL LOW (ref 150–400)
RBC: 3.25 MIL/uL — ABNORMAL LOW (ref 4.22–5.81)
RDW: 21.5 % — ABNORMAL HIGH (ref 11.5–15.5)
WBC: 2.8 10*3/uL — ABNORMAL LOW (ref 4.0–10.5)
nRBC: 1.8 % — ABNORMAL HIGH (ref 0.0–0.2)

## 2019-03-28 LAB — SEDIMENTATION RATE: Sed Rate: 129 mm/hr — ABNORMAL HIGH (ref 0–20)

## 2019-03-28 LAB — HEPATITIS B CORE ANTIBODY, TOTAL: Hep B Core Total Ab: NONREACTIVE

## 2019-03-28 LAB — HEPATITIS C ANTIBODY: HCV Ab: NONREACTIVE

## 2019-03-28 LAB — DAT, POLYSPECIFIC AHG (ARMC ONLY): Polyspecific AHG test: NEGATIVE

## 2019-03-28 MED ORDER — IOHEXOL 9 MG/ML PO SOLN
500.0000 mL | ORAL | Status: AC
  Administered 2019-03-28 (×2): 500 mL via ORAL

## 2019-03-28 MED ORDER — LORAZEPAM 2 MG/ML IJ SOLN
1.0000 mg | Freq: Once | INTRAMUSCULAR | Status: AC
  Administered 2019-03-28: 1 mg via INTRAVENOUS

## 2019-03-28 MED ORDER — LORAZEPAM 2 MG/ML IJ SOLN
INTRAMUSCULAR | Status: AC
  Filled 2019-03-28: qty 1

## 2019-03-28 MED ORDER — IPRATROPIUM-ALBUTEROL 0.5-2.5 (3) MG/3ML IN SOLN
3.0000 mL | Freq: Two times a day (BID) | RESPIRATORY_TRACT | Status: DC
  Filled 2019-03-28 (×2): qty 3

## 2019-03-28 MED ORDER — POLYETHYLENE GLYCOL 3350 17 G PO PACK
17.0000 g | PACK | Freq: Every day | ORAL | Status: DC | PRN
  Administered 2019-03-28: 08:00:00 17 g via ORAL
  Filled 2019-03-28 (×2): qty 1

## 2019-03-28 MED ORDER — DOCUSATE SODIUM 100 MG PO CAPS
100.0000 mg | ORAL_CAPSULE | Freq: Two times a day (BID) | ORAL | Status: DC | PRN
  Filled 2019-03-28 (×2): qty 1

## 2019-03-28 NOTE — Progress Notes (Signed)
PT Cancellation Note  Patient Details Name: CHRISTIN JONES MRN: [id number] DOB: 1945-07-05   Cancelled Treatment:    Reason Eval/Treat Not Completed: Patient declined, no reason specified(Patient states he doesn't feel like participating. Explained importance of mobility and role of PT in the acute care setting. Paient continued to refuse to participate. States he has 10/10 pain in rectum. Offered to contact nurse for pain medication. Patient states she already did. Informed nursing of pain. Set up chair with alarm for future attempts. Will re-attempt at later time/date if medically appropriate and willing to participate.)  Everlean Alstrom. Graylon Good, PT, DPT 03/28/19, 9:14 AM

## 2019-03-28 NOTE — Progress Notes (Signed)
Pharmacy Antibiotic Note  Mike [name] is a 73 y.o. male admitted on 03/26/2019 with fever s/p fall has a h/o prostate CA s/p prostatectomy meeting 4/4 SIRS currently febrile w/ Tmax 102.55F, neutropenic at 3.6 >> 2.8 w/ ANC of 0.8, LA 2.2, CXR negative, CT head negative, UA non-diagnostic.  Pharmacy has been consulted for cefepime dosing.  Vancomycin discontinued 10/8 Oncology seeing  Plan: Day 3- Will continue cefepime 2g IV q12h per CrCl currently of 30 - 60 ml/min and flagyl 500 mg IV q8h and will continue to monitor x s/sx of infx and renal function.   Height: 5\' 7"  (170.2 cm) Weight: 198 lb 6.6 oz (90 kg) IBW/kg (Calculated) : 66.1  Temp (24hrs), Avg:99.1 F (37.3 C), Min:98.4 F (36.9 C), Max:100.4 F (38 C)  Recent Labs  Lab 03/26/19 2012 03/27/19 0255 03/28/19 0543  WBC 2.8* 2.9* 2.8*  CREATININE 1.43* 1.30* 1.22  LATICACIDVEN 2.2* 2.3*  --   VANCORANDOM  --  22  --     Estimated Creatinine Clearance: 56.9 mL/min (by C-G formula based on SCr of 1.22 mg/dL).    Allergies  Allergen Reactions  . Naproxen Sodium Other (See Comments)    Reaction: Upset stomach  . Nsaids Other (See Comments)    Other reaction(s): Unknown  . Aleve [Naproxen] Nausea And Vomiting  . Ibuprofen Nausea Only    Reaction: Upset stomach     Thank you for allowing pharmacy to be a part of this patient's care.  Chinita Greenland PharmD Clinical Pharmacist 03/28/2019

## 2019-03-28 NOTE — Progress Notes (Signed)
[place] at [place] NAME: Mike [name]    MR#:  [id number]  DATE OF BIRTH:  Jul 08, 1945  SUBJECTIVE:   Patient still complaining of pain in his lower back and pelvis and rectal area.  No evidence of rectal bleeding.  Hemoglobin remained stable.  No other acute events overnight.  Patient's sister is at bedside.  REVIEW OF SYSTEMS:    Review of Systems  Constitutional: Negative for chills and fever.  HENT: Negative for congestion and tinnitus.   Eyes: Negative for blurred vision and double vision.  Respiratory: Negative for cough, shortness of breath and wheezing.   Cardiovascular: Negative for chest pain, orthopnea and PND.  Gastrointestinal: Negative for abdominal pain, diarrhea, nausea and vomiting.  Genitourinary: Negative for dysuria and hematuria.  Musculoskeletal: Positive for falls.  Neurological: Positive for weakness (generalized). Negative for dizziness, sensory change and focal weakness.  All other systems reviewed and are negative.   Nutrition: Heart Healthy/Carb control Tolerating Diet: Yes Tolerating PT: Await Eval.    DRUG ALLERGIES:   Allergies  Allergen Reactions  . Naproxen Sodium Other (See Comments)    Reaction: Upset stomach  . Nsaids Other (See Comments)    Other reaction(s): Unknown  . Aleve [Naproxen] Nausea And Vomiting  . Ibuprofen Nausea Only    Reaction: Upset stomach     VITALS:  Blood pressure (!) 136/55, pulse 70, temperature (!) 100.4 F (38 C), temperature source Oral, resp. rate 20, height '5\' 7"'  (1.702 m), weight 90 kg, SpO2 96 %.  PHYSICAL EXAMINATION:   Physical Exam  GENERAL:  73 y.o.-year-old obese patient lying in bed in no acute distress.  EYES: Pupils equal, round, reactive to light and accommodation. No scleral icterus. Extraocular muscles intact.  HEENT: Head atraumatic, normocephalic. Oropharynx and nasopharynx clear.  NECK:  Supple, no jugular venous distention. No thyroid  enlargement, no tenderness.  LUNGS: Normal breath sounds bilaterally, no wheezing, rales, rhonchi. No use of accessory muscles of respiration.  CARDIOVASCULAR: S1, S2 normal. No murmurs, rubs, or gallops.  ABDOMEN: Soft, nontender, nondistended. Bowel sounds present. No organomegaly or mass.  EXTREMITIES: No cyanosis, clubbing or edema b/l.    NEUROLOGIC: Cranial nerves II through XII are intact. No focal Motor or sensory deficits b/l.  Globally weak.  PSYCHIATRIC: The patient is alert and oriented x 3.  SKIN: No obvious rash, lesion, or ulcer.   Rectum - Redness around the per-rectal and area.    LABORATORY PANEL:   CBC Recent Labs  Lab 03/28/19 0543  WBC 2.8*  HGB 7.5*  HCT 23.9*  PLT 108*   ------------------------------------------------------------------------------------------------------------------  Chemistries  Recent Labs  Lab 03/26/19 2012  03/28/19 0543  NA 138   < > 141  K 4.4   < > 3.9  CL 100   < > 108  CO2 27   < > 23  GLUCOSE 197*   < > 152*  BUN 30*   < > 25*  CREATININE 1.43*   < > 1.22  CALCIUM 9.5   < > 8.3*  AST 20  --   --   ALT 14  --   --   ALKPHOS 116  --   --   BILITOT 0.9  --   --    < > = values in this interval not displayed.   ------------------------------------------------------------------------------------------------------------------  Cardiac Enzymes No results for input(s): TROPONINI in the last 168 hours. ------------------------------------------------------------------------------------------------------------------  RADIOLOGY:  Dg Chest 1 View  Result Date: 03/26/2019 CLINICAL DATA:  UNWITNESSED FALL EXAM: CHEST  1 VIEW COMPARISON:  January 07, 2018 FINDINGS: There is mild cardiomegaly. Overlying median sternotomy wires. Both lungs are clear. 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 IMPRESSION: No acute cardiopulmonary process. Electronically Signed   By: Prudencio Pair M.D.   On: 03/26/2019 21:03   Ct Head Wo Contrast  Result  Date: 03/26/2019 CLINICAL DATA:  Head trauma, unwitnessed fall EXAM: CT HEAD WITHOUT CONTRAST TECHNIQUE: Contiguous axial images were obtained from the base of the skull through the vertex without intravenous contrast. COMPARISON:  February 25, 2019 FINDINGS: Brain: No evidence of acute territorial infarction, hemorrhage, hydrocephalus,extra-axial collection or mass lesion/mass effect. There is mild dilatation the ventricles and sulci consistent with age-related atrophy. Low-attenuation changes in the deep white matter consistent with small vessel ischemia. Vascular: No hyperdense vessel or unexpected calcification. Skull: The skull is intact. No fracture or focal lesion identified. Sinuses/Orbits: The visualized paranasal sinuses and mastoid air cells are clear. The orbits and globes intact. Other: None IMPRESSION: No acute intracranial abnormality. Findings consistent with age related atrophy and chronic small vessel ischemia Electronically Signed   By: Prudencio Pair M.D.   On: 03/26/2019 22:36     ASSESSMENT AND PLAN:   73 year old male with past medical history of diabetes, seizures, hypertension, hyperlipidemia, history of prostate cancer, anxiety, GERD, history of coronary disease to mitral valve regurgitation who presented to the hospital due to fever, fall and shortness of breath.  1.  Sepsis-patient met criteria on admission given his fever, tachycardia. -Source of sepsis remains unclear. - Chest x-ray is negative for acute pneumonia, urinalysis is negative, patient's COVID-19 test is negative. - MRSA PCR negative and off, continue cefepime, Flagyl for now. -Await cultures which remain negative so far.   2.  Pancytopenia- etiology unclear presently. -Seen by hematology oncology and as per them patient has a baseline microcytic anemia now has poor reserves and therefore possibly could be underlying iron deficiency. -No evidence of hemolysis based on blood work.  No plans for bone marrow biopsy  presently. -Continue supportive care with transfusions as needed.  Follow serial counts.  3. Abdominal/Rectal/Lower back pain -patient had a fall prior to admission.  Patient complaining of vague rectal pain/lower back/pelvis pain.  Will get X-ray of Lumbar spine, Pelvis.  - will also get CT abdomen/pelvis.   4.  History of seizures- no acute seizure activity.  Continue phenobarbital.  5.  GERD- continue Protonix.  6.  Anxiety- continue Paxil, Xanax.  7.  Essential hypertension- continue Toprol.  8.  Neuropathy- continue gabapentin.  9.  Chronic Diastolic CHF - clinically not in CHF.  - cont. Lasix, Toprol.    Await PT eval. Pt. Does not want to go to [facility] and social work made aware.   All the records are reviewed and case discussed with Care Management/Social Worker. Management plans discussed with the patient, family and they are in agreement.  CODE STATUS: Full code  DVT Prophylaxis: Ted's & SCD's.   TOTAL TIME TAKING CARE OF THIS PATIENT: 30 minutes.   POSSIBLE D/C IN 2-3 DAYS, DEPENDING ON CLINICAL CONDITION.   Henreitta Leber M.D on 03/28/2019 at 2:09 PM  Between 7am to 6pm - Pager - [phone]  After 6pm go to [email or web address] - Proofreader  [facility]  Office  [phone]  CC: Primary care physician; Adin Hector, MD

## 2019-03-28 NOTE — Progress Notes (Signed)
PT Cancellation Note  Patient Details Name: Mike [name] MRN: [id number] DOB: [birth year]   Cancelled Treatment:    Reason Eval/Treat Not Completed: Patient declined, no reason specified(Patient states he will not participate at this time. Explained to him the role of PT in his care as well as in the acute care setting. Nursing present and also strongly encouraged him to participate. He repeatedly refused participation in PT.)  Will attempt again at a later time/date if patient is medically appropriate.    Everlean Alstrom. Graylon Good, PT, DPT 03/28/19, 2:14 PM

## 2019-03-29 DIAGNOSIS — R339 Retention of urine, unspecified: Secondary | ICD-10-CM

## 2019-03-29 DIAGNOSIS — D509 Iron deficiency anemia, unspecified: Secondary | ICD-10-CM

## 2019-03-29 DIAGNOSIS — D696 Thrombocytopenia, unspecified: Secondary | ICD-10-CM

## 2019-03-29 DIAGNOSIS — D72819 Decreased white blood cell count, unspecified: Secondary | ICD-10-CM

## 2019-03-29 DIAGNOSIS — K6289 Other specified diseases of anus and rectum: Secondary | ICD-10-CM

## 2019-03-29 LAB — BASIC METABOLIC PANEL
Anion gap: 7 (ref 5–15)
BUN: 21 mg/dL (ref 8–23)
CO2: 23 mmol/L (ref 22–32)
Calcium: 8.1 mg/dL — ABNORMAL LOW (ref 8.9–10.3)
Chloride: 106 mmol/L (ref 98–111)
Creatinine, Ser: 0.99 mg/dL (ref 0.61–1.24)
GFR calc Af Amer: >60 mL/min (ref >60)
GFR calc non Af Amer: >60 mL/min (ref >60)
Glucose, Bld: 203 mg/dL — ABNORMAL HIGH (ref 70–99)
Potassium: 3.9 mmol/L (ref 3.5–5.1)
Sodium: 136 mmol/L (ref 135–145)

## 2019-03-29 LAB — ANA W/REFLEX: Anti Nuclear Antibody (ANA): NEGATIVE

## 2019-03-29 LAB — GLUCOSE, CAPILLARY
Glucose-Capillary: 202 mg/dL — ABNORMAL HIGH (ref 70–99)
Glucose-Capillary: 303 mg/dL — ABNORMAL HIGH (ref 70–99)
Glucose-Capillary: 201 mg/dL — ABNORMAL HIGH (ref 70–99)
Glucose-Capillary: 193 mg/dL — ABNORMAL HIGH (ref 70–99)

## 2019-03-29 LAB — CBC
HCT: 23.7 % — ABNORMAL LOW (ref 39.0–52.0)
Hemoglobin: 7.5 g/dL — ABNORMAL LOW (ref 13.0–17.0)
MCH: 22.9 pg — ABNORMAL LOW (ref 26.0–34.0)
MCHC: 31.6 g/dL (ref 30.0–36.0)
MCV: 72.5 fL — ABNORMAL LOW (ref 80.0–100.0)
Platelets: 105 10*3/uL — ABNORMAL LOW (ref 150–400)
RBC: 3.27 MIL/uL — ABNORMAL LOW (ref 4.22–5.81)
RDW: 21.6 % — ABNORMAL HIGH (ref 11.5–15.5)
WBC: 3 10*3/uL — ABNORMAL LOW (ref 4.0–10.5)
nRBC: 1.7 % — ABNORMAL HIGH (ref 0.0–0.2)

## 2019-03-29 LAB — HAPTOGLOBIN: Haptoglobin: 355 mg/dL (ref 34–355)

## 2019-03-29 MED ORDER — METRONIDAZOLE 500 MG PO TABS: 500.0000 mg | ORAL_TABLET | Freq: Three times a day (TID) | ORAL | Status: DC

## 2019-03-29 MED ORDER — TAMSULOSIN HCL 0.4 MG PO CAPS
0.4000 mg | ORAL_CAPSULE | Freq: Every day | ORAL | Status: DC
  Administered 2019-03-29 – 2019-04-09 (×12): 0.4 mg via ORAL
  Filled 2019-03-29 (×12): qty 1

## 2019-03-29 MED ORDER — SODIUM CHLORIDE 0.9 % IV SOLN
2.0000 g | Freq: Three times a day (TID) | INTRAVENOUS | Status: DC
  Filled 2019-03-29 (×2): qty 2

## 2019-03-29 NOTE — Progress Notes (Signed)
At 0830 Mews score 3.  pt's HR 135, BP 174/95, temp 100.  Pt restless and c/o low back pain. Metoprolol po given. Notified Dr Stark Jock of the above.  Per MD to scan bladder and In&out cath.  1585ml urine removed via In&out cath.  Pt is much calmer now and denies pain.

## 2019-03-29 NOTE — Progress Notes (Signed)
Pt voided, but unable to empty bladder.  Post void bladder scan sowed 875ml.  Foley cath inserted per MD order.  1358ml urine output via foley.

## 2019-03-29 NOTE — Progress Notes (Signed)
[facility] NAME: Mike [name]    MR#:  [id number]  DATE OF BIRTH:  1946/05/19  SUBJECTIVE:   This morning patient still complaining of abdominal pains.  Reviewed CT scan report which revealed distended bladder.  In and out catheterization done revealed about 1.5 L.  Patient reported to have been voiding intermittently overnight.  Appears to be incomplete emptying of bladder.  Got some relief from abdominal pain.  No fevers.   REVIEW OF SYSTEMS:    Review of Systems  Constitutional: Negative for chills and fever.  HENT: Negative for congestion and tinnitus.   Eyes: Negative for blurred vision and double vision.  Respiratory: Negative for cough, shortness of breath and wheezing.   Cardiovascular: Negative for chest pain, orthopnea and PND.  Gastrointestinal: Negative for abdominal pain, diarrhea, nausea and vomiting.  Genitourinary: Negative for dysuria and hematuria.  Musculoskeletal: Negative for falls.  Neurological: Positive for weakness (generalized). Negative for dizziness, sensory change and focal weakness.  All other systems reviewed and are negative.   Nutrition: Heart Healthy/Carb control Tolerating Diet: Yes Tolerating PT: Await Eval.    DRUG ALLERGIES:   Allergies  Allergen Reactions  . Naproxen Sodium Other (See Comments)    Reaction: Upset stomach  . Nsaids Other (See Comments)    Other reaction(s): Unknown  . Aleve [Naproxen] Nausea And Vomiting  . Ibuprofen Nausea Only    Reaction: Upset stomach     VITALS:  Blood pressure 140/70, pulse 77, temperature 98.7 F (37.1 C), resp. rate 20, height '5\' 7"'  (1.702 m), weight 90 kg, SpO2 97 %.  PHYSICAL EXAMINATION:   Physical Exam  GENERAL:  73 y.o.-year-old obese patient lying in bed in no acute distress.  EYES: Pupils equal, round, reactive to light and accommodation. No scleral icterus. Extraocular muscles intact.  HEENT: Head atraumatic,  normocephalic. Oropharynx and nasopharynx clear.  NECK:  Supple, no jugular venous distention. No thyroid enlargement, no tenderness.  LUNGS: Normal breath sounds bilaterally, no wheezing, rales, rhonchi. No use of accessory muscles of respiration.  CARDIOVASCULAR: S1, S2 normal. No murmurs, rubs, or gallops.  ABDOMEN: Soft, nontender, nondistended. Bowel sounds present. No organomegaly or mass.  EXTREMITIES: No cyanosis, clubbing or edema b/l.    NEUROLOGIC: Cranial nerves II through XII are intact. No focal Motor or sensory deficits b/l.  Globally weak.  PSYCHIATRIC: The patient is alert and oriented x 3.  SKIN: No obvious rash, lesion, or ulcer.   Rectum - Redness around the per-rectal and area.    LABORATORY PANEL:   CBC Recent Labs  Lab 03/29/19 0448  WBC 3.0*  HGB 7.5*  HCT 23.7*  PLT 105*   ------------------------------------------------------------------------------------------------------------------  Chemistries  Recent Labs  Lab 03/26/19 2012  03/29/19 0448  NA 138   < > 136  K 4.4   < > 3.9  CL 100   < > 106  CO2 27   < > 23  GLUCOSE 197*   < > 203*  BUN 30*   < > 21  CREATININE 1.43*   < > 0.99  CALCIUM 9.5   < > 8.1*  AST 20  --   --   ALT 14  --   --   ALKPHOS 116  --   --   BILITOT 0.9  --   --    < > = values in this interval not displayed.   ------------------------------------------------------------------------------------------------------------------  Cardiac Enzymes No results for input(s):  TROPONINI in the last 168 hours. ------------------------------------------------------------------------------------------------------------------  RADIOLOGY:  Ct Abdomen Pelvis Wo Contrast  Result Date: 03/28/2019 CLINICAL DATA:  Abdominal pain and fever.  Rule out abscess. EXAM: CT ABDOMEN AND PELVIS WITHOUT CONTRAST TECHNIQUE: Multidetector CT imaging of the abdomen and pelvis was performed following the standard protocol without IV contrast.  COMPARISON:  None. FINDINGS: Lower chest: Lung bases clear bilaterally. Hepatobiliary: No focal liver abnormality is seen. No gallstones, gallbladder wall thickening, or biliary dilatation. Pancreas: Negative Spleen: Negative Adrenals/Urinary Tract: No renal calculi. No renal mass. Mild fullness of the renal collecting system due to markedly distended urinary bladder. Prior prostatectomy. No bladder mass. Stomach/Bowel: Normal stomach and duodenum. Negative for bowel obstruction. No bowel mass or edema. Appendix not visualized. Vascular/Lymphatic: Negative Reproductive: Prostatectomy.  No pelvic mass Other: Negative for abscess Musculoskeletal: Mild degenerative changes in the lumbar spine. No acute skeletal abnormality. IMPRESSION: Markedly distended urinary bladder. Prior prostatectomy for cancer. No mass lesion in the pelvis. Negative for abscess or source of infection. Electronically Signed   By: Franchot Gallo M.D.   On: 03/28/2019 16:53   Dg Lumbar Spine 2-3 Views  Result Date: 03/28/2019 CLINICAL DATA:  Recent fall at home. Pt does not remember falling. No known previous injury or surgery to pelvis or lumbar spine. EXAM: LUMBAR SPINE - 2-3 VIEW COMPARISON:  None. FINDINGS: There is no evidence of lumbar spine fracture. Alignment is normal. Intervertebral disc spaces are maintained. Flowing anterior osteophytosis. Gaseous distension of bowel loops throughout the abdomen. No acute finding in the visualized pelvis. SI joints are open. IMPRESSION: No acute osseous abnormality in the lumbar spine Electronically Signed   By: Audie Pinto M.D.   On: 03/28/2019 17:05   Dg Pelvis 1-2 Views  Result Date: 03/28/2019 CLINICAL DATA:  Recent fall at home. Pt does not remember falling. No known previous injury or surgery to pelvis or lumbar spine. EXAM: PELVIS - 1-2 VIEW COMPARISON:  Pelvis radiographs 03/23/2010 FINDINGS: There is no evidence of pelvic fracture or diastasis. Hips appear normally aligned. No  focal bony lesion. Bilateral postoperative clips. No pelvic bone lesions are seen. Regional soft tissues unremarkable. IMPRESSION: Negative pelvis radiographs. Electronically Signed   By: Audie Pinto M.D.   On: 03/28/2019 17:07     ASSESSMENT AND PLAN:   73 year old male with past medical history of diabetes, seizures, hypertension, hyperlipidemia, history of prostate cancer, anxiety, GERD, history of coronary disease to mitral valve regurgitation who presented to the hospital due to fever, fall and shortness of breath.  1.  Sepsis-patient met criteria on admission given his fever, tachycardia. -Source of sepsis remains unclear. - Chest x-ray is negative for acute pneumonia, urinalysis is negative, patient's COVID-19 test is negative. - MRSA PCR negative and off, continue cefepime, Flagyl for now.  Since no source of infectious process so far on cultures negative will hold off on antibiotics for now.  2.  Pancytopenia- etiology unclear presently. Stable -Seen by hematology oncology and as per them patient has a baseline microcytic anemia now has poor reserves and therefore possibly could be underlying iron deficiency. -No evidence of hemolysis based on blood work.  No plans for bone marrow biopsy presently. -Continue supportive care with transfusions as needed.  Follow serial counts.  3. Abdominal/Rectal/Lower back pain -patient had a fall prior to admission.   Due to complaints of abdominal pains yesterday patient had CT abdomen and pelvis done with no acute findings apart from distended urinary bladder which is being managed as outlined  below.  X-ray of the lumbar spine and pelvis negative for any acute findings.  4.  History of seizures- no acute seizure activity.  Continue phenobarbital.  5.  GERD- continue Protonix.  6.  Anxiety- continue Paxil, Xanax.  7.  Essential hypertension- continue Toprol.  8.  Neuropathy- continue gabapentin.  9.  Chronic Diastolic CHF - clinically  not in CHF.  - cont. Lasix, Toprol.    10.  Urinary retention Noted distended urinary bladder on CT scan.  In and out catheterization done this morning with about 1.5 L of urine output. Nursing staff reported patient had been voiding intermittently on his own. Initiated Flomax.  If recurrent retention will get urology consultation Physical therapy working with patient.  Recommended skilled nursing facility placement on discharge.  Pt. Does not want to go to [facility] and social work made aware.   DVT prophylaxis; SCDs No heparin products due to thrombocytopenia  All the records are reviewed and case discussed with Care Management/Social Worker. Management plans discussed with the patient, family and they are in agreement.  CODE STATUS: Full code  DVT Prophylaxis: Ted's & SCD's.   TOTAL TIME TAKING CARE OF THIS PATIENT: 30 minutes.   POSSIBLE D/C IN 2 DAYS, DEPENDING ON CLINICAL CONDITION.   Allaya Abbasi M.D on 03/29/2019 at 1:47 PM  Between 7am to 6pm - Pager - [phone]  After 6pm go to [email or web address] - Proofreader  [facility]  Office  [phone]  CC: Primary care physician; Adin Hector, MD
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
## 2019-03-29 NOTE — Progress Notes (Signed)
[facility] Hematology/Oncology Progress Note  Date of admission: 03/26/2019  Hospital day:  03/29/2019  Chief Complaint: Mike [name] is a 73 y.o. male with a history of prostate cancer, diabetes, and a seizure disorder who was admitted with presumed sepsis.  Subjective:  He notes severe rectal pain and difficulty urinating requiring in/out catheterization.  Diet is poor.  No melena or hematochezia.  Social History: The patient is accompanied by his sister, Mike [name], today.  Allergies:  Allergies  Allergen Reactions  . Naproxen Sodium Other (See Comments)    Reaction: Upset stomach  . Nsaids Other (See Comments)    Other reaction(s): Unknown  . Aleve [Naproxen] Nausea And Vomiting  . Ibuprofen Nausea Only    Reaction: Upset stomach     Scheduled Medications: . ALPRAZolam  0.5-1 mg Oral BID  . aspirin EC  81 mg Oral Daily  . atorvastatin  20 mg Oral Daily  . cholecalciferol  1,000 Units Oral Daily  . furosemide  40 mg Oral Daily  . gabapentin  1,200 mg Oral QHS  . gabapentin  300 mg Oral Daily  . insulin aspart  0-5 Units Subcutaneous QHS  . insulin aspart  0-9 Units Subcutaneous TID WC  . ipratropium-albuterol  3 mL Nebulization BID  . metoprolol succinate  50 mg Oral BID  . omega-3 acid ethyl esters  1,000 mg Oral Daily  . pantoprazole  40 mg Oral Daily  . PARoxetine  20 mg Oral Daily  . PHENobarbital  194.4 mg Oral Daily  . sodium chloride flush  10-40 mL Intracatheter Q12H  . tamsulosin  0.4 mg Oral Daily  . vitamin B-12  2,000 mcg Oral Daily    Review of Systems: Provided by the patient's sister and additional information provided by the patient. GENERAL:  Feels "bad".  No fevers, sweats or weight loss. PERFORMANCE STATUS (ECOG):  2 HEENT:  No visual changes, runny nose, sore throat, mouth sores or tenderness. Lungs: No shortness of breath or cough.  No hemoptysis. Cardiac:  No chest pain, palpitations, orthopnea, or PND. GI:  No nausea,  vomiting, diarrhea, constipation, melena or hematochezia. GU:  Difficulty voiding.  Required in/out catheterization (output > 1 liter).  No urgency, frequency, dysuria, or hematuria. Musculoskeletal:  Tail bone/rectal pain.  No joint pain.  No muscle tenderness. Extremities:  No pain or swelling. Skin:  No rashes or skin changes. Neuro:  No headache, numbness or weakness, balance or coordination issues. Endocrine:  No diabetes, thyroid issues, hot flashes or night sweats. Psych:  No mood changes, depression or anxiety. Pain:  Rectal/tail bone pain. Review of systems:  All other systems reviewed and found to be negative.  Physical Exam: Blood pressure 131/61, pulse 66, temperature 98.9 F (37.2 C), temperature source Oral, resp. rate 19, height 5\' 7"  (1.702 m), weight 198 lb 6.6 oz (90 kg), SpO2 99 %.  GENERAL:  Chronically fatigued gentleman sitting in a bedside recliner intermittently screaming. MENTAL STATUS:  Alert and oriented to person, place and time. HEAD:  Short gray hair.  Normocephalic, atraumatic, face symmetric, no Cushingoid features. EYES:  Dark rimmed glasses.  Blue yes.  Pupils equal round and reactive to light and accomodation.  No conjunctivitis or scleral icterus. ENT:  Oropharynx clear without lesion.  Tongue normal. Mucous membranes moist.  RESPIRATORY:  Clear to auscultation without rales, wheezes or rhonchi. CARDIOVASCULAR:  Regular rate and rhythm without murmur, rub or gallop. ABDOMEN:  Soft, fully round, non-tender, with active bowel sounds, and  no appreciable hepatosplenomegaly.  No masses. SKIN:  No rashes, ulcers or lesions. EXTREMITIES: No edema, no skin discoloration or tenderness.  No palpable cords. NEUROLOGICAL: Unremarkable. PSYCH:  Appropriate.  More interactive today.   Results for orders placed or performed during the hospital encounter of 03/26/19 (from the past 48 hour(s))  Glucose, capillary     Status: Abnormal   Collection Time: 03/27/19  4:50  PM  Result Value Ref Range   Glucose-Capillary 152 (H) 70 - 99 mg/dL  Glucose, capillary     Status: Abnormal   Collection Time: 03/27/19  9:51 PM  Result Value Ref Range   Glucose-Capillary 152 (H) 70 - 99 mg/dL  Basic metabolic panel     Status: Abnormal   Collection Time: 03/28/19  5:43 AM  Result Value Ref Range   Sodium 141 135 - 145 mmol/L   Potassium 3.9 3.5 - 5.1 mmol/L   Chloride 108 98 - 111 mmol/L   CO2 23 22 - 32 mmol/L   Glucose, Bld 152 (H) 70 - 99 mg/dL   BUN 25 (H) 8 - 23 mg/dL   Creatinine, Ser 1.22 0.61 - 1.24 mg/dL   Calcium 8.3 (L) 8.9 - 10.3 mg/dL   GFR calc non Af Amer 58 (L) >60 mL/min   GFR calc Af Amer >60 >60 mL/min   Anion gap 10 5 - 15    Comment: Performed at [facility] [address] [place], [place] [address]  CBC     Status: Abnormal   Collection Time: 03/28/19  5:43 AM  Result Value Ref Range   WBC 2.8 (L) 4.0 - 10.5 K/uL   RBC 3.25 (L) 4.22 - 5.81 MIL/uL   Hemoglobin 7.5 (L) 13.0 - 17.0 g/dL    Comment: Reticulocyte Hemoglobin testing may be clinically indicated, consider ordering this additional test [id number]    HCT 23.9 (L) 39.0 - 52.0 %   MCV 73.5 (L) 80.0 - 100.0 fL   MCH 23.1 (L) 26.0 - 34.0 pg   MCHC 31.4 30.0 - 36.0 g/dL   RDW 21.5 (H) 11.5 - 15.5 %   Platelets 108 (L) 150 - 400 K/uL    Comment: Immature Platelet Fraction may be clinically indicated, consider ordering this additional test [id number]    nRBC 1.8 (H) 0.0 - 0.2 %    Comment: Performed at [facility], [address] [place], [place] [address]  Reticulocytes     Status: Abnormal   Collection Time: 03/28/19  5:43 AM  Result Value Ref Range   Retic Ct Pct 1.7 0.4 - 3.1 %   RBC. 3.25 (L) 4.22 - 5.81 MIL/uL   Retic Count, Absolute 54.3 19.0 - 186.0 K/uL   Immature Retic Fract 24.7 (H) 2.3 - 15.9 %    Comment: Performed at [facility], [address] [place], [place] [address]  Lactate dehydrogenase     Status: Abnormal    Collection Time: 03/28/19  5:43 AM  Result Value Ref Range   LDH 315 (H) 98 - 192 U/L    Comment: Performed at [facility], [address] [place], [place] [address]  Haptoglobin     Status: None   Collection Time: 03/28/19  5:43 AM  Result Value Ref Range   Haptoglobin 355 34 - 355 mg/dL    Comment: (NOTE) Performed At: [facility] [place], [place] [id number] Rush Farmer MD [id number]   DAT, polyspecific, AHG ([place] only)     Status:  None   Collection Time: 03/28/19  5:43 AM  Result Value Ref Range   Polyspecific AHG test      NEG Performed at [facility], [address] [place], [place] [address]   Sedimentation rate     Status: Abnormal   Collection Time: 03/28/19  5:43 AM  Result Value Ref Range   Sed Rate 129 (H) 0 - 20 mm/hr    Comment: Performed at [facility], [address] [place], [place] [address]  ANA w/Reflex     Status: None   Collection Time: 03/28/19  5:43 AM  Result Value Ref Range   Anti Nuclear Antibody (ANA) Negative Negative    Comment: (NOTE) Performed At: [facility] [place], [place] [id number] Rush Farmer MD [id number]   Hepatitis B core antibody, total     Status: None   Collection Time: 03/28/19  5:43 AM  Result Value Ref Range   Hep B Core Total Ab NON REACTIVE NON REACTIVE    Comment: Performed at [facility], [place] [address] [place], [place] [address]  Hepatitis C antibody     Status: None   Collection Time: 03/28/19  5:43 AM  Result Value Ref Range   HCV Ab NON REACTIVE NON REACTIVE    Comment: (NOTE) Nonreactive HCV antibody screen is consistent with no HCV infections,  unless recent infection is suspected or other evidence exists to indicate HCV infection. Performed at [facility], [place] [address] [place], [place] [address]   Glucose, capillary     Status: Abnormal   Collection Time: 03/28/19  7:32 AM  Result Value Ref Range    Glucose-Capillary 154 (H) 70 - 99 mg/dL  Glucose, capillary     Status: Abnormal   Collection Time: 03/28/19 12:10 PM  Result Value Ref Range   Glucose-Capillary 238 (H) 70 - 99 mg/dL  Glucose, capillary     Status: Abnormal   Collection Time: 03/28/19  5:18 PM  Result Value Ref Range   Glucose-Capillary 114 (H) 70 - 99 mg/dL  Glucose, capillary     Status: Abnormal   Collection Time: 03/28/19 10:26 PM  Result Value Ref Range   Glucose-Capillary 238 (H) 70 - 99 mg/dL  CBC     Status: Abnormal   Collection Time: 03/29/19  4:48 AM  Result Value Ref Range   WBC 3.0 (L) 4.0 - 10.5 K/uL   RBC 3.27 (L) 4.22 - 5.81 MIL/uL   Hemoglobin 7.5 (L) 13.0 - 17.0 g/dL    Comment: Reticulocyte Hemoglobin testing may be clinically indicated, consider ordering this additional test [id number]    HCT 23.7 (L) 39.0 - 52.0 %   MCV 72.5 (L) 80.0 - 100.0 fL   MCH 22.9 (L) 26.0 - 34.0 pg   MCHC 31.6 30.0 - 36.0 g/dL   RDW 21.6 (H) 11.5 - 15.5 %   Platelets 105 (L) 150 - 400 K/uL    Comment: Immature Platelet Fraction may be clinically indicated, consider ordering this additional test [id number]    nRBC 1.7 (H) 0.0 - 0.2 %    Comment: Performed at [facility], [address] [place], [place] [id number]  Basic metabolic panel     Status: Abnormal   Collection Time: 03/29/19  4:48 AM  Result Value Ref Range   Sodium 136 135 - 145 mmol/L   Potassium 3.9 3.5 - 5.1 mmol/L   Chloride 106 98 - 111 mmol/L   CO2 23  22 - 32 mmol/L   Glucose, Bld 203 (H) 70 - 99 mg/dL   BUN 21 8 - 23 mg/dL   Creatinine, Ser 0.99 0.61 - 1.24 mg/dL   Calcium 8.1 (L) 8.9 - 10.3 mg/dL   GFR calc non Af Amer >60 >60 mL/min   GFR calc Af Amer >60 >60 mL/min   Anion gap 7 5 - 15    Comment: Performed at [facility], [address] [place], [place] [address]  Glucose, capillary     Status: Abnormal   Collection Time: 03/29/19  7:42 AM  Result Value Ref Range   Glucose-Capillary 202 (H) 70 - 99 mg/dL   Glucose, capillary     Status: Abnormal   Collection Time: 03/29/19 11:56 AM  Result Value Ref Range   Glucose-Capillary 303 (H) 70 - 99 mg/dL   Ct Abdomen Pelvis Wo Contrast  Result Date: 03/28/2019 CLINICAL DATA:  Abdominal pain and fever.  Rule out abscess. EXAM: CT ABDOMEN AND PELVIS WITHOUT CONTRAST TECHNIQUE: Multidetector CT imaging of the abdomen and pelvis was performed following the standard protocol without IV contrast. COMPARISON:  None. FINDINGS: Lower chest: Lung bases clear bilaterally. Hepatobiliary: No focal liver abnormality is seen. No gallstones, gallbladder wall thickening, or biliary dilatation. Pancreas: Negative Spleen: Negative Adrenals/Urinary Tract: No renal calculi. No renal mass. Mild fullness of the renal collecting system due to markedly distended urinary bladder. Prior prostatectomy. No bladder mass. Stomach/Bowel: Normal stomach and duodenum. Negative for bowel obstruction. No bowel mass or edema. Appendix not visualized. Vascular/Lymphatic: Negative Reproductive: Prostatectomy.  No pelvic mass Other: Negative for abscess Musculoskeletal: Mild degenerative changes in the lumbar spine. No acute skeletal abnormality. IMPRESSION: Markedly distended urinary bladder. Prior prostatectomy for cancer. No mass lesion in the pelvis. Negative for abscess or source of infection. Electronically Signed   By: Franchot Gallo M.D.   On: 03/28/2019 16:53   Dg Lumbar Spine 2-3 Views  Result Date: 03/28/2019 CLINICAL DATA:  Recent fall at home. Pt does not remember falling. No known previous injury or surgery to pelvis or lumbar spine. EXAM: LUMBAR SPINE - 2-3 VIEW COMPARISON:  None. FINDINGS: There is no evidence of lumbar spine fracture. Alignment is normal. Intervertebral disc spaces are maintained. Flowing anterior osteophytosis. Gaseous distension of bowel loops throughout the abdomen. No acute finding in the visualized pelvis. SI joints are open. IMPRESSION: No acute osseous  abnormality in the lumbar spine Electronically Signed   By: Audie Pinto M.D.   On: 03/28/2019 17:05   Dg Pelvis 1-2 Views  Result Date: 03/28/2019 CLINICAL DATA:  Recent fall at home. Pt does not remember falling. No known previous injury or surgery to pelvis or lumbar spine. EXAM: PELVIS - 1-2 VIEW COMPARISON:  Pelvis radiographs 03/23/2010 FINDINGS: There is no evidence of pelvic fracture or diastasis. Hips appear normally aligned. No focal bony lesion. Bilateral postoperative clips. No pelvic bone lesions are seen. Regional soft tissues unremarkable. IMPRESSION: Negative pelvis radiographs. Electronically Signed   By: Audie Pinto M.D.   On: 03/28/2019 17:07    Assessment:  MALCOM SANANTONIO is a 73 y.o. male with with a seizure disorder who was admitted with presumed sepsis.  He has a history of a chronic microcytic anemia and intermittent leukopenia.   CBC on admission revealed a hematocrit of 25.5, hemoglobin 7.9, MCV 69.1, platelets 248,000, white count 2800 with an ANC 800.  Creatinine was 1.43.  Liver function tests were normal.  Lactic acid was 2.2.  Anemia work-up  has included a ferritin of 425 with an iron saturation of 9% and a TIBC of 255.  B12 was 2930.  Folate was 30.    Peripheral smear revealed a microcytic hypochromic anemia with anisopoikilocytosis.  There were teardrop cells and elliptocytes.  There was absolute leukopenia with relative lymphocytosis and a few large granular lymphocytes.  Absolute neutrophil count was 820.  Refills were unremarkable.  There was mild thrombocytopenia with normal platelet morphology.  There was no platelet clumping.  Abdomen and pelvic CT on 03/28/2019 revealed a markedly distended bladder.  There was no evidence of abscess.  Spleen was normal size.  Pelvis and lumbar plain films revealed no evidence of fracture.  Patient has a history of prostate cancer s/p radical prostatectomy in 2004.  He has B12 deficiency and has been on oral  B12.  Symptomatically, he complains of severe rectal pain.  Exam reveals a distended abdomen.    Plan:   1.  Microcytic anemia             Hematocrit 23.7.  Hemoglobin 7.5.  MCV 72.5 on 03/29/2019.  Hematocrit 23.9.  Hemoglobin 7.5.  MCV 73.5 on 03/28/2019.  Patient s/p 1 unit of PRBCs on 03/27/2019.  Patient has a chronic baseline microcytic anemia.     Patient's sister notes that another sister and niece have beta-thalassemia.   Follow-up pending hemoglobin electrophoresis.  Patient has acute on chronic microcytic anemia    He has no current evidence of bleeding.    Stool guaiacs ordered.   Ferritin 425 with an iron saturation of 9% (low) and a TIBC of 255.   Sed rate is 129 (high) and likely falsely elevates ferritin.   Consider trial of IV iron (Venofer 200 mg IV x 1).   Patient's diet is poor.   Consider GI elevation.    Patient's sister notes no GI evaluation since at least 2004.   No evidence of hemolysis    Coombs and haptoglobin normal.   Retic 1.7% (inapproriately low).   Maintain active type and screen. 2.   Thrombocytopenia and leukopenia  Platelets 105,000.  WBC 3000 on 03/29/2019.  Platelets 108,000.  WBC 2800 on 03/28/2019.  Normal studies to date:   ANA, hepatitis B and C serologies.             Unclear significance of large granular lymphocytes.   Flow cytometry pending.             Review of medications:                         Paxil (< 1% incidence of pancytopenia), phenobarbitol (undefined incidence of agranulocytosis, thrombocytopenia, and macrocytic anemia),                         Septra (leukopenia, thrombocytopenia, anemia)- patient's sister unsure when he last received.             Follow CBC daily.    Neutropenic precautions. 3.   Urinary retention  Patient s/p in/out catheterization with large volume urine (> 1 liter). 4.   Rectal/sacral pain  Etiology unclear.  No abnormalities noted on CT scan and plain films.  Consider GI  evaluation.   Lequita Asal, MD  03/29/2019, 4:21 PM

## 2019-03-30 ENCOUNTER — Inpatient Hospital Stay: Payer: Medicare Other

## 2019-03-30 DIAGNOSIS — D649 Anemia, unspecified: Secondary | ICD-10-CM

## 2019-03-30 LAB — CBC WITH DIFFERENTIAL/PLATELET
Abs Immature Granulocytes: 0.23 10*3/uL — ABNORMAL HIGH (ref 0.00–0.07)
Basophils Absolute: 0 10*3/uL (ref 0.0–0.1)
Basophils Relative: 1 %
Eosinophils Absolute: 0.1 10*3/uL (ref 0.0–0.5)
Eosinophils Relative: 3 %
HCT: 26.7 % — ABNORMAL LOW (ref 39.0–52.0)
Hemoglobin: 8.3 g/dL — ABNORMAL LOW (ref 13.0–17.0)
Immature Granulocytes: 7 %
Lymphocytes Relative: 43 %
Lymphs Abs: 1.3 10*3/uL (ref 0.7–4.0)
MCH: 23.3 pg — ABNORMAL LOW (ref 26.0–34.0)
MCHC: 31.1 g/dL (ref 30.0–36.0)
MCV: 75 fL — ABNORMAL LOW (ref 80.0–100.0)
Monocytes Absolute: 0.5 10*3/uL (ref 0.1–1.0)
Monocytes Relative: 17 %
Neutro Abs: 0.9 10*3/uL — ABNORMAL LOW (ref 1.7–7.7)
Neutrophils Relative %: 29 %
Platelets: 103 10*3/uL — ABNORMAL LOW (ref 150–400)
RBC: 3.56 MIL/uL — ABNORMAL LOW (ref 4.22–5.81)
RDW: 22.1 % — ABNORMAL HIGH (ref 11.5–15.5)
Smear Review: NORMAL
WBC: 3.1 10*3/uL — ABNORMAL LOW (ref 4.0–10.5)
nRBC: 1 % — ABNORMAL HIGH (ref 0.0–0.2)

## 2019-03-30 LAB — GLUCOSE, CAPILLARY
Glucose-Capillary: 191 mg/dL — ABNORMAL HIGH (ref 70–99)
Glucose-Capillary: 312 mg/dL — ABNORMAL HIGH (ref 70–99)
Glucose-Capillary: 286 mg/dL — ABNORMAL HIGH (ref 70–99)
Glucose-Capillary: 237 mg/dL — ABNORMAL HIGH (ref 70–99)

## 2019-03-30 LAB — URINALYSIS, ROUTINE W REFLEX MICROSCOPIC
Bilirubin Urine: NEGATIVE
Glucose, UA: >=500 mg/dL — AB
Ketones, ur: 20 mg/dL — AB
Nitrite: NEGATIVE
Protein, ur: 100 mg/dL — AB
Specific Gravity, Urine: 1.016 (ref 1.005–1.030)
Squamous Epithelial / HPF: NONE SEEN (ref 0–5)
pH: 5 (ref 5.0–8.0)

## 2019-03-30 LAB — PSA: Prostatic Specific Antigen: 0.92 ng/mL (ref 0.00–4.00)

## 2019-03-30 MED ORDER — SODIUM CHLORIDE 0.9 % IV SOLN
1.5000 g | Freq: Four times a day (QID) | INTRAVENOUS | Status: DC
  Administered 2019-03-30 (×2): 1.5 g via INTRAVENOUS
  Filled 2019-03-30 (×3): qty 4
  Filled 2019-03-30: qty 1.5
  Filled 2019-03-30: qty 4

## 2019-03-30 MED ORDER — BELLADONNA ALKALOIDS-OPIUM 16.2-60 MG RE SUPP
1.0000 | Freq: Three times a day (TID) | RECTAL | Status: DC | PRN
  Administered 2019-03-30 – 2019-03-31 (×4): 1 via RECTAL
  Filled 2019-03-30 (×5): qty 1

## 2019-03-30 MED ORDER — SODIUM CHLORIDE 0.9 % IV SOLN
2.0000 g | Freq: Three times a day (TID) | INTRAVENOUS | Status: DC
  Administered 2019-03-30 – 2019-03-31 (×2): 2 g via INTRAVENOUS
  Filled 2019-03-30 (×4): qty 2

## 2019-03-30 MED ORDER — IPRATROPIUM-ALBUTEROL 0.5-2.5 (3) MG/3ML IN SOLN: 3.0000 mL | Freq: Four times a day (QID) | RESPIRATORY_TRACT | Status: DC | PRN

## 2019-03-30 MED ORDER — CHLORHEXIDINE GLUCONATE CLOTH 2 % EX PADS
6.0000 | MEDICATED_PAD | Freq: Every day | CUTANEOUS | Status: DC
  Administered 2019-03-30 – 2019-04-09 (×12): 6 via TOPICAL

## 2019-03-30 MED ORDER — VANCOMYCIN HCL 10 G IV SOLR
1250.0000 mg | INTRAVENOUS | Status: DC
  Filled 2019-03-30: qty 1250

## 2019-03-30 MED ORDER — VANCOMYCIN HCL 10 G IV SOLR
2000.0000 mg | Freq: Once | INTRAVENOUS | Status: AC
  Administered 2019-03-30: 23:00:00 2000 mg via INTRAVENOUS
  Filled 2019-03-30: qty 2000

## 2019-03-30 NOTE — Progress Notes (Signed)
Pharmacy Antibiotic Note  Mike [name] is a 73 y.o. male admitted on 03/26/2019 with sepsis.  Pharmacy has been consulted for Vancomycin, Cefepime dosing.  Plan: Cefepime 2 gm IV Q8H ordered to start on 10/11 @ 2200.  Vancomycin 2 gm IV X 1 ordered to be given on 10/11 @ ~ 2300.  Vancomycin 1250 mg IV Q24H ordered to start on 10/12 @ 2300. No peak or trough currently ordered yet.   VD = 45 L Ke = 0.055 hr-1 T1/2 = 12.6 hrs AUC = 503.2  Vanc trough = 10.9   Height: 5\' 7"  (170.2 cm) Weight: 198 lb 6.6 oz (90 kg) IBW/kg (Calculated) : 66.1  Temp (24hrs), Avg:100.6 F (38.1 C), Min:99.3 F (37.4 C), Max:103.1 F (39.5 C)  Recent Labs  Lab 03/26/19 2012 03/27/19 0255 03/28/19 0543 03/29/19 0448 03/30/19 0614  WBC 2.8* 2.9* 2.8* 3.0* 3.1*  CREATININE 1.43* 1.30* 1.22 0.99  --   LATICACIDVEN 2.2* 2.3*  --   --   --   VANCORANDOM  --  22  --   --   --     Estimated Creatinine Clearance: 70.1 mL/min (by C-G formula based on SCr of 0.99 mg/dL).    Allergies  Allergen Reactions  . Naproxen Sodium Other (See Comments)    Reaction: Upset stomach  . Nsaids Other (See Comments)    Other reaction(s): Unknown  . Aleve [Naproxen] Nausea And Vomiting  . Ibuprofen Nausea Only    Reaction: Upset stomach     Antimicrobials this admission:   >>    >>   Dose adjustments this admission:   Microbiology results:  BCx:   UCx:    Sputum:    MRSA PCR:   Thank you for allowing pharmacy to be a part of this patient's care.  Mike [name] D 03/30/2019 10:03 PM

## 2019-03-30 NOTE — Progress Notes (Signed)
[facility] [place] NAME: Mike [name]    MR#:  [id number]  DATE OF BIRTH:  08/29/1945  SUBJECTIVE:   Patient noted to have developed fevers last night with temperature of 101.4 last night.  No fever this morning.  Patient with nonspecific complaints of pains.  Later reported having right shoulder pain.  X-rays of right shoulder was ordered.  REVIEW OF SYSTEMS:    Review of Systems  Constitutional: Positive for fever. Negative for chills.  HENT: Negative for congestion and tinnitus.   Eyes: Negative for blurred vision and double vision.  Respiratory: Negative for cough, shortness of breath and wheezing.   Cardiovascular: Negative for chest pain, orthopnea and PND.  Gastrointestinal: Negative for abdominal pain, diarrhea, nausea and vomiting.  Genitourinary: Negative for dysuria and hematuria.  Musculoskeletal: Negative for falls.       Right shoulder pain  Neurological: Positive for weakness (generalized). Negative for dizziness, sensory change and focal weakness.  All other systems reviewed and are negative.   Nutrition: Heart Healthy/Carb control Tolerating Diet: Yes Tolerating PT: Await Eval.    DRUG ALLERGIES:   Allergies  Allergen Reactions  . Naproxen Sodium Other (See Comments)    Reaction: Upset stomach  . Nsaids Other (See Comments)    Other reaction(s): Unknown  . Aleve [Naproxen] Nausea And Vomiting  . Ibuprofen Nausea Only    Reaction: Upset stomach     VITALS:  Blood pressure (!) 168/78, pulse 83, temperature 99.5 F (37.5 C), temperature source Oral, resp. rate 19, height _0  (1.702 m), weight 90 kg, SpO2 96 %.  PHYSICAL EXAMINATION:   Physical Exam  GENERAL:  73 y.o.-year-old obese patient lying in bed in no acute distress.  EYES: Pupils equal, round, reactive to light and accommodation. No scleral icterus. Extraocular muscles intact.  HEENT: Head atraumatic, normocephalic. Oropharynx and nasopharynx  clear.  NECK:  Supple, no jugular venous distention. No thyroid enlargement, no tenderness.  LUNGS: Normal breath sounds bilaterally, no wheezing, rales, rhonchi. No use of accessory muscles of respiration.  CARDIOVASCULAR: S1, S2 normal. No murmurs, rubs, or gallops.  ABDOMEN: Soft, nontender, nondistended. Bowel sounds present. No organomegaly or mass.  EXTREMITIES: No cyanosis, clubbing or edema b/l.    NEUROLOGIC: Cranial nerves II through XII are intact. No focal Motor or sensory deficits b/l.  Globally weak.  PSYCHIATRIC: The patient is alert and oriented x 3.  SKIN: No obvious rash, lesion, or ulcer.   Rectum - Redness around the per-rectal and area.    LABORATORY PANEL:   CBC Recent Labs  Lab 03/30/19 0614  WBC 3.1*  HGB 8.3*  HCT 26.7*  PLT 103*   ------------------------------------------------------------------------------------------------------------------  Chemistries  Recent Labs  Lab 03/26/19 2012  03/29/19 0448  NA 138   < > 136  K 4.4   < > 3.9  CL 100   < > 106  CO2 27   < > 23  GLUCOSE 197*   < > 203*  BUN 30*   < > 21  CREATININE 1.43*   < > 0.99  CALCIUM 9.5   < > 8.1*  AST 20  --   --   ALT 14  --   --   ALKPHOS 116  --   --   BILITOT 0.9  --   --    < > = values in this interval not displayed.   ------------------------------------------------------------------------------------------------------------------  Cardiac Enzymes No results for input(s): TROPONINI  in the last 168 hours. ------------------------------------------------------------------------------------------------------------------  RADIOLOGY:  Ct Abdomen Pelvis Wo Contrast  Result Date: 03/28/2019 CLINICAL DATA:  Abdominal pain and fever.  Rule out abscess. EXAM: CT ABDOMEN AND PELVIS WITHOUT CONTRAST TECHNIQUE: Multidetector CT imaging of the abdomen and pelvis was performed following the standard protocol without IV contrast. COMPARISON:  None. FINDINGS: Lower chest: Lung  bases clear bilaterally. Hepatobiliary: No focal liver abnormality is seen. No gallstones, gallbladder wall thickening, or biliary dilatation. Pancreas: Negative Spleen: Negative Adrenals/Urinary Tract: No renal calculi. No renal mass. Mild fullness of the renal collecting system due to markedly distended urinary bladder. Prior prostatectomy. No bladder mass. Stomach/Bowel: Normal stomach and duodenum. Negative for bowel obstruction. No bowel mass or edema. Appendix not visualized. Vascular/Lymphatic: Negative Reproductive: Prostatectomy.  No pelvic mass Other: Negative for abscess Musculoskeletal: Mild degenerative changes in the lumbar spine. No acute skeletal abnormality. IMPRESSION: Markedly distended urinary bladder. Prior prostatectomy for cancer. No mass lesion in the pelvis. Negative for abscess or source of infection. Electronically Signed   By: Franchot Gallo M.D.   On: 03/28/2019 16:53   Dg Chest 1 View  Result Date: 03/30/2019 CLINICAL DATA:  Fever. EXAM: CHEST  1 VIEW COMPARISON:  March 26, 2019. FINDINGS: The heart size and mediastinal contours are within normal limits. Both lungs are clear. The visualized skeletal structures are unremarkable. IMPRESSION: No active disease. Electronically Signed   By: Marijo Conception M.D.   On: 03/30/2019 08:47   Dg Lumbar Spine 2-3 Views  Result Date: 03/28/2019 CLINICAL DATA:  Recent fall at home. Pt does not remember falling. No known previous injury or surgery to pelvis or lumbar spine. EXAM: LUMBAR SPINE - 2-3 VIEW COMPARISON:  None. FINDINGS: There is no evidence of lumbar spine fracture. Alignment is normal. Intervertebral disc spaces are maintained. Flowing anterior osteophytosis. Gaseous distension of bowel loops throughout the abdomen. No acute finding in the visualized pelvis. SI joints are open. IMPRESSION: No acute osseous abnormality in the lumbar spine Electronically Signed   By: Audie Pinto M.D.   On: 03/28/2019 17:05   Dg Pelvis 1-2  Views  Result Date: 03/28/2019 CLINICAL DATA:  Recent fall at home. Pt does not remember falling. No known previous injury or surgery to pelvis or lumbar spine. EXAM: PELVIS - 1-2 VIEW COMPARISON:  Pelvis radiographs 03/23/2010 FINDINGS: There is no evidence of pelvic fracture or diastasis. Hips appear normally aligned. No focal bony lesion. Bilateral postoperative clips. No pelvic bone lesions are seen. Regional soft tissues unremarkable. IMPRESSION: Negative pelvis radiographs. Electronically Signed   By: Audie Pinto M.D.   On: 03/28/2019 17:07     ASSESSMENT AND PLAN:   73 year old male with past medical history of diabetes, seizures, hypertension, hyperlipidemia, history of prostate cancer, anxiety, GERD, history of coronary disease to mitral valve regurgitation who presented to the hospital due to fever, fall and shortness of breath.  1.  Sepsis-patient met criteria on admission given his fever, tachycardia recently. -Source of sepsis remains unclear.  Recent chest x-ray is negative for acute pneumonia, urinalysis is negative, patient's COVID-19 test is negative. - MRSA PCR negative.  Patient was initially placed on broad-spectrum IV antibiotics with vancomycin, cefepime and Flagyl which was previously discontinued. However due to development of fever last night with temperature of 101.4, recent antibiotics with Unasyn.  Requested for chest x-ray, urinalysis as well as repeat urine and blood cultures.  2.  Pancytopenia- etiology unclear presently. Stable -Seen by hematology oncology and as per them patient  has a baseline microcytic anemia now has poor reserves and therefore possibly could be underlying iron deficiency. -No evidence of hemolysis based on blood work.  No plans for bone marrow biopsy presently. -Continue supportive care with transfusions as needed.  Follow serial counts. Patient was evaluated by hematologist and given patient's microcytic anemia and no GI evaluation since  2004, she recommended getting GI consult.  Patient seen by Dr. Vicente Males with plans to consider EGD and colonoscopy after patient is completed treatment for sepsis  3. Abdominal/Rectal/Lower back pain -patient had a fall prior to admission.   Due to complaints of abdominal pains yesterday patient had CT abdomen and pelvis done with no acute findings apart from distended urinary bladder which is being managed as outlined below.  X-ray of the lumbar spine and pelvis negative for any acute findings. This morning patient complained of right shoulder pain.  X-rays of right shoulder as requested  4.  History of seizures- no acute seizure activity.  Continue phenobarbital.  5.  GERD- continue Protonix.  6.  Anxiety- continue Paxil, Xanax.  7.  Essential hypertension- continue Toprol.  8.  Neuropathy- continue gabapentin.  9.  Chronic Diastolic CHF - clinically not in CHF.  - cont. Lasix, Toprol.    10.  Urinary retention Noted distended urinary bladder on CT scan.  In and out catheterization done recently with about 1.5 L of urine output.  Yesterday evening patient had repeat urinary retention and Foley catheter was placed and left in place with at least 1.3 L of urine output.  Flomax already initiated yesterday. I discussed case urologist on call today Dr. Jeffie Pollock who recommended leaving Foley catheter in place continue Flomax which recommendation to follow-up with urology clinic in 1 week for possible voiding trial. Patient appears to be having some bladder spasm.  Placed on B&O suppositories as needed.  Physical therapy working with patient.  Recommended skilled nursing facility placement on discharge.  Pt. Does not want to go to [facility] and social work made aware.   DVT prophylaxis; SCDs No heparin products due to thrombocytopenia  All the records are reviewed and case discussed with Care Management/Social Worker. Management plans discussed with the patient, family and they are in  agreement. Updated patient sister present at bedside on treatment plans and all questions were answered.  CODE STATUS: Full code  DVT Prophylaxis: Ted's & SCD's.   TOTAL TIME TAKING CARE OF THIS PATIENT: 32 minutes.   POSSIBLE D/C IN 3DAYS, DEPENDING ON CLINICAL CONDITION.   Caylyn Tedeschi M.D on 03/30/2019 at 1:05 PM  Between 7am to 6pm - Pager - [phone]  After 6pm go to [email or web address] - Proofreader  [facility]  Office  [phone]  CC: Primary care physician; Adin Hector, MD

## 2019-03-30 NOTE — Progress Notes (Signed)
Dr. Brett Albino notified of temp 102.5, and elevation to 2 hrs later 103. Patient is alert, able to follow directions, no complaints at this time. New orders placed. Will continue to monitor.

## 2019-03-30 NOTE — Consult Note (Addendum)
Mike [name] , MD [address], [place], [place], [place], [address] [id number], [place], [place], [place], [address] Phone: [phone]  Fax: [phone]  Consultation  Referring Provider:   Dr Stark Jock  Primary Care Physician:  Adin Hector, MD Primary Gastroenterologist: None         Reason for Consultation:     Anemia  Date of Admission:  03/26/2019 Date of Consultation:  03/30/2019         HPI:   Mike [name] is a 73 y.o. male has a history of prostate cancer, diabetes presented to the hospital with fever fall and shortness of breath.  He is being febrile tachycardic and source of sepsis remains unclear.  He also has pancytopenia.  He was admitted on 03/26/2019.  03/28/2019: CT scan of the abdomen and pelvis.  Markedly distended urinary bladder, prior prostatectomy for cancer.  No other gross lesions noted.   I have been consulted to evaluate for iron deficiency anemia.  On admission 03/26/2019 noted hemoglobin of 7.9 g with an MCV of 69.1.  Going back even 8 months earlier he had a microcytic anemia.  Plan in internal medicine clinic when seen by his primary care physician on 03/13/2019 was referred to hematology.  Ferritin was 74 in March 2020. This admission ferritin was 425 with an iron which is low at 23 and percentage iron saturation low at 9.  Normal B12 levels.  Normal folate levels.  He has been evaluated by Dr. Mike Gip.  I have been consulted for GI evaluation.  He has had none since 2004.  Denies any rectal bleeding or hematemesis or melena, no NSAID use . Does complain about some LUQ spasms on and off.  Past Medical History:  Diagnosis Date  . Anxiety   . Diabetes mellitus without complication ([place])   . Epilepsy ([place])   . High cholesterol   . Hypertension   . Prostate cancer [facility]) 2004   prostate removed    Past Surgical History:  Procedure Laterality Date  . CARDIAC SURGERY    . IRRIGATION AND DEBRIDEMENT FOOT Right 06/14/2018   Procedure: IRRIGATION  AND DEBRIDEMENT FOOT;  Surgeon: Sharlotte Alamo, DPM;  Location: [facility] ORS;  Service: Podiatry;  Laterality: Right;    Prior to Admission medications   Medication Sig Start Date End Date Taking? Authorizing Provider  acetaminophen-codeine (TYLENOL #3) 300-30 MG tablet Take 1 tablet by mouth every 8 (eight) hours as needed for moderate pain. 02/14/19  Yes Gregor Hams, MD  ALPRAZolam Duanne Moron) 0.5 MG tablet Take 0.5-1 mg by mouth 2 (two) times daily. 0.5 mg in the morning and 1 mg at bedtime   Yes [provider]  aspirin EC 81 MG tablet Take 81 mg by mouth daily.   Yes [provider]  atorvastatin (LIPITOR) 20 MG tablet Take 20 mg by mouth daily.   Yes [provider]  Cholecalciferol (VITAMIN D-1000 MAX ST) 25 MCG (1000 UT) tablet Take 1,000 Units by mouth daily. 08/30/12  Yes [provider]  Cyanocobalamin (VITAMIN B-12 CR) 1000 MCG TBCR Take 2,000 mcg by mouth daily.  12/27/10  Yes [provider]  furosemide (LASIX) 40 MG tablet Take 40 mg by mouth daily as needed for edema. 02/19/19  Yes [provider]  gabapentin (NEURONTIN) 300 MG capsule Take 300 mg by mouth daily.   Yes [provider]  gabapentin (NEURONTIN) 300 MG capsule Take 1,200 mg by mouth at bedtime.   Yes  [provider]  glimepiride (AMARYL) 2 MG tablet Take 2-4 mg by mouth 2 (two) times daily. 4 mg in the morning and 2 mg in the evening 07/19/18  Yes [provider]  hydrOXYzine (ATARAX/VISTARIL) 25 MG tablet Take 25-50 mg by mouth 2 (two) times daily. 25 mg in the morning and 50 mg at bedtime 01/05/15  Yes [provider]  irbesartan (AVAPRO) 300 MG tablet Take 300 mg by mouth daily.    Yes [provider]  metoprolol succinate (TOPROL-XL) 50 MG 24 hr tablet Take 50 mg by mouth 2 (two) times daily. 01/01/19  Yes [provider]  nitroGLYCERIN (NITROSTAT) 0.4 MG SL tablet Take 0.4 mg by mouth every 5 (five) minutes as needed for  chest pain.  07/24/14  Yes [provider]  Omega-3 1000 MG CAPS Take 1,000 mg by mouth daily. 12/23/10  Yes [provider]  omeprazole (PRILOSEC) 20 MG capsule Take 20 mg by mouth daily.  12/19/14  Yes [provider]  PARoxetine (PAXIL) 20 MG tablet Take 1 tablet (20 mg total) by mouth daily. 06/18/18  Yes Gladstone Lighter, MD  PHENobarbital (LUMINAL) 64.8 MG tablet Take 194.4 mg by mouth daily.   Yes [provider]  pioglitazone (ACTOS) 30 MG tablet Take 30 mg by mouth daily. 01/23/19  Yes [provider]  potassium chloride (KLOR-CON) 10 MEQ tablet Take 10 mEq by mouth daily as needed (when taking furosemide).  02/19/19  Yes [provider]    No family history on file.   Social History   Tobacco Use  . Smoking status: Never Smoker  . Smokeless tobacco: Never Used  Substance Use Topics  . Alcohol use: No  . Drug use: No    Allergies as of 03/26/2019 - Review Complete 03/26/2019  Allergen Reaction Noted  . Naproxen sodium  05/23/2013  . Nsaids  03/12/2015  . Aleve [naproxen] Nausea And Vomiting 10/03/2016  . Ibuprofen Nausea Only 05/23/2013    Review of Systems:    All systems reviewed and negative except where noted in HPI.   Physical Exam:  Vital signs in last 24 hours: Temp:  [98.7 F (37.1 C)-101.4 F (38.6 C)] 99.8 F (37.7 C) (10/11 0437) Pulse Rate:  [66-135] 70 (10/11 0437) Resp:  [16-20] 16 (10/11 0437) BP: (130-174)/(58-95) 130/78 (10/11 0437) SpO2:  [97 %-99 %] 98 % (10/11 0437) Last BM Date: 03/28/19 General:   Pleasant, cooperative in NAD Head:  Normocephalic and atraumatic. Eyes:   No icterus.   Conjunctiva pink. PERRLA. Ears:  Normal auditory acuity. Neck:  Supple; no masses or thyroidomegaly Lungs: Respirations even and unlabored. Lungs clear to auscultation bilaterally.   No wheezes, crackles, or rhonchi.  Heart: mid line sternal scar  Regular rate and rhythm;  Without murmur, clicks, rubs or gallops  Abdomen:  Soft, nondistended, nontender. Normal bowel sounds. No appreciable masses or hepatomegaly.  No rebound or guarding.  Neurologic:  Alert and oriented x3;  grossly normal neurologically. Skin:  Intact without significant lesions or rashes. Cervical Nodes:  No significant cervical adenopathy. Psych:  Alert and cooperative. Normal affect.  LAB RESULTS: Recent Labs    03/28/19 0543 03/29/19 0448 03/30/19 0614  WBC 2.8* 3.0* 3.1*  HGB 7.5* 7.5* 8.3*  HCT 23.9* 23.7* 26.7*  PLT 108* 105* 103*   BMET Recent Labs    03/28/19 0543 03/29/19 0448  NA 141 136  K 3.9 3.9  CL 108 106  CO2 23 23  GLUCOSE 152*  203*  BUN 25* 21  CREATININE 1.22 0.99  CALCIUM 8.3* 8.1*   LFT No results for input(s): PROT, ALBUMIN, AST, ALT, ALKPHOS, BILITOT, BILIDIR, IBILI in the last 72 hours. PT/INR No results for input(s): LABPROT, INR in the last 72 hours.  STUDIES: Ct Abdomen Pelvis Wo Contrast  Result Date: 03/28/2019 CLINICAL DATA:  Abdominal pain and fever.  Rule out abscess. EXAM: CT ABDOMEN AND PELVIS WITHOUT CONTRAST TECHNIQUE: Multidetector CT imaging of the abdomen and pelvis was performed following the standard protocol without IV contrast. COMPARISON:  None. FINDINGS: Lower chest: Lung bases clear bilaterally. Hepatobiliary: No focal liver abnormality is seen. No gallstones, gallbladder wall thickening, or biliary dilatation. Pancreas: Negative Spleen: Negative Adrenals/Urinary Tract: No renal calculi. No renal mass. Mild fullness of the renal collecting system due to markedly distended urinary bladder. Prior prostatectomy. No bladder mass. Stomach/Bowel: Normal stomach and duodenum. Negative for bowel obstruction. No bowel mass or edema. Appendix not visualized. Vascular/Lymphatic: Negative Reproductive: Prostatectomy.  No pelvic mass Other: Negative for abscess Musculoskeletal: Mild degenerative changes in the lumbar spine. No acute skeletal abnormality. IMPRESSION: Markedly distended  urinary bladder. Prior prostatectomy for cancer. No mass lesion in the pelvis. Negative for abscess or source of infection. Electronically Signed   By: Franchot Gallo M.D.   On: 03/28/2019 16:53   Dg Lumbar Spine 2-3 Views  Result Date: 03/28/2019 CLINICAL DATA:  Recent fall at home. Pt does not remember falling. No known previous injury or surgery to pelvis or lumbar spine. EXAM: LUMBAR SPINE - 2-3 VIEW COMPARISON:  None. FINDINGS: There is no evidence of lumbar spine fracture. Alignment is normal. Intervertebral disc spaces are maintained. Flowing anterior osteophytosis. Gaseous distension of bowel loops throughout the abdomen. No acute finding in the visualized pelvis. SI joints are open. IMPRESSION: No acute osseous abnormality in the lumbar spine Electronically Signed   By: Audie Pinto M.D.   On: 03/28/2019 17:05   Dg Pelvis 1-2 Views  Result Date: 03/28/2019 CLINICAL DATA:  Recent fall at home. Pt does not remember falling. No known previous injury or surgery to pelvis or lumbar spine. EXAM: PELVIS - 1-2 VIEW COMPARISON:  Pelvis radiographs 03/23/2010 FINDINGS: There is no evidence of pelvic fracture or diastasis. Hips appear normally aligned. No focal bony lesion. Bilateral postoperative clips. No pelvic bone lesions are seen. Regional soft tissues unremarkable. IMPRESSION: Negative pelvis radiographs. Electronically Signed   By: Audie Pinto M.D.   On: 03/28/2019 17:07      Impression / Plan:   NAWEED DOLLARHIDE is a 73 y.o. y/o male with a history of prostate cancer admitted with sepsis of unclear etiology.  He also has some rectal sacral pain and microcytic anemia going on for greater than 8 months.  No prior GI evaluation.  Iron studies show a normal ferritin but a low iron level.  Normal B12 and folate levels.  CT scan of the abdomen shows no abnormality apart from a distended urinary bladder.  Plan 1.  Once he has been treated for sepsis and has resolved can consider EGD plus  colonoscopy.  Replace iron which is low.  No role for stool occult blood testing as it is a colon cancer screening test and has no importance in a setting of anemia.  A negative test will not prevent recommending a GI work-up.  2. Moderate blood seen in the urine- this could also contribute to an anemia.    Thank you for involving me in the care of this patient.  LOS: 3 days   Mike Bellows, MD  03/30/2019, 8:26 AM

## 2019-03-30 NOTE — Progress Notes (Signed)
Patient had temp of 101.4, [name], NP notified. Will continue to monitor.

## 2019-03-30 NOTE — Progress Notes (Signed)
[facility] Hematology/Oncology Progress Note  Date of admission: 03/26/2019  Hospital day:  03/30/2019  Chief Complaint: Mike [name] is a 73 y.o. male with a history of prostate cancer, diabetes, and a seizure disorder who was admitted with presumed sepsis.  Subjective:  Patient notes ongoing rectal pain.  He denies any melena or hematochezia.  Diet is poor.  Patient s/p catheterization for retained urine.  Social History: The patient is accompanied by his sister, Enis Slipper, today.  Allergies:  Allergies  Allergen Reactions  . Naproxen Sodium Other (See Comments)    Reaction: Upset stomach  . Nsaids Other (See Comments)    Other reaction(s): Unknown  . Aleve [Naproxen] Nausea And Vomiting  . Ibuprofen Nausea Only    Reaction: Upset stomach     Scheduled Medications: . ALPRAZolam  0.5-1 mg Oral BID  . aspirin EC  81 mg Oral Daily  . atorvastatin  20 mg Oral Daily  . Chlorhexidine Gluconate Cloth  6 each Topical Daily  . cholecalciferol  1,000 Units Oral Daily  . furosemide  40 mg Oral Daily  . gabapentin  1,200 mg Oral QHS  . gabapentin  300 mg Oral Daily  . insulin aspart  0-5 Units Subcutaneous QHS  . insulin aspart  0-9 Units Subcutaneous TID WC  . ipratropium-albuterol  3 mL Nebulization BID  . metoprolol succinate  50 mg Oral BID  . omega-3 acid ethyl esters  1,000 mg Oral Daily  . pantoprazole  40 mg Oral Daily  . PARoxetine  20 mg Oral Daily  . PHENobarbital  194.4 mg Oral Daily  . sodium chloride flush  10-40 mL Intracatheter Q12H  . tamsulosin  0.4 mg Oral Daily  . vitamin B-12  2,000 mcg Oral Daily    Review of Systems: GENERAL:  Feels "bad".  No fevers, sweats or weight loss. PERFORMANCE STATUS (ECOG):  2 HEENT:  No visual changes, runny nose, sore throat, mouth sores or tenderness. Lungs: No shortness of breath or cough.  No hemoptysis. Cardiac:  No chest pain, palpitations, orthopnea, or PND. GI:  No nausea, vomiting, diarrhea,  constipation, melena or hematochezia. GU:  S/p catheterization secondary to urinary retention.  No urgency, frequency, dysuria, or hematuria. Musculoskeletal:  Tailbone/rectal pain.  No joint pain.  No muscle tenderness. Extremities:  No pain or swelling. Skin:  Lower extremities pruritic at times.  No rashes or skin changes. Neuro:  No headache, numbness or weakness, balance or coordination issues. Endocrine:  No diabetes, thyroid issues, hot flashes or night sweats. Psych:  No mood changes, depression or anxiety. Pain:  Rectal/tailbone pain persists. Review of systems:  All other systems reviewed and found to be negative.   Physical Exam: Blood pressure (!) 168/78, pulse 83, temperature 99.5 F (37.5 C), temperature source Oral, resp. rate 19, height 5\' 7"  (1.702 m), weight 198 lb 6.6 oz (90 kg), SpO2 96 %.  GENERAL:  Chronically fatigued gentleman sitting comfortably in bed on the medical unit in no acute distress intermittently yelling out in pain (less than yesterday). MENTAL STATUS:  Alert and oriented to person, place (hospital) and time (2020). HEAD:  Short gray hair.  Normocephalic, atraumatic, face symmetric, no Cushingoid features. EYES:  Dark rimmed glasses.  Blue eyes.  Pupils equal round and reactive to light and accomodation.  No conjunctivitis or scleral icterus. ENT:  Oropharynx clear without lesion.  Tongue normal. Mucous membranes moist.  RESPIRATORY:  Clear to auscultation without rales, wheezes or rhonchi. CARDIOVASCULAR:  Regular  rate and rhythm without murmur, rub or gallop. ABDOMEN:  Fully round.  Soft, slightly tender to palpation without guarding or rebound tenderness. Active bowel sounds and no hepatosplenomegaly.  No masses. SKIN:  Old lower extremity excoriations.  No rashes, ulcers or lesions. EXTREMITIES: No edema, no skin discoloration or tenderness.  No palpable cords. NEUROLOGICAL: Unremarkable. PSYCH:  Appropriate.    Results for orders placed or  performed during the hospital encounter of 03/26/19 (from the past 48 hour(s))  Glucose, capillary     Status: Abnormal   Collection Time: 03/28/19  5:18 PM  Result Value Ref Range   Glucose-Capillary 114 (H) 70 - 99 mg/dL  Glucose, capillary     Status: Abnormal   Collection Time: 03/28/19 10:26 PM  Result Value Ref Range   Glucose-Capillary 238 (H) 70 - 99 mg/dL  CBC     Status: Abnormal   Collection Time: 03/29/19  4:48 AM  Result Value Ref Range   WBC 3.0 (L) 4.0 - 10.5 K/uL   RBC 3.27 (L) 4.22 - 5.81 MIL/uL   Hemoglobin 7.5 (L) 13.0 - 17.0 g/dL    Comment: Reticulocyte Hemoglobin testing may be clinically indicated, consider ordering this additional test [id number]    HCT 23.7 (L) 39.0 - 52.0 %   MCV 72.5 (L) 80.0 - 100.0 fL   MCH 22.9 (L) 26.0 - 34.0 pg   MCHC 31.6 30.0 - 36.0 g/dL   RDW 21.6 (H) 11.5 - 15.5 %   Platelets 105 (L) 150 - 400 K/uL    Comment: Immature Platelet Fraction may be clinically indicated, consider ordering this additional test [id number]    nRBC 1.7 (H) 0.0 - 0.2 %    Comment: Performed at [facility], [address] [place], [place] [id number]  Basic metabolic panel     Status: Abnormal   Collection Time: 03/29/19  4:48 AM  Result Value Ref Range   Sodium 136 135 - 145 mmol/L   Potassium 3.9 3.5 - 5.1 mmol/L   Chloride 106 98 - 111 mmol/L   CO2 23 22 - 32 mmol/L   Glucose, Bld 203 (H) 70 - 99 mg/dL   BUN 21 8 - 23 mg/dL   Creatinine, Ser 0.99 0.61 - 1.24 mg/dL   Calcium 8.1 (L) 8.9 - 10.3 mg/dL   GFR calc non Af Amer >60 >60 mL/min   GFR calc Af Amer >60 >60 mL/min   Anion gap 7 5 - 15    Comment: Performed at [facility], [address] [place], [place] [address]  Glucose, capillary     Status: Abnormal   Collection Time: 03/29/19  7:42 AM  Result Value Ref Range   Glucose-Capillary 202 (H) 70 - 99 mg/dL  Glucose, capillary     Status: Abnormal   Collection Time: 03/29/19 11:56 AM  Result Value Ref Range    Glucose-Capillary 303 (H) 70 - 99 mg/dL  Glucose, capillary     Status: Abnormal   Collection Time: 03/29/19  5:07 PM  Result Value Ref Range   Glucose-Capillary 201 (H) 70 - 99 mg/dL  Glucose, capillary     Status: Abnormal   Collection Time: 03/29/19  9:17 PM  Result Value Ref Range   Glucose-Capillary 193 (H) 70 - 99 mg/dL  CBC with Differential/Platelet     Status: Abnormal   Collection Time: 03/30/19  6:14 AM  Result Value Ref Range   WBC 3.1 (L) 4.0 - 10.5 K/uL   RBC 3.56 (L)  4.22 - 5.81 MIL/uL   Hemoglobin 8.3 (L) 13.0 - 17.0 g/dL    Comment: Reticulocyte Hemoglobin testing may be clinically indicated, consider ordering this additional test [id number]    HCT 26.7 (L) 39.0 - 52.0 %   MCV 75.0 (L) 80.0 - 100.0 fL   MCH 23.3 (L) 26.0 - 34.0 pg   MCHC 31.1 30.0 - 36.0 g/dL   RDW 22.1 (H) 11.5 - 15.5 %   Platelets 103 (L) 150 - 400 K/uL    Comment: Immature Platelet Fraction may be clinically indicated, consider ordering this additional test [id number]    nRBC 1.0 (H) 0.0 - 0.2 %   Neutrophils Relative % 29 %   Neutro Abs 0.9 (L) 1.7 - 7.7 K/uL   Lymphocytes Relative 43 %   Lymphs Abs 1.3 0.7 - 4.0 K/uL   Monocytes Relative 17 %   Monocytes Absolute 0.5 0.1 - 1.0 K/uL   Eosinophils Relative 3 %   Eosinophils Absolute 0.1 0.0 - 0.5 K/uL   Basophils Relative 1 %   Basophils Absolute 0.0 0.0 - 0.1 K/uL   WBC Morphology MILD LEFT SHIFT (1-5% METAS, OCC MYELO, OCC BANDS)    RBC Morphology MIXED RBC POPULATION    Smear Review Normal platelet morphology    Immature Granulocytes 7 %   Abs Immature Granulocytes 0.23 (H) 0.00 - 0.07 K/uL   Tear Drop Cells PRESENT     Comment: Performed at [facility], [address] [place], [place] [address]  Type and screen [place]     Status: None   Collection Time: 03/30/19  6:14 AM  Result Value Ref Range   ABO/RH(D) O POS    Antibody Screen NEG    Sample Expiration      04/02/2019,2359 Performed  at [facility], [address] [place], [place] [address]   PSA     Status: None   Collection Time: 03/30/19  6:14 AM  Result Value Ref Range   Prostatic Specific Antigen 0.92 0.00 - 4.00 ng/mL    Comment: (NOTE) While PSA levels of <=4.0 ng/ml are reported as reference range, some men with levels below 4.0 ng/ml can have prostate cancer and many men with PSA above 4.0 ng/ml do not have prostate cancer.  Other tests such as free PSA, age specific reference ranges, PSA velocity and PSA doubling time may be helpful especially in men less than 54 years old. Performed at [facility], [place] [address] [place], [place] [address]   Glucose, capillary     Status: Abnormal   Collection Time: 03/30/19  7:45 AM  Result Value Ref Range   Glucose-Capillary 191 (H) 70 - 99 mg/dL  Urinalysis, Routine w reflex microscopic     Status: Abnormal   Collection Time: 03/30/19  9:21 AM  Result Value Ref Range   Color, Urine YELLOW (A) YELLOW   APPearance CLEAR (A) CLEAR   Specific Gravity, Urine 1.016 1.005 - 1.030   pH 5.0 5.0 - 8.0   Glucose, UA >=500 (A) NEGATIVE mg/dL   Hgb urine dipstick MODERATE (A) NEGATIVE   Bilirubin Urine NEGATIVE NEGATIVE   Ketones, ur 20 (A) NEGATIVE mg/dL   Protein, ur 100 (A) NEGATIVE mg/dL   Nitrite NEGATIVE NEGATIVE   Leukocytes,Ua TRACE (A) NEGATIVE   RBC / HPF 6-10 0 - 5 RBC/hpf   WBC, UA 0-5 0 - 5 WBC/hpf   Bacteria, UA RARE (A) NONE SEEN   Squamous Epithelial /  LPF NONE SEEN 0 - 5   Mucus PRESENT     Comment: Performed at [facility], [address] [place], [place] [address]  Glucose, capillary     Status: Abnormal   Collection Time: 03/30/19 12:55 PM  Result Value Ref Range   Glucose-Capillary 312 (H) 70 - 99 mg/dL   Ct Abdomen Pelvis Wo Contrast  Result Date: 03/28/2019 CLINICAL DATA:  Abdominal pain and fever.  Rule out abscess. EXAM: CT ABDOMEN AND PELVIS WITHOUT CONTRAST TECHNIQUE: Multidetector CT imaging of the abdomen  and pelvis was performed following the standard protocol without IV contrast. COMPARISON:  None. FINDINGS: Lower chest: Lung bases clear bilaterally. Hepatobiliary: No focal liver abnormality is seen. No gallstones, gallbladder wall thickening, or biliary dilatation. Pancreas: Negative Spleen: Negative Adrenals/Urinary Tract: No renal calculi. No renal mass. Mild fullness of the renal collecting system due to markedly distended urinary bladder. Prior prostatectomy. No bladder mass. Stomach/Bowel: Normal stomach and duodenum. Negative for bowel obstruction. No bowel mass or edema. Appendix not visualized. Vascular/Lymphatic: Negative Reproductive: Prostatectomy.  No pelvic mass Other: Negative for abscess Musculoskeletal: Mild degenerative changes in the lumbar spine. No acute skeletal abnormality. IMPRESSION: Markedly distended urinary bladder. Prior prostatectomy for cancer. No mass lesion in the pelvis. Negative for abscess or source of infection. Electronically Signed   By: Franchot Gallo M.D.   On: 03/28/2019 16:53   Dg Chest 1 View  Result Date: 03/30/2019 CLINICAL DATA:  Fever. EXAM: CHEST  1 VIEW COMPARISON:  March 26, 2019. FINDINGS: The heart size and mediastinal contours are within normal limits. Both lungs are clear. The visualized skeletal structures are unremarkable. IMPRESSION: No active disease. Electronically Signed   By: Marijo Conception M.D.   On: 03/30/2019 08:47   Dg Lumbar Spine 2-3 Views  Result Date: 03/28/2019 CLINICAL DATA:  Recent fall at home. Pt does not remember falling. No known previous injury or surgery to pelvis or lumbar spine. EXAM: LUMBAR SPINE - 2-3 VIEW COMPARISON:  None. FINDINGS: There is no evidence of lumbar spine fracture. Alignment is normal. Intervertebral disc spaces are maintained. Flowing anterior osteophytosis. Gaseous distension of bowel loops throughout the abdomen. No acute finding in the visualized pelvis. SI joints are open. IMPRESSION: No acute osseous  abnormality in the lumbar spine Electronically Signed   By: Audie Pinto M.D.   On: 03/28/2019 17:05   Dg Pelvis 1-2 Views  Result Date: 03/28/2019 CLINICAL DATA:  Recent fall at home. Pt does not remember falling. No known previous injury or surgery to pelvis or lumbar spine. EXAM: PELVIS - 1-2 VIEW COMPARISON:  Pelvis radiographs 03/23/2010 FINDINGS: There is no evidence of pelvic fracture or diastasis. Hips appear normally aligned. No focal bony lesion. Bilateral postoperative clips. No pelvic bone lesions are seen. Regional soft tissues unremarkable. IMPRESSION: Negative pelvis radiographs. Electronically Signed   By: Audie Pinto M.D.   On: 03/28/2019 17:07    Assessment:  Mike [name] is a 73 y.o. male with with a seizure disorder who was admitted with presumed sepsis.  He has a history of a chronic microcytic anemia and intermittent leukopenia.   CBC on admission revealed a hematocrit of 25.5, hemoglobin 7.9, MCV 69.1, platelets 248,000, white count 2800 with an ANC 800.  Creatinine was 1.43.  Liver function tests were normal.  Lactic acid was 2.2.  Anemia work-up has included a ferritin of 425 with an iron saturation of 9% and a TIBC of 255.  B12 was 2930.  Folate  was 30.    Peripheral smear revealed a microcytic hypochromic anemia with anisopoikilocytosis.  There were teardrop cells and elliptocytes.  There was absolute leukopenia with relative lymphocytosis and a few large granular lymphocytes.  Absolute neutrophil count was 820.  Refills were unremarkable.  There was mild thrombocytopenia with normal platelet morphology.  There was no platelet clumping.  Abdomen and pelvic CT on 03/28/2019 revealed a markedly distended bladder.  There was no evidence of abscess.  Spleen was normal size.  Pelvis and lumbar plain films revealed no evidence of fracture.  Patient has a history of prostate cancer s/p radical prostatectomy in 2004.  PSA is 0.92 on 03/30/2019.  He has B12  deficiency and has been on oral B12.  Symptomatically, he continues to complain of rectal pain.  Pain was not relieved by Foley catheterization.    Plan:   1.  Microcytic anemia             Hematocrit 23.9.  Hemoglobin 7.5.  MCV 73.5 on 03/28/2019.  Hematocrit 23.7.  Hemoglobin 7.5.  MCV 72.5 on 03/29/2019.Marland Kitchen  Hematocrit 26.7.  Hemoglobin 8.3.  MCV 75.0 on 03/30/2019.  Patient s/p 1 unit of PRBCs on 03/27/2019.  Patient has a chronic baseline microcytic anemia.     Patient's sister notes that another sister and niece have beta-thalassemia.   Hemoglobin electrophoresis remains pending.  Patient has acute on chronic microcytic anemia    He has no current evidence of bleeding.    Stool guaiacs ordered.   Ferritin 425 with an iron saturation of 9% (low) and a TIBC of 255.   Sed rate is 129 (high) and likely falsely elevates ferritin.   Review plan for IV iron (Venofer 200 mg IV x 1).   Patient's diet remians poor.   Appreciate GI evaluation.    No GI evaluation since at least 2004.    Anticipate EGD and colonoscopy.   No evidence of hemolysis    Coombs and haptoglobin normal.   Retic 1.7% (inapproriately low).   Maintain active type and screen. 2.   Thrombocytopenia and leukopenia  Platelets 108,000.  WBC 2800 on 03/28/2019.  Platelets 105,000.  WBC 3000 on 03/29/2019.  Platelets 103,000.  WBC 3100 on 03/30/2019.  Normal studies to date:   ANA, hepatitis B and C serologies.             Unclear significance of large granular lymphocytes.   Flow cytometry remains pending.             Review of medications:                         Paxil (< 1% incidence of pancytopenia), phenobarbitol (undefined incidence of agranulocytosis, thrombocytopenia, and macrocytic anemia),                         Septra (leukopenia, thrombocytopenia, anemia)- patient's sister unsure when he last received.             CBC daily.    Neutropenic precautions. 3.   Urinary retention  Patient s/p in/out  catheterization with large volume urine (> 1 liter).  Patient has Foley catheter in place.  Hematuria noted on urinalysis from today, but not 03/26/2019.   Etiology catheterization/trauma.  Plan for patient to follow-up with urology in outpatient department. 4.   Rectal/sacral pain  Etiology remains unclear.  No abnormalities noted on CT scan and plain films. 5.  History of prostate cancer  Patient s/p prostatectomy in 2004.  PSA is 0.92 (biochemiocal recurrence).  Doubt bone pain related to h/o prostate cancer.   Lequita Asal, MD  03/30/2019, 2:12 PM

## 2019-03-30 NOTE — Progress Notes (Signed)
I was notified by RN that patient had a fever 102.30F. He was given tylenol. His temperature was checked 2 hours later and it was 103F. Unable to give ibuprofen due to allergy and possible GI bleed. Infectious work-up was performed on admission and was negative. Work-up was repeated today due to persistent fevers and has been unremarkable so far. Patient was previously on broad spectrum antibiotics, but is currently on Unasyn. Will place back on vancomycin and cefepime. Consider ID consult in the morning.  Hyman Bible, MD

## 2019-03-31 ENCOUNTER — Other Ambulatory Visit: Payer: Self-pay

## 2019-03-31 DIAGNOSIS — I10 Essential (primary) hypertension: Secondary | ICD-10-CM

## 2019-03-31 DIAGNOSIS — R509 Fever, unspecified: Secondary | ICD-10-CM

## 2019-03-31 DIAGNOSIS — R05 Cough: Secondary | ICD-10-CM

## 2019-03-31 DIAGNOSIS — Z9079 Acquired absence of other genital organ(s): Secondary | ICD-10-CM

## 2019-03-31 DIAGNOSIS — I2510 Atherosclerotic heart disease of native coronary artery without angina pectoris: Secondary | ICD-10-CM

## 2019-03-31 DIAGNOSIS — Z886 Allergy status to analgesic agent status: Secondary | ICD-10-CM

## 2019-03-31 DIAGNOSIS — H02106 Unspecified ectropion of left eye, unspecified eyelid: Secondary | ICD-10-CM

## 2019-03-31 DIAGNOSIS — F419 Anxiety disorder, unspecified: Secondary | ICD-10-CM

## 2019-03-31 DIAGNOSIS — I340 Nonrheumatic mitral (valve) insufficiency: Secondary | ICD-10-CM

## 2019-03-31 DIAGNOSIS — R569 Unspecified convulsions: Secondary | ICD-10-CM

## 2019-03-31 DIAGNOSIS — F329 Major depressive disorder, single episode, unspecified: Secondary | ICD-10-CM

## 2019-03-31 DIAGNOSIS — Z951 Presence of aortocoronary bypass graft: Secondary | ICD-10-CM

## 2019-03-31 LAB — CULTURE, BLOOD (ROUTINE X 2)
Culture: NO GROWTH
Culture: NO GROWTH
Special Requests: ADEQUATE
Special Requests: ADEQUATE

## 2019-03-31 LAB — PROTEIN ELECTROPHORESIS, SERUM
A/G Ratio: 1.1 (ref 0.7–1.7)
Albumin ELP: 3.2 g/dL (ref 2.9–4.4)
Alpha-1-Globulin: 0.3 g/dL (ref 0.0–0.4)
Alpha-2-Globulin: 1.1 g/dL — ABNORMAL HIGH (ref 0.4–1.0)
Beta Globulin: 0.9 g/dL (ref 0.7–1.3)
Gamma Globulin: 0.6 g/dL (ref 0.4–1.8)
Globulin, Total: 3 g/dL (ref 2.2–3.9)
Total Protein ELP: 6.2 g/dL (ref 6.0–8.5)

## 2019-03-31 LAB — GLUCOSE, CAPILLARY
Glucose-Capillary: 195 mg/dL — ABNORMAL HIGH (ref 70–99)
Glucose-Capillary: 204 mg/dL — ABNORMAL HIGH (ref 70–99)
Glucose-Capillary: 330 mg/dL — ABNORMAL HIGH (ref 70–99)
Glucose-Capillary: 264 mg/dL — ABNORMAL HIGH (ref 70–99)

## 2019-03-31 LAB — CBC WITH DIFFERENTIAL/PLATELET
Abs Immature Granulocytes: 0.24 10*3/uL — ABNORMAL HIGH (ref 0.00–0.07)
Basophils Absolute: 0 10*3/uL (ref 0.0–0.1)
Basophils Relative: 0 %
Eosinophils Absolute: 0.1 10*3/uL (ref 0.0–0.5)
Eosinophils Relative: 3 %
HCT: 22.9 % — ABNORMAL LOW (ref 39.0–52.0)
Hemoglobin: 7.3 g/dL — ABNORMAL LOW (ref 13.0–17.0)
Immature Granulocytes: 9 %
Lymphocytes Relative: 40 %
Lymphs Abs: 1.1 10*3/uL (ref 0.7–4.0)
MCH: 23.2 pg — ABNORMAL LOW (ref 26.0–34.0)
MCHC: 31.9 g/dL (ref 30.0–36.0)
MCV: 72.7 fL — ABNORMAL LOW (ref 80.0–100.0)
Monocytes Absolute: 0.4 10*3/uL (ref 0.1–1.0)
Monocytes Relative: 14 %
Neutro Abs: 1 10*3/uL — ABNORMAL LOW (ref 1.7–7.7)
Neutrophils Relative %: 34 %
Platelets: 108 10*3/uL — ABNORMAL LOW (ref 150–400)
RBC: 3.15 MIL/uL — ABNORMAL LOW (ref 4.22–5.81)
RDW: 21.9 % — ABNORMAL HIGH (ref 11.5–15.5)
Smear Review: NORMAL
WBC: 2.8 10*3/uL — ABNORMAL LOW (ref 4.0–10.5)
nRBC: 0 % (ref 0.0–0.2)

## 2019-03-31 LAB — MAGNESIUM: Magnesium: 1.6 mg/dL — ABNORMAL LOW (ref 1.7–2.4)

## 2019-03-31 LAB — HEMOGLOBINOPATHY EVALUATION
Hgb A2 Quant: 3.5 % — ABNORMAL HIGH (ref 1.8–3.2)
Hgb A: 95.2 % — ABNORMAL LOW (ref 96.4–98.8)
Hgb C: 0 %
Hgb F Quant: 1.3 % (ref 0.0–2.0)
Hgb S Quant: 0 %
Hgb Variant: 0 %

## 2019-03-31 LAB — CBC
HCT: 21.6 % — ABNORMAL LOW (ref 39.0–52.0)
Hemoglobin: 6.9 g/dL — ABNORMAL LOW (ref 13.0–17.0)
MCH: 23 pg — ABNORMAL LOW (ref 26.0–34.0)
MCHC: 31.9 g/dL (ref 30.0–36.0)
MCV: 72 fL — ABNORMAL LOW (ref 80.0–100.0)
Platelets: 101 10*3/uL — ABNORMAL LOW (ref 150–400)
RBC: 3 MIL/uL — ABNORMAL LOW (ref 4.22–5.81)
RDW: 21.6 % — ABNORMAL HIGH (ref 11.5–15.5)
WBC: 2.7 10*3/uL — ABNORMAL LOW (ref 4.0–10.5)
nRBC: 0.8 % — ABNORMAL HIGH (ref 0.0–0.2)

## 2019-03-31 LAB — BASIC METABOLIC PANEL
Anion gap: 8 (ref 5–15)
BUN: 15 mg/dL (ref 8–23)
CO2: 25 mmol/L (ref 22–32)
Calcium: 7.8 mg/dL — ABNORMAL LOW (ref 8.9–10.3)
Chloride: 101 mmol/L (ref 98–111)
Creatinine, Ser: 0.86 mg/dL (ref 0.61–1.24)
GFR calc Af Amer: >60 mL/min (ref >60)
GFR calc non Af Amer: >60 mL/min (ref >60)
Glucose, Bld: 223 mg/dL — ABNORMAL HIGH (ref 70–99)
Potassium: 3.4 mmol/L — ABNORMAL LOW (ref 3.5–5.1)
Sodium: 134 mmol/L — ABNORMAL LOW (ref 135–145)

## 2019-03-31 LAB — URINE CULTURE: Culture: NO GROWTH

## 2019-03-31 LAB — PROCALCITONIN: Procalcitonin: 0.28 ng/mL

## 2019-03-31 LAB — HEMOGLOBIN AND HEMATOCRIT, BLOOD
HCT: 22.9 % — ABNORMAL LOW (ref 39.0–52.0)
Hemoglobin: 7.2 g/dL — ABNORMAL LOW (ref 13.0–17.0)

## 2019-03-31 MED ORDER — PHENOBARBITAL SODIUM 65 MG/ML IJ SOLN: 65.0000 mg | Freq: Once | INTRAMUSCULAR | Status: DC

## 2019-03-31 MED ORDER — SODIUM CHLORIDE 0.9 % IV SOLN
100.0000 mg | Freq: Once | INTRAVENOUS | Status: AC
  Administered 2019-03-31: 18:00:00 100 mg via INTRAVENOUS
  Filled 2019-03-31: qty 10

## 2019-03-31 MED ORDER — PHENOBARBITAL SODIUM 130 MG/ML IJ SOLN
65.0000 mg | Freq: Two times a day (BID) | INTRAMUSCULAR | Status: AC
  Administered 2019-04-01 – 2019-04-02 (×4): 65 mg via INTRAVENOUS
  Filled 2019-03-31 (×7): qty 1

## 2019-03-31 MED ORDER — MAGNESIUM SULFATE 2 GM/50ML IV SOLN
2.0000 g | Freq: Once | INTRAVENOUS | Status: AC
  Administered 2019-03-31: 11:00:00 2 g via INTRAVENOUS
  Filled 2019-03-31: qty 50

## 2019-03-31 MED ORDER — LACOSAMIDE 50 MG PO TABS
100.0000 mg | ORAL_TABLET | Freq: Two times a day (BID) | ORAL | Status: DC
  Administered 2019-04-01 – 2019-04-09 (×17): 100 mg via ORAL
  Filled 2019-03-31 (×17): qty 2

## 2019-03-31 MED ORDER — POTASSIUM CHLORIDE CRYS ER 20 MEQ PO TBCR
40.0000 meq | EXTENDED_RELEASE_TABLET | Freq: Once | ORAL | Status: AC
  Administered 2019-03-31: 11:00:00 40 meq via ORAL
  Filled 2019-03-31: qty 2

## 2019-03-31 NOTE — NC FL2 (Signed)
[place] LEVEL OF CARE SCREENING TOOL     IDENTIFICATION  Patient Name: Mike [name] Birthdate: 1945-12-03 Sex: male Admission Date (Current Location): 03/26/2019  [place] and [place] Number:  Engineering geologist and Address:  [facility], [address], [place], [place] [address]      Provider Number: [id number]  Attending Physician Name and Address:  Otila Back, MD  Relative Name and Phone Number:  Buren Kos [phone]  Sister    Current Level of Care: Hospital Recommended Level of Care: [place] Prior Approval Number:    Date Approved/Denied:   PASRR Number: [id number]  Discharge Plan: SNF    Current Diagnoses: Patient Active Problem List   Diagnosis Date Noted  . Sepsis ([place]) 03/27/2019  . AKI (acute kidney injury) ([place]) 07/24/2018  . 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 ([place]) 06/13/2018    Orientation RESPIRATION BLADDER Height & Weight     Self  Normal Indwelling catheter Weight: 90 kg Height:  5\' 7"  (170.2 cm)  BEHAVIORAL SYMPTOMS/MOOD NEUROLOGICAL BOWEL NUTRITION STATUS      Incontinent Diet(Carb modified)  AMBULATORY STATUS COMMUNICATION OF NEEDS Skin   Extensive Assist Verbally Normal                       Personal Care Assistance Level of Assistance  Bathing, Feeding, Dressing Bathing Assistance: Limited assistance Feeding assistance: Limited assistance Dressing Assistance: Limited assistance     Functional Limitations Info             SPECIAL CARE FACTORS FREQUENCY  PT (By licensed PT), OT (By licensed OT)     PT Frequency: 5 times per week OT Frequency: 5 times per week            Contractures Contractures Info: Not present    Additional Factors Info  Code Status, Allergies Code Status Info: Full Allergies Info: Naproxen sodium, NSAIDS           Current Medications (03/31/2019):  This is the current hospital active medication list Current Facility-Administered  Medications  Medication Dose Route Frequency Provider Last Rate Last Dose  . 0.9 %  sodium chloride infusion   Intravenous Continuous Lang Snow, NP 75 mL/hr at 03/30/19 1819    . acetaminophen (TYLENOL) tablet 650 mg  650 mg Oral Q6H PRN Lang Snow, NP   650 mg at 03/30/19 2000   Or  . acetaminophen (TYLENOL) suppository 650 mg  650 mg Rectal Q6H PRN Lang Snow, NP      . acetaminophen-codeine (TYLENOL #3) 300-30 MG per tablet 1 tablet  1 tablet Oral Q8H PRN Lang Snow, NP      . ALPRAZolam Duanne Moron) tablet 0.5-1 mg  0.5-1 mg Oral BID Lang Snow, NP   1 mg at 03/30/19 2000  . aspirin EC tablet 81 mg  81 mg Oral Daily Lang Snow, NP   81 mg at 03/30/19 0846  . atorvastatin (LIPITOR) tablet 20 mg  20 mg Oral Daily Lang Snow, NP   20 mg at 03/30/19 1716  . ceFEPIme (MAXIPIME) 2 g in sodium chloride 0.9 % 100 mL IVPB  2 g Intravenous Q8H Orene Desanctis, RPH 200 mL/hr at 03/31/19 0521 2 g at 03/31/19 0521  . Chlorhexidine Gluconate Cloth 2 % PADS 6 each  6 each Topical Daily Stark Jock, Jude, MD   6 each at 03/30/19 0848  . cholecalciferol (VITAMIN D3) tablet 1,000 Units  1,000 Units Oral Daily Lang Snow, NP   1,000 Units at 03/30/19 0846  . docusate sodium (COLACE) capsule 100 mg  100 mg Oral BID PRN Henreitta Leber, MD      . furosemide (LASIX) tablet 40 mg  40 mg Oral Daily Henreitta Leber, MD   40 mg at 03/30/19 0848  . gabapentin (NEURONTIN) capsule 1,200 mg  1,200 mg Oral QHS Lang Snow, NP   1,200 mg at 03/30/19 2000  . gabapentin (NEURONTIN) capsule 300 mg  300 mg Oral Daily Henreitta Leber, MD   300 mg at 03/30/19 0846  . insulin aspart (novoLOG) injection 0-5 Units  0-5 Units Subcutaneous QHS Lang Snow, NP   2 Units at 03/30/19 2205  . insulin aspart (novoLOG) injection 0-9 Units  0-9 Units Subcutaneous TID WC Lang Snow, NP   2 Units at 03/31/19 0825   . ipratropium-albuterol (DUONEB) 0.5-2.5 (3) MG/3ML nebulizer solution 3 mL  3 mL Nebulization Q6H PRN Ojie, Jude, MD      . magnesium sulfate IVPB 2 g 50 mL  2 g Intravenous Once Ojie, Jude, MD      . metoprolol succinate (TOPROL-XL) 24 hr tablet 50 mg  50 mg Oral BID Lang Snow, NP   50 mg at 03/30/19 2000  . morphine 2 MG/ML injection 2 mg  2 mg Intravenous Q4H PRN Mansy, Jan A, MD   2 mg at 03/30/19 1713  . nitroGLYCERIN (NITROSTAT) SL tablet 0.4 mg  0.4 mg Sublingual Q5 min PRN Lang Snow, NP      . omega-3 acid ethyl esters (LOVAZA) capsule 1,000 mg  1,000 mg Oral Daily Lang Snow, NP   1,000 mg at 03/30/19 0847  . opium-belladonna (B&O SUPPRETTES) 16.2-60 MG suppository 1 suppository  1 suppository Rectal Q8H PRN Stark Jock Jude, MD   1 suppository at 03/31/19 0835  . pantoprazole (PROTONIX) EC tablet 40 mg  40 mg Oral Daily Lang Snow, NP   40 mg at 03/30/19 0846  . PARoxetine (PAXIL) tablet 20 mg  20 mg Oral Daily Lang Snow, NP   20 mg at 03/30/19 0906  . PHENobarbital (LUMINAL) tablet 194.4 mg  194.4 mg Oral Daily Lang Snow, NP   194.4 mg at 03/30/19 0846  . polyethylene glycol (MIRALAX / GLYCOLAX) packet 17 g  17 g Oral Daily PRN Henreitta Leber, MD   17 g at 03/28/19 0828  . potassium chloride SA (KLOR-CON) CR tablet 40 mEq  40 mEq Oral Once Ojie, Jude, MD      . sodium chloride flush (NS) 0.9 % injection 10-40 mL  10-40 mL Intracatheter Q12H Henreitta Leber, MD   10 mL at 03/30/19 2006  . sodium chloride flush (NS) 0.9 % injection 10-40 mL  10-40 mL Intracatheter PRN Henreitta Leber, MD      . tamsulosin (FLOMAX) capsule 0.4 mg  0.4 mg Oral Daily Ojie, Jude, MD   0.4 mg at 03/30/19 0847  . vancomycin (VANCOCIN) 1,250 mg in sodium chloride 0.9 % 250 mL IVPB  1,250 mg Intravenous Q24H Orene Desanctis, [facility]      . vitamin B-12 (CYANOCOBALAMIN) tablet 2,000 mcg  2,000 mcg Oral Daily Lang Snow, NP    2,000 mcg at 03/30/19 [id number]     Discharge Medications: Please see discharge summary for a list of discharge medications.  Relevant Imaging Results:  Relevant Lab Results:   Additional Information SS#  [id number]  Shelbie Hutching, RN

## 2019-03-31 NOTE — Progress Notes (Signed)
Inpatient Diabetes Program Recommendations  AACE/ADA: New Consensus Statement on Inpatient Glycemic Control   Target Ranges:  Prepandial:   less than 140 mg/dL      Peak postprandial:   less than 180 mg/dL (1-2 hours)      Critically ill patients:  140 - 180 mg/dL   Results for Mike [name], Mike [name] (MRN [id number]) as of 03/31/2019 09:42  Ref. Range 03/30/2019 07:45 03/30/2019 12:55 03/30/2019 17:15 03/30/2019 21:03 03/31/2019 07:49  Glucose-Capillary Latest Ref Range: 70 - 99 mg/dL 191 (H) 312 (H) 286 (H) 237 (H) 195 (H)   Review of Glycemic Control  Diabetes history: DM2 Outpatient Diabetes medications: Amaryl 4 mg QAM, Amaryl 2 mg QPM, Actos 30 mg daily Current orders for Inpatient glycemic control: Novolog 0-9 units TID with meals, Novolog 0-5 units QHS  Inpatient Diabetes Program Recommendations:   Insulin-Basal: Please consider ordering Lantus 5 units daily.  Insulin-Meal Coverage: Please consider ordering Novolog 3 units TID with meals for meal coverage if patient eats at least 50% of meals.  Thanks, Barnie Alderman, RN, MSN, CDE Diabetes Coordinator Inpatient Diabetes Program [phone] (Team Pager from 8am to 5pm)

## 2019-03-31 NOTE — Progress Notes (Signed)
Hematology/Oncology Consult note [facility]  [phone] [phone]  Patient Care Team: Adin Hector, MD as PCP - General (Internal Medicine)   Name of the patient: Mike [name]  [id number]  1945-11-11   Date of visit: 03/31/2019   Interval history- appears comfortable in his chair. Denies any overt complaints   Review of systems- Review of Systems  Unable to perform ROS: Mental acuity       Allergies  Allergen Reactions   Naproxen Sodium Other (See Comments)    Reaction: Upset stomach   Nsaids Other (See Comments)    Other reaction(s): Unknown   Aleve [Naproxen] Nausea And Vomiting   Ibuprofen Nausea Only    Reaction: Upset stomach      Past Medical History:  Diagnosis Date   Anxiety    Diabetes mellitus without complication ([place])    Epilepsy ([place])    High cholesterol    Hypertension    Prostate cancer ([place]) 2004   prostate removed     Past Surgical History:  Procedure Laterality Date   CARDIAC SURGERY     IRRIGATION AND DEBRIDEMENT FOOT Right 06/14/2018   Procedure: IRRIGATION AND DEBRIDEMENT FOOT;  Surgeon: Sharlotte Alamo, DPM;  Location: [facility] ORS;  Service: Podiatry;  Laterality: Right;    Social History   Socioeconomic History   Marital status: Single    Spouse name: Not on file   Number of children: Not on file   Years of education: Not on file   Highest education level: Not on file  Occupational History   Not on file  Social Needs   Financial resource strain: Not on file   Food insecurity    Worry: Not on file    Inability: Not on file   Transportation needs    Medical: Not on file    Non-medical: Not on file  Tobacco Use   Smoking status: Never Smoker   Smokeless tobacco: Never Used  Substance and Sexual Activity   Alcohol use: No   Drug use: No   Sexual activity: Not Currently  Lifestyle   Physical activity    Days per week: Not on file    Minutes per  session: Not on file   Stress: Not on file  Relationships   Social connections    Talks on phone: Not on file    Gets together: Not on file    Attends religious service: Not on file    Active member of club or organization: Not on file    Attends meetings of clubs or organizations: Not on file    Relationship status: Not on file   Intimate partner violence    Fear of current or ex partner: Not on file    Emotionally abused: Not on file    Physically abused: Not on file    Forced sexual activity: Not on file  Other Topics Concern   Not on file  Social History Narrative   Not on file    No family history on file.   Current Facility-Administered Medications:    0.9 %  sodium chloride infusion, , Intravenous, Continuous, [name], Bing Neighbors, NP, Last Rate: 75 mL/hr at 03/31/19 1035   acetaminophen (TYLENOL) tablet 650 mg, 650 mg, Oral, Q6H PRN, 650 mg at 03/31/19 1545 **OR** acetaminophen (TYLENOL) suppository 650 mg, 650 mg, Rectal, Q6H PRN, [name], Bing Neighbors, NP   acetaminophen-codeine (TYLENOL #3) 300-30 MG per tablet 1 tablet, 1 tablet, Oral, Q8H PRN, [name],  Bing Neighbors, NP   ALPRAZolam Duanne Moron) tablet 0.5-1 mg, 0.5-1 mg, Oral, BID, [name], Bing Neighbors, NP, 1 mg at 03/31/19 1040   aspirin EC tablet 81 mg, 81 mg, Oral, Daily, [name], Bing Neighbors, NP, 81 mg at 03/31/19 1048   atorvastatin (LIPITOR) tablet 20 mg, 20 mg, Oral, Daily, [name], Bing Neighbors, NP, 20 mg at 03/30/19 1716   Chlorhexidine Gluconate Cloth 2 % PADS 6 each, 6 each, Topical, Daily, Ojie, Jude, MD, 6 each at 03/30/19 0848   cholecalciferol (VITAMIN D3) tablet 1,000 Units, 1,000 Units, Oral, Daily, Lang Snow, NP, 1,000 Units at 03/31/19 1047   docusate sodium (COLACE) capsule 100 mg, 100 mg, Oral, BID PRN, Verdell Carmine, Belia Heman, MD   furosemide (LASIX) tablet 40 mg, 40 mg, Oral, Daily, Sainani, Vivek J, MD, 40 mg at 03/31/19 1040   gabapentin (NEURONTIN) capsule  1,200 mg, 1,200 mg, Oral, QHS, [name], Bing Neighbors, NP, 1,200 mg at 03/30/19 2000   gabapentin (NEURONTIN) capsule 300 mg, 300 mg, Oral, Daily, Sainani, Vivek J, MD, 300 mg at 03/31/19 1040   insulin aspart (novoLOG) injection 0-5 Units, 0-5 Units, Subcutaneous, QHS, [name], Bing Neighbors, NP, 2 Units at 03/30/19 2205   insulin aspart (novoLOG) injection 0-9 Units, 0-9 Units, Subcutaneous, TID WC, [name], Bing Neighbors, NP, 3 Units at 03/31/19 1251   ipratropium-albuterol (DUONEB) 0.5-2.5 (3) MG/3ML nebulizer solution 3 mL, 3 mL, Nebulization, Q6H PRN, Stark Jock, Jude, MD   lacosamide (VIMPAT) 100 mg in sodium chloride 0.9 % 25 mL IVPB, 100 mg, Intravenous, Once, Leotis Pain, MD   Derrill Memo ON 04/01/2019] lacosamide (VIMPAT) tablet 100 mg, 100 mg, Oral, BID, Leotis Pain, MD   metoprolol succinate (TOPROL-XL) 24 hr tablet 50 mg, 50 mg, Oral, BID, [name], Bing Neighbors, NP, 50 mg at 03/31/19 1040   morphine 2 MG/ML injection 2 mg, 2 mg, Intravenous, Q4H PRN, Mansy, Jan A, MD, 2 mg at 03/30/19 1713   nitroGLYCERIN (NITROSTAT) SL tablet 0.4 mg, 0.4 mg, Sublingual, Q5 min PRN, [name], Bing Neighbors, NP   omega-3 acid ethyl esters (LOVAZA) capsule 1,000 mg, 1,000 mg, Oral, Daily, [name], Bing Neighbors, NP, 1,000 mg at 03/31/19 1050   opium-belladonna (B&O SUPPRETTES) 16.2-60 MG suppository 1 suppository, 1 suppository, Rectal, Q8H PRN, Ojie, Jude, MD, 1 suppository at 03/31/19 0835   pantoprazole (PROTONIX) EC tablet 40 mg, 40 mg, Oral, Daily, [name], Bing Neighbors, NP, 40 mg at 03/31/19 1040   PARoxetine (PAXIL) tablet 20 mg, 20 mg, Oral, Daily, [name], Bing Neighbors, NP, 20 mg at 03/31/19 1039   [START ON 04/01/2019] PHENObarbital (LUMINAL) injection 65 mg, 65 mg, Intravenous, BID, Leotis Pain, MD   [START ON 04/03/2019] PHENObarbital (LUMINAL) injection 65 mg, 65 mg, Intravenous, Once, Leotis Pain, MD   polyethylene glycol (MIRALAX / GLYCOLAX) packet 17 g,  17 g, Oral, Daily PRN, Henreitta Leber, MD, 17 g at 03/28/19 0828   sodium chloride flush (NS) 0.9 % injection 10-40 mL, 10-40 mL, Intracatheter, Q12H, Sainani, Vivek J, MD, 10 mL at 03/30/19 2006   sodium chloride flush (NS) 0.9 % injection 10-40 mL, 10-40 mL, Intracatheter, PRN, Henreitta Leber, MD   tamsulosin (FLOMAX) capsule 0.4 mg, 0.4 mg, Oral, Daily, Ojie, Jude, MD, 0.4 mg at 03/31/19 1054   vitamin B-12 (CYANOCOBALAMIN) tablet 2,000 mcg, 2,000 mcg, Oral, Daily, Lang Snow, NP, 2,000 mcg at 03/31/19 1049  Physical exam:  Vitals:   03/31/19 0408 03/31/19 1100 03/31/19 1419 03/31/19 1538  BP: (!) 145/66  (!) 145/59  Pulse: 83  87   Resp: 18  18   Temp: 98.8 F (37.1 C) 98.8 F (37.1 C) 99.7 F (37.6 C) (!) 100.9 F (38.3 C)  TempSrc: Oral  Oral Oral  SpO2: 95%  97%   Weight:      Height:       Physical Exam Constitutional:      Appearance: He is obese.  HENT:     Head: Normocephalic and atraumatic.  Eyes:     Pupils: Pupils are equal, round, and reactive to light.  Neck:     Musculoskeletal: Normal range of motion.  Cardiovascular:     Rate and Rhythm: Normal rate and regular rhythm.     Heart sounds: Normal heart sounds.  Pulmonary:     Effort: Pulmonary effort is normal.     Breath sounds: Normal breath sounds.  Abdominal:     General: Bowel sounds are normal.     Palpations: Abdomen is soft.  Skin:    General: Skin is warm and dry.  Neurological:     Mental Status: He is alert.     Comments: Oriented to self only      CMP Latest Ref Rng & Units 03/31/2019  Glucose 70 - 99 mg/dL 223(H)  BUN 8 - 23 mg/dL 15  Creatinine 0.61 - 1.24 mg/dL 0.86  Sodium 135 - 145 mmol/L 134(L)  Potassium 3.5 - 5.1 mmol/L 3.4(L)  Chloride 98 - 111 mmol/L 101  CO2 22 - 32 mmol/L 25  Calcium 8.9 - 10.3 mg/dL 7.8(L)  Total Protein 6.5 - 8.1 g/dL -  Total Bilirubin 0.3 - 1.2 mg/dL -  Alkaline Phos 38 - 126 U/L -  AST 15 - 41 U/L -  ALT 0 - 44 U/L -    CBC Latest Ref Rng & Units 03/31/2019  WBC 4.0 - 10.5 K/uL 2.8(L)  Hemoglobin 13.0 - 17.0 g/dL 7.3(L)  Hematocrit 39.0 - 52.0 % 22.9(L)  Platelets 150 - 400 K/uL 108(L)    '@IMAGES' @  Ct Abdomen Pelvis Wo Contrast  Result Date: 03/28/2019 CLINICAL DATA:  Abdominal pain and fever.  Rule out abscess. EXAM: CT ABDOMEN AND PELVIS WITHOUT CONTRAST TECHNIQUE: Multidetector CT imaging of the abdomen and pelvis was performed following the standard protocol without IV contrast. COMPARISON:  None. FINDINGS: Lower chest: Lung bases clear bilaterally. Hepatobiliary: No focal liver abnormality is seen. No gallstones, gallbladder wall thickening, or biliary dilatation. Pancreas: Negative Spleen: Negative Adrenals/Urinary Tract: No renal calculi. No renal mass. Mild fullness of the renal collecting system due to markedly distended urinary bladder. Prior prostatectomy. No bladder mass. Stomach/Bowel: Normal stomach and duodenum. Negative for bowel obstruction. No bowel mass or edema. Appendix not visualized. Vascular/Lymphatic: Negative Reproductive: Prostatectomy.  No pelvic mass Other: Negative for abscess Musculoskeletal: Mild degenerative changes in the lumbar spine. No acute skeletal abnormality. IMPRESSION: Markedly distended urinary bladder. Prior prostatectomy for cancer. No mass lesion in the pelvis. Negative for abscess or source of infection. Electronically Signed   By: Franchot Gallo M.D.   On: 03/28/2019 16:53   Dg Chest 1 View  Result Date: 03/30/2019 CLINICAL DATA:  Fever. EXAM: CHEST  1 VIEW COMPARISON:  March 26, 2019. FINDINGS: The heart size and mediastinal contours are within normal limits. Both lungs are clear. The visualized skeletal structures are unremarkable. IMPRESSION: No active disease. Electronically Signed   By: Marijo Conception M.D.   On: 03/30/2019 08:47   Dg Chest 1 View  Result Date: 03/26/2019 CLINICAL DATA:  UNWITNESSED FALL EXAM: CHEST  1 VIEW COMPARISON:  January 07, 2018  FINDINGS: There is mild cardiomegaly. Overlying median sternotomy wires. Both lungs are clear. 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 IMPRESSION: No acute cardiopulmonary process. Electronically Signed   By: Prudencio Pair M.D.   On: 03/26/2019 21:03   Dg Lumbar Spine 2-3 Views  Result Date: 03/28/2019 CLINICAL DATA:  Recent fall at home. Pt does not remember falling. No known previous injury or surgery to pelvis or lumbar spine. EXAM: LUMBAR SPINE - 2-3 VIEW COMPARISON:  None. FINDINGS: There is no evidence of lumbar spine fracture. Alignment is normal. Intervertebral disc spaces are maintained. Flowing anterior osteophytosis. Gaseous distension of bowel loops throughout the abdomen. No acute finding in the visualized pelvis. SI joints are open. IMPRESSION: No acute osseous abnormality in the lumbar spine Electronically Signed   By: Audie Pinto M.D.   On: 03/28/2019 17:05   Dg Pelvis 1-2 Views  Result Date: 03/28/2019 CLINICAL DATA:  Recent fall at home. Pt does not remember falling. No known previous injury or surgery to pelvis or lumbar spine. EXAM: PELVIS - 1-2 VIEW COMPARISON:  Pelvis radiographs 03/23/2010 FINDINGS: There is no evidence of pelvic fracture or diastasis. Hips appear normally aligned. No focal bony lesion. Bilateral postoperative clips. No pelvic bone lesions are seen. Regional soft tissues unremarkable. IMPRESSION: Negative pelvis radiographs. Electronically Signed   By: Audie Pinto M.D.   On: 03/28/2019 17:07   Dg Shoulder Right  Result Date: 03/30/2019 CLINICAL DATA:  Right shoulder pain without trauma. EXAM: RIGHT SHOULDER - 2+ VIEW COMPARISON:  06/05/2018 FINDINGS: No acute fracture or dislocation. Visualized portion of the right hemithorax is normal. Median sternotomy. Mild degradation secondary to positioning. IMPRESSION: No acute osseous abnormality. Electronically Signed   By: Abigail Miyamoto M.D.   On: 03/30/2019 14:55   Ct Head Wo Contrast  Result Date:  03/26/2019 CLINICAL DATA:  Head trauma, unwitnessed fall EXAM: CT HEAD WITHOUT CONTRAST TECHNIQUE: Contiguous axial images were obtained from the base of the skull through the vertex without intravenous contrast. COMPARISON:  February 25, 2019 FINDINGS: Brain: No evidence of acute territorial infarction, hemorrhage, hydrocephalus,extra-axial collection or mass lesion/mass effect. There is mild dilatation the ventricles and sulci consistent with age-related atrophy. Low-attenuation changes in the deep white matter consistent with small vessel ischemia. Vascular: No hyperdense vessel or unexpected calcification. Skull: The skull is intact. No fracture or focal lesion identified. Sinuses/Orbits: The visualized paranasal sinuses and mastoid air cells are clear. The orbits and globes intact. Other: None IMPRESSION: No acute intracranial abnormality. Findings consistent with age related atrophy and chronic small vessel ischemia Electronically Signed   By: Prudencio Pair M.D.   On: 03/26/2019 22:36     Assessment and plan- Patient is a 73 y.o. male with history of seizure disorder admitted for sepsis complicated by pancytopenia for which hematology was consulted   1.  Leukopenia and thrombocytopenia: Patient had a normal white cell count up until February 2020.  On 02/24/2019 he had a low white count of 3.6 and since his admission his white cell count has been between 2.8-3.  Differential mainly shows neutropenia.  He was also noted to have a mild thrombocytopenia 148 on admission which has slowly drifted down and has now stabilized around 108.  I suspect his acute leukopenia and thrombocytopenia is due to acute sepsis.  Peripheral smear also shows mild left shift along with metamyelocytes and occasional bands which can be seen in sepsis.  Given his pancytopenia  and ongoing fever with unclear etiology of sepsis-it would be okay to proceed with a bone marrow biopsy tomorrow to a certain further etiology although I still  suspect that his acute sepsis is the likely etiology. Patient unable to make decisions presently. Will speak to his sister if she is agreeable.   2.  Microcytic anemia: Patient has had chronic microcytic anemia at least since February 2020 and since this year his hemoglobin has been around 8 and his MCV is typically in the low 60s.  Anemia work-up so far has been unrevealing including iron studies which have been borderline low and IV iron can be considered.  Ferritin levels are high but can be falsely high in an acute phase.  B12 folate normal.  Myeloma panel Was unremarkable.  Flow cytometry is currently pending.  No evidence of hemolysis.  LDH mildly elevated at 315 haptoglobin is normal.  Reticulocyte count is low indicative of a hypoproliferative anemia.  Plan for bone marrow biopsy as above if sister can consent    Visit Diagnosis 1. Fall, initial encounter   2. Fever   3. Altered mental status, unspecified altered mental status type   4. Sepsis, due to unspecified organism, unspecified whether acute organ dysfunction present ([place])   5. Fall   6. Pain      Dr. Randa Evens, MD, MPH [facility] [phone] 03/31/2019 4:47 PM
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
## 2019-03-31 NOTE — Progress Notes (Signed)
[facility] [place] NAME: Mike [name]    MR#:  [id number]  DATE OF BIRTH:  23-Mar-1946  SUBJECTIVE:  Patient had fevers last night with temperature of 103.1.  Complains of intermittent pains.  Unable to localize any specific location.  When examined no evidence of tenderness globally.  No bleeding.  Updated sister present at bedside and treatment plans. Seen by infectious disease with further recommendations as outlined below today.  REVIEW OF SYSTEMS:    Review of Systems  Constitutional: Positive for fever. Negative for chills.  HENT: Negative for congestion and tinnitus.   Eyes: Negative for blurred vision and double vision.  Respiratory: Negative for cough, shortness of breath and wheezing.   Cardiovascular: Negative for chest pain, orthopnea and PND.  Gastrointestinal: Negative for abdominal pain, diarrhea, nausea and vomiting.  Genitourinary: Negative for dysuria and hematuria.  Musculoskeletal: Negative for falls.       Right shoulder pain  Neurological: Positive for weakness (generalized). Negative for dizziness, sensory change and focal weakness.  All other systems reviewed and are negative.   DRUG ALLERGIES:   Allergies  Allergen Reactions   Naproxen Sodium Other (See Comments)    Reaction: Upset stomach   Nsaids Other (See Comments)    Other reaction(s): Unknown   Aleve [Naproxen] Nausea And Vomiting   Ibuprofen Nausea Only    Reaction: Upset stomach     VITALS:  Blood pressure (!) 145/59, pulse 87, temperature 99.7 F (37.6 C), temperature source Oral, resp. rate 18, height 5' 7" (1.702 m), weight 90 kg, SpO2 97 %.  PHYSICAL EXAMINATION:   Physical Exam  GENERAL:  73 year old obese patient patient lying in bed in no acute distress.  EYES: Pupils equal, round, reactive to light and accommodation. No scleral icterus. Extraocular muscles intact.  HEENT: Head atraumatic, normocephalic. Oropharynx and nasopharynx  clear.  NECK:  Supple, no jugular venous distention. No thyroid enlargement, no tenderness.  LUNGS: Normal breath sounds bilaterally, no wheezing, rales, rhonchi. No use of accessory muscles of respiration.  CARDIOVASCULAR: S1, S2 normal. No murmurs, rubs, or gallops.  ABDOMEN: Soft, nontender, nondistended. Bowel sounds present. No organomegaly or mass.  EXTREMITIES: No cyanosis, clubbing or edema b/l.    NEUROLOGIC: Cranial nerves II through XII are intact. No focal Motor or sensory deficits b/l.  Globally weak.  PSYCHIATRIC: The patient is alert and oriented x 3.  SKIN: No obvious rash, lesion, or ulcer.   Rectum - Redness around the per-rectal and area.    LABORATORY PANEL:   CBC Recent Labs  Lab 03/31/19 0914  WBC 2.8*  HGB 7.3*   7.2*  HCT 22.9*   22.9*  PLT 108*   ------------------------------------------------------------------------------------------------------------------  Chemistries  Recent Labs  Lab 03/26/19 2012  03/31/19 0628  NA 138   < > 134*  K 4.4   < > 3.4*  CL 100   < > 101  CO2 27   < > 25  GLUCOSE 197*   < > 223*  BUN 30*   < > 15  CREATININE 1.43*   < > 0.86  CALCIUM 9.5   < > 7.8*  MG  --   --  1.6*  AST 20  --   --   ALT 14  --   --   ALKPHOS 116  --   --   BILITOT 0.9  --   --    < > = values in this interval not  displayed.   ------------------------------------------------------------------------------------------------------------------  Cardiac Enzymes No results for input(s): TROPONINI in the last 168 hours. ------------------------------------------------------------------------------------------------------------------  RADIOLOGY:  Dg Chest 1 View  Result Date: 03/30/2019 CLINICAL DATA:  Fever. EXAM: CHEST  1 VIEW COMPARISON:  March 26, 2019. FINDINGS: The heart size and mediastinal contours are within normal limits. Both lungs are clear. The visualized skeletal structures are unremarkable. IMPRESSION: No active disease.  Electronically Signed   By: Marijo Conception M.D.   On: 03/30/2019 08:47   Dg Shoulder Right  Result Date: 03/30/2019 CLINICAL DATA:  Right shoulder pain without trauma. EXAM: RIGHT SHOULDER - 2+ VIEW COMPARISON:  06/05/2018 FINDINGS: No acute fracture or dislocation. Visualized portion of the right hemithorax is normal. Median sternotomy. Mild degradation secondary to positioning. IMPRESSION: No acute osseous abnormality. Electronically Signed   By: Abigail Miyamoto M.D.   On: 03/30/2019 14:55     ASSESSMENT AND PLAN:   73 year old male with past medical history of diabetes, seizures, hypertension, hyperlipidemia, history of prostate cancer, anxiety, GERD, history of coronary disease to mitral valve regurgitation who presented to the hospital due to fever, fall and shortness of breath.  1.  Sepsis-patient met criteria on admission given his fever, tachycardia recently. -Source of sepsis remains unclear.  Recent chest x-ray is negative for acute pneumonia, urinalysis is negative, patient's COVID-19 test is negative. - MRSA PCR negative.  Patient has been placed on broad-spectrum IV antibiotics including vancomycin , cefepime and Flagyl but patient continued to spike fevers.  No evidence of any meningeal signs.  So far no evidence of infectious process found. Consulted infectious disease specialist Dr. Ramon Dredge saw patient today thinks fever does not appear to be infectious.  Infectious disease specialist feels the fevers is most likely related to phenobarbital which patient takes for seizures.  Recommended discussing with patient's physicians at [facility] to try to wean off phenobarbital.  I discussed with patient's sister about recommendations who confirmed patient has no prior psychiatric history.  Patient is on the phenobarbital for seizures since childhood.  Patient's doctor at [facility] has retired and patient has been followed up at [facility] clinic with Dr. Caryl Comes over the last 1 year.  Called and discussed  case with primary care physician Dr. Caryl Comes who also has been considering gradually getting patient off of phenobarbital.  He agrees with getting neurology to assist with weaning patient off phenobarbital and initiating a different seizure medication.  2.  Pancytopenia- etiology unclear presently. Stable -Seen by hematology oncology and as per them patient has a baseline microcytic anemia now has poor reserves and therefore possibly could be underlying iron deficiency. -No evidence of hemolysis based on blood work.  No plans for bone marrow biopsy presently. -Continue supportive care with transfusions as needed.  Follow serial counts.  Flow cytometry pending.  History of prostate cancer.  Last PSA documented 0.92. Patient was evaluated by hematologist and given patient's microcytic anemia and no GI evaluation since 2004, she recommended getting GI consult.  Patient seen by Dr. Vicente Males with plans to consider EGD and colonoscopy after fever is resolved.  This can be done as inpatient or outpatient.  3. Abdominal/Rectal/Lower back pain -patient had a fall prior to admission.   Due to complaints of abdominal pains yesterday patient had CT abdomen and pelvis done with no acute findings apart from distended urinary bladder which is being managed as outlined below.  X-ray of the lumbar spine and pelvis negative for any acute findings. This morning patient complained of right shoulder  pain.  X-rays of right shoulder with no acute findings. Discussed with sister at bedside this morning with goal to try to wean off narcotic as tolerated.  4.  History of seizures- no acute seizure activity.  I have placed neurology consult to assist with weaning of phenobarbital which is thought to be causing patient's fevers.  Neurologist plans to gradually wean off phenobarbital and initiate another antiepileptic agent.  5.  GERD- continue Protonix.  6.  Anxiety- continue Paxil, Xanax.  7.  Essential hypertension- continue  Toprol.  8.  Neuropathy- continue gabapentin.  9.  Chronic Diastolic CHF - clinically not in CHF.  - cont. Lasix, Toprol.    10.  Urinary retention Noted distended urinary bladder on CT scan.  In and out catheterization done recently with about 1.5 L of urine output.  Yesterday evening patient had repeat urinary retention and Foley catheter was placed and left in place with at least 1.3 L of urine output.  Flomax already initiated yesterday. I discussed case urologist on call today Dr. Jeffie Pollock who recommended leaving Foley catheter in place continue Flomax which recommendation to follow-up with urology clinic in 1 week for possible voiding trial. Patient appears to be having some bladder spasm.  Placed on B&O suppositories as needed.  Physical therapy working with patient.  Recommended skilled nursing facility placement on discharge.  DVT prophylaxis; SCDs No heparin products due to thrombocytopenia  All the records are reviewed and case discussed with Care Management/Social Worker. Management plans discussed with the patient, family and they are in agreement. Updated patient sister present at bedside on treatment plans and all questions were answered.  CODE STATUS: Full code  DVT Prophylaxis: Ted's & SCD's.   TOTAL TIME TAKING CARE OF THIS PATIENT: 32 minutes.   POSSIBLE D/C IN 3DAYS, DEPENDING ON CLINICAL CONDITION.   Jahree Dermody M.D on 03/31/2019 at 2:25 PM  Between 7am to 6pm - Pager - [phone]  After 6pm go to [email or web address] - Proofreader  [facility]  Office  [phone]  CC: Primary care physician; Adin Hector, MD

## 2019-03-31 NOTE — Consult Note (Signed)
NAME: Mike [name]  DOB: 1946/05/08  MRN: [id number]  Date/Time: 03/31/2019 10:56 AM  REQUESTING PROVIDER: Dr. Stark Jock Subjective:  REASON FOR CONSULT: Fever of unknown origin ?Patient not a reliable historian.  Chart reviewed thoroughly.  Spoke to his sister Mike [name] who was at bedside. Mike [name] is a 73 y.o. male with a history of coronary artery disease status post bypass, diabetes, depression, hypertension, seizure disorder, history of prostate cancer with prior prostatectomy, Diabetes Patient presented to the ED on 03/26/2019 after a fall.  He was walking in his home and fell and landed on his back.  He could not remember how that happened. Vitals in the ED was temperature of 102.4, blood pressure of 143/103, heart rate of 132.  Labs revealed a glucose of 197, creatinine of 1.43, lactate of 2.2, WBC of 2.8, hemoglobin of 7.9, MCV of 69.1, platelet of 148.  Cover test was negative.  UA was negative.  Noncontrast CT head was obtained and showed no acute intracranial abnormality.  Chest x-ray was not remarkable.  After blood cultures were sent he was started on vancomycin cefepime and metronidazole.  Patient had a similar presentation on 02/24/2019 with staggering and and being unsteady on his gait and falling and hitting his head and forehead on the chair. He went to his PCP and had echo done on 03/06/2019 which showed moderate mitral valve regurgitation, EF of 50%  Patient has been weak for the last few months.  He has had dizziness and falls in the last few months.  His appetite is poor.  He has not lost any weight.  He has not noticed any fever at home.  He has had a chronic cough for many years and it is thought to be nocturnal and relieved by oxycodone and Tylenol as per the [facility] geriatrics note.  His PCP is Dr. Caryl Comes at [facility]. Of late he has been anemic. Fever curve in the hospital  Medical history CAD Hypertension Anxiety/depression Seizure disorder Prostate cancer status  post prostatectomy Diabetes mellitus with peripheral neuropathy GERD Gout Hyperlipidemia Cervical disc disease Anemia Chronic cough.  Extensive evaluation at [facility] Puncture wound of right foot/cellulitis of right foot followed by podiatrist  Past surgical history Coronary artery bypass grafting 2006 Prostatectomy 2004 Irrigation debridement of left foot 2019   Social history Single Never smoked Drinks a can of beer  at night sometimes no substance use.   Family history Breast cancer mother Emphysema Father Allergies  Allergen Reactions  . Naproxen Sodium Other (See Comments)    Reaction: Upset stomach  . Nsaids Other (See Comments)    Other reaction(s): Unknown  . Aleve [Naproxen] Nausea And Vomiting  . Ibuprofen Nausea Only    Reaction: Upset stomach    Home meds Xanax as needed Atorvastatin Vitamin D3 Cyanocobalamin Fish oil Torsemide Gabapentin Glimepiride Atarax as needed Avapro 300 mg once a day (irbesartan) Metoprolol Paroxetine Phenobarbital Pioglitazone Potassium chloride  Current Facility-Administered Medications  Medication Dose Route Frequency Provider Last Rate Last Dose  . 0.9 %  sodium chloride infusion   Intravenous Continuous Lang Snow, NP 75 mL/hr at 03/31/19 1035    . acetaminophen (TYLENOL) tablet 650 mg  650 mg Oral Q6H PRN Lang Snow, NP   650 mg at 03/30/19 2000   Or  . acetaminophen (TYLENOL) suppository 650 mg  650 mg Rectal Q6H PRN Lang Snow, NP      . acetaminophen-codeine (TYLENOL #3) 300-30 MG per tablet 1 tablet  1 tablet Oral Q8H PRN Lang Snow, NP      . ALPRAZolam Duanne Moron) tablet 0.5-1 mg  0.5-1 mg Oral BID Lang Snow, NP   1 mg at 03/31/19 1040  . aspirin EC tablet 81 mg  81 mg Oral Daily Lang Snow, NP   81 mg at 03/31/19 1048  . atorvastatin (LIPITOR) tablet 20 mg  20 mg Oral Daily Lang Snow, NP   20 mg at 03/30/19 1716  . ceFEPIme  (MAXIPIME) 2 g in sodium chloride 0.9 % 100 mL IVPB  2 g Intravenous Q8H Orene Desanctis, RPH 200 mL/hr at 03/31/19 0521 2 g at 03/31/19 0521  . Chlorhexidine Gluconate Cloth 2 % PADS 6 each  6 each Topical Daily Stark Jock, Jude, MD   6 each at 03/30/19 0848  . cholecalciferol (VITAMIN D3) tablet 1,000 Units  1,000 Units Oral Daily Lang Snow, NP   1,000 Units at 03/31/19 1047  . docusate sodium (COLACE) capsule 100 mg  100 mg Oral BID PRN Henreitta Leber, MD      . furosemide (LASIX) tablet 40 mg  40 mg Oral Daily Henreitta Leber, MD   40 mg at 03/31/19 1040  . gabapentin (NEURONTIN) capsule 1,200 mg  1,200 mg Oral QHS Lang Snow, NP   1,200 mg at 03/30/19 2000  . gabapentin (NEURONTIN) capsule 300 mg  300 mg Oral Daily Henreitta Leber, MD   300 mg at 03/31/19 1040  . insulin aspart (novoLOG) injection 0-5 Units  0-5 Units Subcutaneous QHS Lang Snow, NP   2 Units at 03/30/19 2205  . insulin aspart (novoLOG) injection 0-9 Units  0-9 Units Subcutaneous TID WC Lang Snow, NP   2 Units at 03/31/19 0825  . ipratropium-albuterol (DUONEB) 0.5-2.5 (3) MG/3ML nebulizer solution 3 mL  3 mL Nebulization Q6H PRN Ojie, Jude, MD      . magnesium sulfate IVPB 2 g 50 mL  2 g Intravenous Once Ojie, Jude, MD 50 mL/hr at 03/31/19 1039 2 g at 03/31/19 1039  . metoprolol succinate (TOPROL-XL) 24 hr tablet 50 mg  50 mg Oral BID Lang Snow, NP   50 mg at 03/31/19 1040  . morphine 2 MG/ML injection 2 mg  2 mg Intravenous Q4H PRN Mansy, Jan A, MD   2 mg at 03/30/19 1713  . nitroGLYCERIN (NITROSTAT) SL tablet 0.4 mg  0.4 mg Sublingual Q5 min PRN Lang Snow, NP      . omega-3 acid ethyl esters (LOVAZA) capsule 1,000 mg  1,000 mg Oral Daily Lang Snow, NP   1,000 mg at 03/31/19 1050  . opium-belladonna (B&O SUPPRETTES) 16.2-60 MG suppository 1 suppository  1 suppository Rectal Q8H PRN Stark Jock Jude, MD   1 suppository at 03/31/19 0835  .  pantoprazole (PROTONIX) EC tablet 40 mg  40 mg Oral Daily Lang Snow, NP   40 mg at 03/31/19 1040  . PARoxetine (PAXIL) tablet 20 mg  20 mg Oral Daily Lang Snow, NP   20 mg at 03/31/19 1039  . PHENobarbital (LUMINAL) tablet 194.4 mg  194.4 mg Oral Daily Lang Snow, NP   194.4 mg at 03/31/19 1040  . polyethylene glycol (MIRALAX / GLYCOLAX) packet 17 g  17 g Oral Daily PRN Henreitta Leber, MD   17 g at 03/28/19 0828  . sodium chloride flush (NS) 0.9 % injection 10-40 mL  10-40 mL Intracatheter Q12H Sainani, Belia Heman, MD  10 mL at 03/30/19 2006  . sodium chloride flush (NS) 0.9 % injection 10-40 mL  10-40 mL Intracatheter PRN Henreitta Leber, MD      . tamsulosin (FLOMAX) capsule 0.4 mg  0.4 mg Oral Daily Ojie, Jude, MD   0.4 mg at 03/31/19 1054  . vancomycin (VANCOCIN) 1,250 mg in sodium chloride 0.9 % 250 mL IVPB  1,250 mg Intravenous Q24H Orene Desanctis, [facility]      . vitamin B-12 (CYANOCOBALAMIN) tablet 2,000 mcg  2,000 mcg Oral Daily Lang Snow, NP   2,000 mcg at 03/31/19 1049     Abtx:  Anti-infectives (From admission, onward)   Start     Dose/Rate Route Frequency Ordered Stop   03/31/19 2300  vancomycin (VANCOCIN) 1,250 mg in sodium chloride 0.9 % 250 mL IVPB     1,250 mg 166.7 mL/hr over 90 Minutes Intravenous Every 24 hours 03/30/19 2203     03/30/19 2200  vancomycin (VANCOCIN) 2,000 mg in sodium chloride 0.9 % 500 mL IVPB     2,000 mg 250 mL/hr over 120 Minutes Intravenous  Once 03/30/19 2152 03/31/19 0221   03/30/19 2200  ceFEPIme (MAXIPIME) 2 g in sodium chloride 0.9 % 100 mL IVPB     2 g 200 mL/hr over 30 Minutes Intravenous Every 8 hours 03/30/19 2159     03/30/19 0900  ampicillin-sulbactam (UNASYN) 1.5 g in sodium chloride 0.9 % 100 mL IVPB  Status:  Discontinued     1.5 g 200 mL/hr over 30 Minutes Intravenous Every 6 hours 03/30/19 0803 03/30/19 2114   03/29/19 1400  ceFEPIme (MAXIPIME) 2 g in sodium chloride 0.9 % 100 mL  IVPB  Status:  Discontinued     2 g 200 mL/hr over 30 Minutes Intravenous Every 8 hours 03/29/19 1147 03/29/19 1355   03/29/19 1400  metroNIDAZOLE (FLAGYL) tablet 500 mg  Status:  Discontinued     500 mg Oral Every 8 hours 03/29/19 1149 03/29/19 1355   03/27/19 1100  vancomycin (VANCOCIN) IVPB 1000 mg/200 mL premix  Status:  Discontinued     1,000 mg 200 mL/hr over 60 Minutes Intravenous Every 12 hours 03/27/19 0630 03/27/19 1351   03/27/19 0900  ceFEPIme (MAXIPIME) 2 g in sodium chloride 0.9 % 100 mL IVPB  Status:  Discontinued     2 g 200 mL/hr over 30 Minutes Intravenous Every 12 hours 03/27/19 0003 03/29/19 1147   03/27/19 0600  metroNIDAZOLE (FLAGYL) IVPB 500 mg  Status:  Discontinued     500 mg 100 mL/hr over 60 Minutes Intravenous Every 8 hours 03/27/19 0003 03/29/19 1149   03/27/19 0015  vancomycin (VANCOCIN) IVPB 1000 mg/200 mL premix  Status:  Discontinued     1,000 mg 200 mL/hr over 60 Minutes Intravenous  Once 03/27/19 0003 03/27/19 0009   03/26/19 2100  vancomycin (VANCOCIN) 2,000 mg in sodium chloride 0.9 % 500 mL IVPB     2,000 mg 250 mL/hr over 120 Minutes Intravenous  Once 03/26/19 2055 03/27/19 0235   03/26/19 2045  ceFEPIme (MAXIPIME) 2 g in sodium chloride 0.9 % 100 mL IVPB     2 g 200 mL/hr over 30 Minutes Intravenous  Once 03/26/19 2044 03/26/19 2125   03/26/19 2045  metroNIDAZOLE (FLAGYL) IVPB 500 mg     500 mg 100 mL/hr over 60 Minutes Intravenous  Once 03/26/19 2044 03/26/19 2228   03/26/19 2045  vancomycin (VANCOCIN) IVPB 1000 mg/200 mL premix  Status:  Discontinued  1,000 mg 200 mL/hr over 60 Minutes Intravenous  Once 03/26/19 2044 03/26/19 2055      REVIEW OF SYSTEMS:  Const: negative fever, negative chills, negative weight loss Eyes: negative diplopia or visual changes, negative eye pain ENT: negative coryza, negative sore throat Resp: Chronic cough, hemoptysis, dyspnea Cards: negative for chest pain, palpitations, has lower extremity edema GU: As  per his sister he holds his urine at times. GI: Negative for abdominal pain, diarrhea, bleeding, constipation Skin: negative for rash and pruritus Heme: negative for easy bruising and gum/nose bleeding MS: Has had falls Pain in the tailbone Neurolo:negative for headaches, has dizziness, , no memory problems  Psych:  anxiety, depression  Endocrine: Diabetes Allergy/Immunology-as noted above ? Pertinent Positives include : Objective:  VITALS:  BP (!) 145/66 (BP Location: Left Arm)   Pulse 83   Temp 98.8 F (37.1 C) (Oral)   Resp 18   Ht 5\' 7"  (1.702 m)   Wt 90 kg   SpO2 95%   BMI 31.08 kg/m  PHYSICAL EXAM:  General: Awake, as per sister he is lethargic, responds to commands appropriately but says I do not know for most of the questions.  Not in any distress..  Head: Normocephalic, without obvious abnormality, atraumatic. Eyes: Left ectropion ENT Nares normal. No drainage or sinus tenderness. Oral mucosa dry.  Tongue coated Neck: Supple, symmetrical, no adenopathy, thyroid: non tender no carotid bruit and no JVD. Back: No CVA tenderness. Lungs: Bilateral air entry few basal crypts. Sternal scar Heart: S1-S2 Abdomen: Soft, lap scar, non-tender,not distended. Bowel sounds normal. No masses Extremities: atraumatic, no cyanosis. No edema. No clubbing Skin: No rashes or lesions. Or bruising Lymph: Cervical, supraclavicular normal. Neurologic: Grossly non-focal Pertinent Labs Lab Results CBC    Component Value Date/Time   WBC 2.7 (L) 03/31/2019 0628   RBC 3.00 (L) 03/31/2019 0628   HGB 7.2 (L) 03/31/2019 0914   HCT 22.9 (L) 03/31/2019 0914   PLT 101 (L) 03/31/2019 0628   MCV 72.0 (L) 03/31/2019 0628   MCH 23.0 (L) 03/31/2019 0628   MCHC 31.9 03/31/2019 0628   RDW 21.6 (H) 03/31/2019 0628   LYMPHSABS 1.3 03/30/2019 0614   MONOABS 0.5 03/30/2019 0614   EOSABS 0.1 03/30/2019 0614   BASOSABS 0.0 03/30/2019 0614    CMP Latest Ref Rng & Units 03/31/2019 03/29/2019  03/28/2019  Glucose 70 - 99 mg/dL 223(H) 203(H) 152(H)  BUN 8 - 23 mg/dL 15 21 25(H)  Creatinine 0.61 - 1.24 mg/dL 0.86 0.99 1.22  Sodium 135 - 145 mmol/L 134(L) 136 141  Potassium 3.5 - 5.1 mmol/L 3.4(L) 3.9 3.9  Chloride 98 - 111 mmol/L 101 106 108  CO2 22 - 32 mmol/L 25 23 23   Calcium 8.9 - 10.3 mg/dL 7.8(L) 8.1(L) 8.3(L)  Total Protein 6.5 - 8.1 g/dL - - -  Total Bilirubin 0.3 - 1.2 mg/dL - - -  Alkaline Phos 38 - 126 U/L - - -  AST 15 - 41 U/L - - -  ALT 0 - 44 U/L - - -   Procalcitonin on admission less than 0.1 Iron studies show an iron of 23 TIBC 255 saturation ratio 9 which is low ferritin is high at 425 B12 is 2930 Folate is 30.  Microbiology: Recent Results (from the past 240 hour(s))  Culture, blood (Routine x 2)     Status: None   Collection Time: 03/26/19  8:12 PM   Specimen: BLOOD  Result Value Ref Range Status   Specimen  Description BLOOD LEFT ANTECUBITAL  Final   Special Requests   Final    BOTTLES DRAWN AEROBIC AND ANAEROBIC Blood Culture adequate volume   Culture   Final    NO GROWTH 5 DAYS Performed at [facility], [address] [place], [place] [address]    Report Status 03/31/2019 FINAL  Final  Culture, blood (Routine x 2)     Status: None   Collection Time: 03/26/19  8:50 PM   Specimen: BLOOD  Result Value Ref Range Status   Specimen Description BLOOD BLOOD RIGHT HAND  Final   Special Requests   Final    BOTTLES DRAWN AEROBIC AND ANAEROBIC Blood Culture adequate volume   Culture   Final    NO GROWTH 5 DAYS Performed at [facility], [address], [place], [place] [address]    Report Status 03/31/2019 FINAL  Final  SARS Coronavirus 2 [facility] order, Performed in [facility] hospital lab) Nasopharyngeal Nasopharyngeal Swab     Status: None   Collection Time: 03/26/19  8:50 PM   Specimen: Nasopharyngeal Swab  Result Value Ref Range Status   SARS Coronavirus 2 NEGATIVE NEGATIVE Final    Comment: (NOTE) If result is  NEGATIVE SARS-CoV-2 target nucleic acids are NOT DETECTED. The SARS-CoV-2 RNA is generally detectable in upper and lower  respiratory specimens during the acute phase of infection. The lowest  concentration of SARS-CoV-2 viral copies this assay can detect is 250  copies / mL. A negative result does not preclude SARS-CoV-2 infection  and should not be used as the sole basis for treatment or other  patient management decisions.  A negative result may occur with  improper specimen collection / handling, submission of specimen other  than nasopharyngeal swab, presence of viral mutation(s) within the  areas targeted by this assay, and inadequate number of viral copies  (<250 copies / mL). A negative result must be combined with clinical  observations, patient history, and epidemiological information. If result is POSITIVE SARS-CoV-2 target nucleic acids are DETECTED. The SARS-CoV-2 RNA is generally detectable in upper and lower  respiratory specimens dur ing the acute phase of infection.  Positive  results are indicative of active infection with SARS-CoV-2.  Clinical  correlation with patient history and other diagnostic information is  necessary to determine patient infection status.  Positive results do  not rule out bacterial infection or co-infection with other viruses. If result is PRESUMPTIVE POSTIVE SARS-CoV-2 nucleic acids MAY BE PRESENT.   A presumptive positive result was obtained on the submitted specimen  and confirmed on repeat testing.  While 2019 novel coronavirus  (SARS-CoV-2) nucleic acids may be present in the submitted sample  additional confirmatory testing may be necessary for epidemiological  and / or clinical management purposes  to differentiate between  SARS-CoV-2 and other Sarbecovirus currently known to infect humans.  If clinically indicated additional testing with an alternate test  methodology [phone]) is advised. The SARS-CoV-2 RNA is generally  detectable  in upper and lower respiratory sp ecimens during the acute  phase of infection. The expected result is Negative. Fact Sheet for Patients:  StrictlyIdeas.no Fact Sheet for Healthcare Providers: BankingDealers.co.za This test is not yet approved or cleared by the [place] FDA and has been authorized for detection and/or diagnosis of SARS-CoV-2 by FDA under an Emergency Use Authorization (EUA).  This EUA will remain in effect (meaning this test can be used) for the duration of the COVID-19 declaration under Section 564(b)(1) of the Act,  21 U.S.C. section 360bbb-3(b)(1), unless the authorization is terminated or revoked sooner. Performed at [facility], [address] [place], [place] [address]   Urine culture     Status: None   Collection Time: 03/26/19  8:51 PM   Specimen: In/Out Cath Urine  Result Value Ref Range Status   Specimen Description   Final    IN/OUT CATH URINE Performed at [facility], [address], [place], [place] [address]    Special Requests   Final    NONE Performed at [facility], [address], [place], [place] [address]    Culture   Final    NO GROWTH Performed at [facility], [place], [place], [place] [address]    Report Status 03/27/2019 FINAL  Final  MRSA PCR Screening     Status: None   Collection Time: 03/27/19 10:00 AM   Specimen: Nasal Mucosa; Nasopharyngeal  Result Value Ref Range Status   MRSA by PCR NEGATIVE NEGATIVE Final    Comment:        The GeneXpert MRSA Assay (FDA approved for NASAL specimens only), is one component of a comprehensive MRSA colonization surveillance program. It is not intended to diagnose MRSA infection nor to guide or monitor treatment for MRSA infections. Performed at [facility], [address] [place], [place] [address]   CULTURE, BLOOD (ROUTINE X 2) w Reflex to ID Panel     Status: None (Preliminary  result)   Collection Time: 03/30/19  9:13 AM   Specimen: BLOOD  Result Value Ref Range Status   Specimen Description BLOOD R HAND  Final   Special Requests   Final    BOTTLES DRAWN AEROBIC AND ANAEROBIC Blood Culture adequate volume   Culture   Final    NO GROWTH < 24 HOURS Performed at [facility], [address], [place], [place] [address]    Report Status PENDING  Incomplete  CULTURE, BLOOD (ROUTINE X 2) w Reflex to ID Panel     Status: None (Preliminary result)   Collection Time: 03/30/19  9:21 AM   Specimen: BLOOD  Result Value Ref Range Status   Specimen Description BLOOD R AC  Final   Special Requests   Final    BOTTLES DRAWN AEROBIC AND ANAEROBIC Blood Culture adequate volume   Culture   Final    NO GROWTH < 24 HOURS Performed at [facility], [address] [place], [place] [address]    Report Status PENDING  Incomplete    IMAGING RESULTS: CT scan of the head personally reviewed.  Has age-related atrophy. Chest x-ray no infiltrate.  I have personally reviewed the films ? Impression/Recommendation ?73 year old male with a history of seizure disorder diagnosed first when he was the fifth grade has been on phenobarbital for many years presents with weakness fall and found to be having fever  ?Fever/Pancytopenia For fever infectious etiology like UTI has been ruled out.  Blood culture is negative. He has pain in his rectum/sacral area he has been on antibiotics vancomycin, cefepime and Flagyl.  No response to the fever.  We will stop all these antibiotics and observe.  His presentation could be due to phenobarbital drug reaction  differential diagnosis  Malignancy.  Rectal pain ?from the rectum versus sacrum bone.  May benefit from a flexible sigmoidoscopy and also x-ray of the sacrum  Microcytic anemia.  Usually in phenobarbital it causes megaloblastic anemia but he has been on B12 and has  normal folate and B12. Stool occult blood is negative.  Could he have a bone marrow related malignancy versus prosthetic mets.  Prostate cancer status post total prostatectomy.  His PSA from yesterday is 0.9.  Seizure disorder from childhood on phenobarbital. We will need to taper phenobarbital and stop it as very likely his symptoms of fatigue, dizziness falls and now fever with pancytopenia  could be related to that.  Discussed the management with the patient and his sister and Dr. Nathaniel Man and Dr. Mike Gip. Note:  This document was prepared using Dragon voice recognition software and may include unintentional dictation errors.

## 2019-03-31 NOTE — TOC Progression Note (Signed)
Transition of Care [facility]) - Progression Note    Patient Details  Name: Mike [name] MRN: [id number] Date of Birth: [birth year]  Transition of Care [facility]) CM/SW Contact  Shelbie Hutching, RN Phone Number: 03/31/2019, 10:06 AM  Clinical Narrative:    Patient has been very confused since being in the hospital and currently is only oriented to person.  Patient's sister [name] is his primary caregiver at home and has been coming to visit him frequently at the hospital.  Patient previously gave permission for [facility] to speak with [facility].   Patient also previously reported that he did not want to go to [facility] for rehab.  PT has recommended rehab and patient is not safe to return home.  SNF workup started, sister [name] chooses [facility] for rehab when patient is medically ready.  RNCM will follow up with patient and patient's sister throughout the week.    Expected Discharge Plan: [place] Barriers to Discharge: Continued Medical Work up  Expected Discharge Plan and Services Expected Discharge Plan: [place]   Discharge Planning Services: CM Consult Post Acute Care Choice: [place] Living arrangements for the past 2 months: Single Family Home                                       Social Determinants of Health (SDOH) Interventions    Readmission Risk Interventions No flowsheet data found.

## 2019-03-31 NOTE — Consult Note (Signed)
Reason for Consult: pancytopenia and seizure disorder  Referring Physician: Dr. Stark Jock   CC: pancytopenia   HPI: Mike [name] is an 73 y.o. male with hx diabetes, seizures, hypertension, hyperlipidemia, history of prostate cancer, anxiety, GERD, history of coronary disease to mitral valve regurgitation who presented to the hospital due to fever, fall and shortness of breath. Found to be septic without unclear source along with pancytopenia without clear etiology.  Patient has been on phenobarbital for 6 plus decades with 194mg  daily.  There has been no reported recent seizure activity.    Past Medical History:  Diagnosis Date  . Anxiety   . Diabetes mellitus without complication ([place])   . Epilepsy ([place])   . High cholesterol   . Hypertension   . Prostate cancer [facility]) 2004   prostate removed    Past Surgical History:  Procedure Laterality Date  . CARDIAC SURGERY    . IRRIGATION AND DEBRIDEMENT FOOT Right 06/14/2018   Procedure: IRRIGATION AND DEBRIDEMENT FOOT;  Surgeon: Sharlotte Alamo, DPM;  Location: [facility] ORS;  Service: Podiatry;  Laterality: Right;    No family history on file.  Social History:  reports that he has never smoked. He has never used smokeless tobacco. He reports that he does not drink alcohol or use drugs.  Allergies  Allergen Reactions  . Naproxen Sodium Other (See Comments)    Reaction: Upset stomach  . Nsaids Other (See Comments)    Other reaction(s): Unknown  . Aleve [Naproxen] Nausea And Vomiting  . Ibuprofen Nausea Only    Reaction: Upset stomach     Medications: I have reviewed the patient's current medications.  ROS: Unable to obtain due to confusion   Physical Examination: Blood pressure (!) 145/59, pulse 87, temperature 99.7 F (37.6 C), temperature source Oral, resp. rate 18, height 5\' 7"  (1.702 m), weight 90 kg, SpO2 97 %. Neurological Examination   Mental Status: Alert, oriented to name only  Cranial Nerves: II: Discs flat bilaterally;  Visual fields grossly normal, pupils equal, round, reactive to light and accommodation III,IV, VI: ptosis not present, extra-ocular motions intact bilaterally V,VII: smile symmetric, facial light touch sensation normal bilaterally VIII: hearing normal bilaterally IX,X: gag reflex present XI: bilateral shoulder shrug XII: midline tongue extension Motor: Generalized weakness.  Tone and bulk:normal tone throughout; no atrophy noted Sensory: Pinprick and light touch intact throughout, bilaterally Deep Tendon Reflexes: 1+ and symmetric throughout Plantars: Right: downgoing   Left: downgoing Cerebellar: Not tested     Laboratory Studies:   Basic Metabolic Panel: Recent Labs  Lab 03/26/19 2012 03/27/19 0255 03/28/19 0543 03/29/19 0448 03/31/19 0628  NA 138 140 141 136 134*  K 4.4 4.7 3.9 3.9 3.4*  CL 100 105 108 106 101  CO2 27 24 23 23 25   GLUCOSE 197* 187* 152* 203* 223*  BUN 30* 25* 25* 21 15  CREATININE 1.43* 1.30* 1.22 0.99 0.86  CALCIUM 9.5 8.6* 8.3* 8.1* 7.8*  MG  --   --   --   --  1.6*    Liver Function Tests: Recent Labs  Lab 03/26/19 2012  AST 20  ALT 14  ALKPHOS 116  BILITOT 0.9  PROT 8.3*  ALBUMIN 4.5   No results for input(s): LIPASE, AMYLASE in the last 168 hours. No results for input(s): AMMONIA in the last 168 hours.  CBC: Recent Labs  Lab 03/26/19 2012  03/28/19 0543 03/29/19 0448 03/30/19 [id number] 03/31/19 0628 03/31/19 0914  WBC 2.8*   < > 2.8* 3.0*  3.1* 2.7* 2.8*  NEUTROABS 0.8*  --   --   --  0.9*  --  1.0*  HGB 7.9*   < > 7.5* 7.5* 8.3* 6.9* 7.3*  7.2*  HCT 25.5*   < > 23.9* 23.7* 26.7* 21.6* 22.9*  22.9*  MCV 69.1*   < > 73.5* 72.5* 75.0* 72.0* 72.7*  PLT 148*   < > 108* 105* 103* 101* 108*   < > = values in this interval not displayed.    Cardiac Enzymes: No results for input(s): CKTOTAL, CKMB, CKMBINDEX, TROPONINI in the last 168 hours.  BNP: Invalid input(s): POCBNP  CBG: Recent Labs  Lab 03/30/19 1255 03/30/19 1715  03/30/19 2103 03/31/19 0749 03/31/19 1156  GLUCAP 312* 286* 237* 195* 204*    Microbiology: Results for orders placed or performed during the hospital encounter of 03/26/19  Culture, blood (Routine x 2)     Status: None   Collection Time: 03/26/19  8:12 PM   Specimen: BLOOD  Result Value Ref Range Status   Specimen Description BLOOD LEFT ANTECUBITAL  Final   Special Requests   Final    BOTTLES DRAWN AEROBIC AND ANAEROBIC Blood Culture adequate volume   Culture   Final    NO GROWTH 5 DAYS Performed at [facility], [address] [place], [place] [address]    Report Status 03/31/2019 FINAL  Final  Culture, blood (Routine x 2)     Status: None   Collection Time: 03/26/19  8:50 PM   Specimen: BLOOD  Result Value Ref Range Status   Specimen Description BLOOD BLOOD RIGHT HAND  Final   Special Requests   Final    BOTTLES DRAWN AEROBIC AND ANAEROBIC Blood Culture adequate volume   Culture   Final    NO GROWTH 5 DAYS Performed at [facility], [address], [place], [place] [address]    Report Status 03/31/2019 FINAL  Final  SARS Coronavirus 2 [facility] order, Performed in [facility] hospital lab) Nasopharyngeal Nasopharyngeal Swab     Status: None   Collection Time: 03/26/19  8:50 PM   Specimen: Nasopharyngeal Swab  Result Value Ref Range Status   SARS Coronavirus 2 NEGATIVE NEGATIVE Final    Comment: (NOTE) If result is NEGATIVE SARS-CoV-2 target nucleic acids are NOT DETECTED. The SARS-CoV-2 RNA is generally detectable in upper and lower  respiratory specimens during the acute phase of infection. The lowest  concentration of SARS-CoV-2 viral copies this assay can detect is 250  copies / mL. A negative result does not preclude SARS-CoV-2 infection  and should not be used as the sole basis for treatment or other  patient management decisions.  A negative result may occur with  improper specimen collection / handling, submission of specimen other  than  nasopharyngeal swab, presence of viral mutation(s) within the  areas targeted by this assay, and inadequate number of viral copies  (<250 copies / mL). A negative result must be combined with clinical  observations, patient history, and epidemiological information. If result is POSITIVE SARS-CoV-2 target nucleic acids are DETECTED. The SARS-CoV-2 RNA is generally detectable in upper and lower  respiratory specimens dur ing the acute phase of infection.  Positive  results are indicative of active infection with SARS-CoV-2.  Clinical  correlation with patient history and other diagnostic information is  necessary to determine patient infection status.  Positive results do  not rule out bacterial infection or co-infection with other viruses. If result is PRESUMPTIVE POSTIVE SARS-CoV-2 nucleic  acids MAY BE PRESENT.   A presumptive positive result was obtained on the submitted specimen  and confirmed on repeat testing.  While 2019 novel coronavirus  (SARS-CoV-2) nucleic acids may be present in the submitted sample  additional confirmatory testing may be necessary for epidemiological  and / or clinical management purposes  to differentiate between  SARS-CoV-2 and other Sarbecovirus currently known to infect humans.  If clinically indicated additional testing with an alternate test  methodology [phone]) is advised. The SARS-CoV-2 RNA is generally  detectable in upper and lower respiratory sp ecimens during the acute  phase of infection. The expected result is Negative. Fact Sheet for Patients:  StrictlyIdeas.no Fact Sheet for Healthcare Providers: BankingDealers.co.za This test is not yet approved or cleared by the [place] FDA and has been authorized for detection and/or diagnosis of SARS-CoV-2 by FDA under an Emergency Use Authorization (EUA).  This EUA will remain in effect (meaning this test can be used) for the duration of  the COVID-19 declaration under Section 564(b)(1) of the Act, 21 U.S.C. section 360bbb-3(b)(1), unless the authorization is terminated or revoked sooner. Performed at [facility], [address] [place], [place] [address]   Urine culture     Status: None   Collection Time: 03/26/19  8:51 PM   Specimen: In/Out Cath Urine  Result Value Ref Range Status   Specimen Description   Final    IN/OUT CATH URINE Performed at [facility], [address] [place], [place] [address]    Special Requests   Final    NONE Performed at [facility], [address], [place], [place] [address]    Culture   Final    NO GROWTH Performed at [facility], [place] [address], [place], [place] [address]    Report Status 03/27/2019 FINAL  Final  MRSA PCR Screening     Status: None   Collection Time: 03/27/19 10:00 AM   Specimen: Nasal Mucosa; Nasopharyngeal  Result Value Ref Range Status   MRSA by PCR NEGATIVE NEGATIVE Final    Comment:        The GeneXpert MRSA Assay (FDA approved for NASAL specimens only), is one component of a comprehensive MRSA colonization surveillance program. It is not intended to diagnose MRSA infection nor to guide or monitor treatment for MRSA infections. Performed at [facility], [address] [place], [place] [address]   CULTURE, BLOOD (ROUTINE X 2) w Reflex to ID Panel     Status: None (Preliminary result)   Collection Time: 03/30/19  9:13 AM   Specimen: BLOOD  Result Value Ref Range Status   Specimen Description BLOOD R HAND  Final   Special Requests   Final    BOTTLES DRAWN AEROBIC AND ANAEROBIC Blood Culture adequate volume   Culture   Final    NO GROWTH < 24 HOURS Performed at [facility], [address], [place], [place] [address]    Report Status PENDING  Incomplete  CULTURE, BLOOD (ROUTINE X 2) w Reflex to ID Panel     Status: None (Preliminary result)   Collection Time: 03/30/19  9:21 AM    Specimen: BLOOD  Result Value Ref Range Status   Specimen Description BLOOD R AC  Final   Special Requests   Final    BOTTLES DRAWN AEROBIC AND ANAEROBIC Blood Culture adequate volume   Culture   Final    NO GROWTH < 24 HOURS Performed at [facility], [place]  [address], [place], [place] [address]    Report Status PENDING  Incomplete  Urine Culture     Status: None   Collection Time: 03/30/19  9:21 AM   Specimen: Urine, Catheterized  Result Value Ref Range Status   Specimen Description   Final    URINE, CATHETERIZED Performed at [facility], [address] [place], [place] [address]    Special Requests   Final    NONE Performed at [facility], [address], [place], [place] [address]    Culture   Final    NO GROWTH Performed at [facility], [place] [address] [place],  [address]    Report Status 03/31/2019 FINAL  Final    Coagulation Studies: No results for input(s): LABPROT, INR in the last 72 hours.  Urinalysis:  Recent Labs  Lab 03/26/19 2051 03/30/19 0921  COLORURINE YELLOW* YELLOW*  LABSPEC 1.012 1.016  PHURINE 7.0 5.0  GLUCOSEU >=500* >=500*  HGBUR NEGATIVE MODERATE*  BILIRUBINUR NEGATIVE NEGATIVE  KETONESUR NEGATIVE 20*  PROTEINUR NEGATIVE 100*  NITRITE NEGATIVE NEGATIVE  LEUKOCYTESUR NEGATIVE TRACE*    Lipid Panel:  No results found for: CHOL, TRIG, HDL, CHOLHDL, VLDL, LDLCALC  HgbA1C:  Lab Results  Component Value Date   HGBA1C 9.1 (H) 03/27/2019    Urine Drug Screen:  No results found for: LABOPIA, COCAINSCRNUR, LABBENZ, AMPHETMU, THCU, LABBARB  Alcohol Level: No results for input(s): ETH in the last 168 hours.  Other results: EKG: normal EKG, normal sinus rhythm, unchanged from previous tracings.  Imaging: Dg Chest 1 View  Result Date: 03/30/2019 CLINICAL DATA:  Fever. EXAM: CHEST  1 VIEW COMPARISON:  March 26, 2019. FINDINGS: The heart size and mediastinal contours are within normal limits.  Both lungs are clear. The visualized skeletal structures are unremarkable. IMPRESSION: No active disease. Electronically Signed   By: Marijo Conception M.D.   On: 03/30/2019 08:47   Dg Shoulder Right  Result Date: 03/30/2019 CLINICAL DATA:  Right shoulder pain without trauma. EXAM: RIGHT SHOULDER - 2+ VIEW COMPARISON:  06/05/2018 FINDINGS: No acute fracture or dislocation. Visualized portion of the right hemithorax is normal. Median sternotomy. Mild degradation secondary to positioning. IMPRESSION: No acute osseous abnormality. Electronically Signed   By: Abigail Miyamoto M.D.   On: 03/30/2019 14:55     Assessment/Plan:  73 y.o. male with hx diabetes, seizures, hypertension, hyperlipidemia, history of prostate cancer, anxiety, GERD, history of coronary disease to mitral valve regurgitation who presented to the hospital due to fever, fall and shortness of breath. Found to be septic without unclear source along with pancytopenia without clear etiology.  Patient has been on phenobarbital for 6 plus decades with 194mg  daily.  There has been no reported recent seizure activity.    - Very well documented for phenobarbital to cause pancytopenia but unclear why would it manifest now after decades of use.  - s/p discussion with sister at bedside - will decrease phenobarbital over 5 days. Starting tomorrow with 65mg  IV BID x 2 days and then daily for 2 days.   - Vimpat started with 100mg  today and 100mg  PO daily  - Keppra has been associated with few case reports of pancytopenia and causes more somnolence then vimpat.   03/31/2019, 3:00 PM

## 2019-03-31 NOTE — Care Management Important Message (Signed)
Important Message  Patient Details  Name: MORTEN LETO MRN: [id number] Date of Birth: 1945-11-20   Medicare Important Message Given:  Yes     Dannette Barbara 03/31/2019, 2:10 PM

## 2019-03-31 NOTE — Progress Notes (Signed)
Vonda Antigua, MD [address], [place], [place], [place], [address] [address], [place], [place], [place], [address] Phone: [phone]  Fax: [phone]   Subjective: Patient still having fevers.  Was febrile at 102.5 yesterday.  No active GI bleeding.   Objective: Exam: Vital signs in last 24 hours: Vitals:   03/30/19 2104 03/30/19 2246 03/30/19 2311 03/31/19 0408  BP:    (!) 145/66  Pulse:    83  Resp:    18  Temp: (!) 103.1 F (39.5 C) (!) 102.1 F (38.9 C) 99.5 F (37.5 C) 98.8 F (37.1 C)  TempSrc: Oral Oral Oral Oral  SpO2:    95%  Weight:      Height:       Weight change:   Intake/Output Summary (Last 24 hours) at 03/31/2019 1153 Last data filed at 03/31/2019 1050 Gross per 24 hour  Intake 2960.31 ml  Output 4450 ml  Net -1489.69 ml    General: No acute distress, AAO x3 Abd: Soft, NT/ND, No HSM Skin: Warm, no rashes Neck: Supple, Trachea midline   Lab Results: Lab Results  Component Value Date   WBC 2.7 (L) 03/31/2019   HGB 7.2 (L) 03/31/2019   HCT 22.9 (L) 03/31/2019   MCV 72.0 (L) 03/31/2019   PLT 101 (L) 03/31/2019   Micro Results: Recent Results (from the past 240 hour(s))  Culture, blood (Routine x 2)     Status: None   Collection Time: 03/26/19  8:12 PM   Specimen: BLOOD  Result Value Ref Range Status   Specimen Description BLOOD LEFT ANTECUBITAL  Final   Special Requests   Final    BOTTLES DRAWN AEROBIC AND ANAEROBIC Blood Culture adequate volume   Culture   Final    NO GROWTH 5 DAYS Performed at [facility], [address] [place], [place] [address]    Report Status 03/31/2019 FINAL  Final  Culture, blood (Routine x 2)     Status: None   Collection Time: 03/26/19  8:50 PM   Specimen: BLOOD  Result Value Ref Range Status   Specimen Description BLOOD BLOOD RIGHT HAND  Final   Special Requests   Final    BOTTLES DRAWN AEROBIC AND ANAEROBIC Blood Culture adequate volume   Culture   Final    NO GROWTH 5  DAYS Performed at [facility], [address] [place], [place] [address]    Report Status 03/31/2019 FINAL  Final  SARS Coronavirus 2 [facility] order, Performed in [facility] hospital lab) Nasopharyngeal Nasopharyngeal Swab     Status: None   Collection Time: 03/26/19  8:50 PM   Specimen: Nasopharyngeal Swab  Result Value Ref Range Status   SARS Coronavirus 2 NEGATIVE NEGATIVE Final    Comment: (NOTE) If result is NEGATIVE SARS-CoV-2 target nucleic acids are NOT DETECTED. The SARS-CoV-2 RNA is generally detectable in upper and lower  respiratory specimens during the acute phase of infection. The lowest  concentration of SARS-CoV-2 viral copies this assay can detect is 250  copies / mL. A negative result does not preclude SARS-CoV-2 infection  and should not be used as the sole basis for treatment or other  patient management decisions.  A negative result may occur with  improper specimen collection / handling, submission of specimen other  than nasopharyngeal swab, presence of viral mutation(s) within the  areas targeted by this assay, and inadequate number of viral copies  (<250 copies / mL). A negative result must be combined  with clinical  observations, patient history, and epidemiological information. If result is POSITIVE SARS-CoV-2 target nucleic acids are DETECTED. The SARS-CoV-2 RNA is generally detectable in upper and lower  respiratory specimens dur ing the acute phase of infection.  Positive  results are indicative of active infection with SARS-CoV-2.  Clinical  correlation with patient history and other diagnostic information is  necessary to determine patient infection status.  Positive results do  not rule out bacterial infection or co-infection with other viruses. If result is PRESUMPTIVE POSTIVE SARS-CoV-2 nucleic acids MAY BE PRESENT.   A presumptive positive result was obtained on the submitted specimen  and confirmed on repeat testing.  While 2019  novel coronavirus  (SARS-CoV-2) nucleic acids may be present in the submitted sample  additional confirmatory testing may be necessary for epidemiological  and / or clinical management purposes  to differentiate between  SARS-CoV-2 and other Sarbecovirus currently known to infect humans.  If clinically indicated additional testing with an alternate test  methodology [phone]) is advised. The SARS-CoV-2 RNA is generally  detectable in upper and lower respiratory sp ecimens during the acute  phase of infection. The expected result is Negative. Fact Sheet for Patients:  StrictlyIdeas.no Fact Sheet for Healthcare Providers: BankingDealers.co.za This test is not yet approved or cleared by the [place] FDA and has been authorized for detection and/or diagnosis of SARS-CoV-2 by FDA under an Emergency Use Authorization (EUA).  This EUA will remain in effect (meaning this test can be used) for the duration of the COVID-19 declaration under Section 564(b)(1) of the Act, 21 U.S.C. section 360bbb-3(b)(1), unless the authorization is terminated or revoked sooner. Performed at [facility], [address], [place], [place] [address]   Urine culture     Status: None   Collection Time: 03/26/19  8:51 PM   Specimen: In/Out Cath Urine  Result Value Ref Range Status   Specimen Description   Final    IN/OUT CATH URINE Performed at [facility], [address] [place], [place] [address]    Special Requests   Final    NONE Performed at [facility], [address], [place], [place] [address]    Culture   Final    NO GROWTH Performed at [facility], [place] [address], [place], [place] [address]    Report Status 03/27/2019 FINAL  Final  MRSA PCR Screening     Status: None   Collection Time: 03/27/19 10:00 AM   Specimen: Nasal Mucosa; Nasopharyngeal  Result Value Ref Range Status   MRSA by PCR NEGATIVE NEGATIVE  Final    Comment:        The GeneXpert MRSA Assay (FDA approved for NASAL specimens only), is one component of a comprehensive MRSA colonization surveillance program. It is not intended to diagnose MRSA infection nor to guide or monitor treatment for MRSA infections. Performed at [facility], [address] [place], [place] [address]   CULTURE, BLOOD (ROUTINE X 2) w Reflex to ID Panel     Status: None (Preliminary result)   Collection Time: 03/30/19  9:13 AM   Specimen: BLOOD  Result Value Ref Range Status   Specimen Description BLOOD R HAND  Final   Special Requests   Final    BOTTLES DRAWN AEROBIC AND ANAEROBIC Blood Culture adequate volume   Culture   Final    NO GROWTH < 24 HOURS Performed at [facility], [address] [place], [place] [address]  Report Status PENDING  Incomplete  CULTURE, BLOOD (ROUTINE X 2) w Reflex to ID Panel     Status: None (Preliminary result)   Collection Time: 03/30/19  9:21 AM   Specimen: BLOOD  Result Value Ref Range Status   Specimen Description BLOOD R AC  Final   Special Requests   Final    BOTTLES DRAWN AEROBIC AND ANAEROBIC Blood Culture adequate volume   Culture   Final    NO GROWTH < 24 HOURS Performed at [facility], [address], [place], [place] [address]    Report Status PENDING  Incomplete   Studies/Results: Dg Chest 1 View  Result Date: 03/30/2019 CLINICAL DATA:  Fever. EXAM: CHEST  1 VIEW COMPARISON:  March 26, 2019. FINDINGS: The heart size and mediastinal contours are within normal limits. Both lungs are clear. The visualized skeletal structures are unremarkable. IMPRESSION: No active disease. Electronically Signed   By: Marijo Conception M.D.   On: 03/30/2019 08:47   Dg Shoulder Right  Result Date: 03/30/2019 CLINICAL DATA:  Right shoulder pain without trauma. EXAM: RIGHT SHOULDER - 2+ VIEW COMPARISON:  06/05/2018 FINDINGS: No acute fracture or dislocation. Visualized portion of the  right hemithorax is normal. Median sternotomy. Mild degradation secondary to positioning. IMPRESSION: No acute osseous abnormality. Electronically Signed   By: Abigail Miyamoto M.D.   On: 03/30/2019 14:55   Medications:  Scheduled Meds: . ALPRAZolam  0.5-1 mg Oral BID  . aspirin EC  81 mg Oral Daily  . atorvastatin  20 mg Oral Daily  . Chlorhexidine Gluconate Cloth  6 each Topical Daily  . cholecalciferol  1,000 Units Oral Daily  . furosemide  40 mg Oral Daily  . gabapentin  1,200 mg Oral QHS  . gabapentin  300 mg Oral Daily  . insulin aspart  0-5 Units Subcutaneous QHS  . insulin aspart  0-9 Units Subcutaneous TID WC  . metoprolol succinate  50 mg Oral BID  . omega-3 acid ethyl esters  1,000 mg Oral Daily  . pantoprazole  40 mg Oral Daily  . PARoxetine  20 mg Oral Daily  . PHENobarbital  194.4 mg Oral Daily  . sodium chloride flush  10-40 mL Intracatheter Q12H  . tamsulosin  0.4 mg Oral Daily  . vitamin B-12  2,000 mcg Oral Daily   Continuous Infusions: . sodium chloride 75 mL/hr at 03/31/19 1035   PRN Meds:.acetaminophen **OR** acetaminophen, acetaminophen-codeine, docusate sodium, ipratropium-albuterol, morphine injection, nitroGLYCERIN, opium-belladonna, polyethylene glycol, sodium chloride flush   Assessment: Active Problems:   Sepsis ([place])    Plan: Hemoglobin stable Continue treatment for sepsis as per primary team High risk for endoscopic procedures at this time given ongoing fevers  No active GI bleeding to indicate urgent endoscopy at this time Can proceed with endoscopic evaluation for microcytic anemia as an inpatient or outpatient as appropriate depending on clinical condition   LOS: 4 days   Vonda Antigua, MD 03/31/2019, 11:53 AM

## 2019-04-01 ENCOUNTER — Inpatient Hospital Stay: Payer: Medicare Other

## 2019-04-01 DIAGNOSIS — R4182 Altered mental status, unspecified: Secondary | ICD-10-CM

## 2019-04-01 LAB — CBC
HCT: 21.8 % — ABNORMAL LOW (ref 39.0–52.0)
Hemoglobin: 6.9 g/dL — ABNORMAL LOW (ref 13.0–17.0)
MCH: 22.8 pg — ABNORMAL LOW (ref 26.0–34.0)
MCHC: 31.7 g/dL (ref 30.0–36.0)
MCV: 72.2 fL — ABNORMAL LOW (ref 80.0–100.0)
Platelets: 120 10*3/uL — ABNORMAL LOW (ref 150–400)
RBC: 3.02 MIL/uL — ABNORMAL LOW (ref 4.22–5.81)
RDW: 21.5 % — ABNORMAL HIGH (ref 11.5–15.5)
WBC: 3 10*3/uL — ABNORMAL LOW (ref 4.0–10.5)
nRBC: 0.7 % — ABNORMAL HIGH (ref 0.0–0.2)

## 2019-04-01 LAB — CBC WITH DIFFERENTIAL/PLATELET
Abs Immature Granulocytes: 0.24 10*3/uL — ABNORMAL HIGH (ref 0.00–0.07)
Basophils Absolute: 0 10*3/uL (ref 0.0–0.1)
Basophils Relative: 0 %
Eosinophils Absolute: 0.1 10*3/uL (ref 0.0–0.5)
Eosinophils Relative: 3 %
HCT: 21.4 % — ABNORMAL LOW (ref 39.0–52.0)
Hemoglobin: 6.8 g/dL — ABNORMAL LOW (ref 13.0–17.0)
Immature Granulocytes: 8 %
Lymphocytes Relative: 42 %
Lymphs Abs: 1.3 10*3/uL (ref 0.7–4.0)
MCH: 23 pg — ABNORMAL LOW (ref 26.0–34.0)
MCHC: 31.8 g/dL (ref 30.0–36.0)
MCV: 72.3 fL — ABNORMAL LOW (ref 80.0–100.0)
Monocytes Absolute: 0.4 10*3/uL (ref 0.1–1.0)
Monocytes Relative: 11 %
Neutro Abs: 1.1 10*3/uL — ABNORMAL LOW (ref 1.7–7.7)
Neutrophils Relative %: 36 %
Platelets: 118 10*3/uL — ABNORMAL LOW (ref 150–400)
RBC: 2.96 MIL/uL — ABNORMAL LOW (ref 4.22–5.81)
RDW: 21.9 % — ABNORMAL HIGH (ref 11.5–15.5)
Smear Review: UNDETERMINED
WBC: 3.1 10*3/uL — ABNORMAL LOW (ref 4.0–10.5)
nRBC: 0.7 % — ABNORMAL HIGH (ref 0.0–0.2)

## 2019-04-01 LAB — PREPARE RBC (CROSSMATCH)

## 2019-04-01 LAB — BASIC METABOLIC PANEL
Anion gap: 12 (ref 5–15)
BUN: 15 mg/dL (ref 8–23)
CO2: 23 mmol/L (ref 22–32)
Calcium: 7.9 mg/dL — ABNORMAL LOW (ref 8.9–10.3)
Chloride: 98 mmol/L (ref 98–111)
Creatinine, Ser: 0.92 mg/dL (ref 0.61–1.24)
GFR calc Af Amer: >60 mL/min (ref >60)
GFR calc non Af Amer: >60 mL/min (ref >60)
Glucose, Bld: 228 mg/dL — ABNORMAL HIGH (ref 70–99)
Potassium: 3.8 mmol/L (ref 3.5–5.1)
Sodium: 133 mmol/L — ABNORMAL LOW (ref 135–145)

## 2019-04-01 LAB — COMP PANEL: LEUKEMIA/LYMPHOMA

## 2019-04-01 LAB — GLUCOSE, CAPILLARY
Glucose-Capillary: 218 mg/dL — ABNORMAL HIGH (ref 70–99)
Glucose-Capillary: 215 mg/dL — ABNORMAL HIGH (ref 70–99)
Glucose-Capillary: 225 mg/dL — ABNORMAL HIGH (ref 70–99)
Glucose-Capillary: 221 mg/dL — ABNORMAL HIGH (ref 70–99)

## 2019-04-01 LAB — MAGNESIUM: Magnesium: 2 mg/dL (ref 1.7–2.4)

## 2019-04-01 LAB — HIV ANTIBODY (ROUTINE TESTING W REFLEX): HIV Screen 4th Generation wRfx: NONREACTIVE

## 2019-04-01 MED ORDER — MIDAZOLAM HCL 5 MG/5ML IJ SOLN
INTRAMUSCULAR | Status: AC
  Administered 2019-04-01: 11:00:00
  Filled 2019-04-01: qty 5

## 2019-04-01 MED ORDER — HEPARIN SOD (PORK) LOCK FLUSH 100 UNIT/ML IV SOLN
INTRAVENOUS | Status: AC
  Administered 2019-04-01: 11:00:00
  Filled 2019-04-01: qty 5

## 2019-04-01 MED ORDER — MIDAZOLAM HCL 2 MG/2ML IJ SOLN
INTRAMUSCULAR | Status: AC | PRN
  Administered 2019-04-01 (×2): 1 mg via INTRAVENOUS

## 2019-04-01 MED ORDER — LORAZEPAM 2 MG/ML IJ SOLN
0.5000 mg | INTRAMUSCULAR | Status: DC | PRN
  Administered 2019-04-01 – 2019-04-04 (×4): 0.5 mg via INTRAVENOUS
  Filled 2019-04-01 (×4): qty 1

## 2019-04-01 MED ORDER — FENTANYL CITRATE (PF) 100 MCG/2ML IJ SOLN
INTRAMUSCULAR | Status: AC
  Administered 2019-04-01: 13:00:00
  Filled 2019-04-01: qty 2

## 2019-04-01 MED ORDER — FENTANYL CITRATE (PF) 100 MCG/2ML IJ SOLN
INTRAMUSCULAR | Status: AC | PRN
  Administered 2019-04-01: 50 ug via INTRAVENOUS
  Administered 2019-04-01: 25 ug via INTRAVENOUS

## 2019-04-01 MED ORDER — SODIUM CHLORIDE 0.9% IV SOLUTION
Freq: Once | INTRAVENOUS | Status: AC
  Administered 2019-04-01: 13:00:00 via INTRAVENOUS

## 2019-04-01 NOTE — Progress Notes (Signed)
   Date of Admission:  03/26/2019   patient asleep after bone marrow biopsy spoke Spoke to the sister  Patient Vitals for the past 24 hrs:  BP Temp Temp src Pulse Resp SpO2  04/01/19 1437 (!) 141/61 100.2 F (37.9 C) Axillary 75 18 97 %  04/01/19 1415 (!) 145/64 99.2 F (37.3 C) Axillary 75 18 98 %  04/01/19 1341 127/60 98.8 F (37.1 C) Oral 70 18 96 %  04/01/19 1135 - - - 81 16 95 %  04/01/19 1130 (!) 156/61 - - 81 17 95 %  04/01/19 1120 (!) 154/66 - - - 18 -  04/01/19 1110 (!) 144/58 - - - - -  04/01/19 1108 (!) 149/64 - - 81 10 100 %  04/01/19 1105 (!) 148/70 - - 81 12 100 %  04/01/19 1100 (!) 146/72 - - 85 15 100 %  04/01/19 1056 (!) 157/62 - - 81 19 99 %  04/01/19 1048 (!) 141/55 - - 81 13 95 %  04/01/19 0506 (!) 132/59 99 F (37.2 C) Oral 78 18 96 %  03/31/19 2028 134/65 98.4 F (36.9 C) Oral 79 17 100 %     Lab Results Recent Labs    03/31/19 0628 03/31/19 0914 04/01/19 0747  WBC 2.7* 2.8* 3.1*  3.0*  HGB 6.9* 7.3*  7.2* 6.8*  6.9*  HCT 21.6* 22.9*  22.9* 21.4*  21.8*  NA 134*  --  133*  K 3.4*  --  3.8  CL 101  --  98  CO2 25  --  23  BUN 15  --  15  CREATININE 0.86  --  0.92    Assessment/Plan:  Fever currently low-grade. Pancytopenia. Malignancy versus medication versus infection Blood culture, urine culture has been negative.  Chest x-ray no infiltrate No response to vancomycin and cefepime and Flagyl.  As patient is stable discontinued all the antibiotics and observing him .  HIV nonreactive  Neutropenia: Absolute neutrophil count is around thousand and hence not a high risk candidate.  Microcytic anemia for the past 8 months.  Work-up so far unrevealing.  There is no hemolysis.  Reticulocyte count low indicative of possible bone marrow suppression.  Could be from phenobarbital or could be from another cause.  Bone marrow biopsy done today. Seizure disorder on phenobarbital. Seen by neurologist and phenobarbital is being tapered and he has been  started on Vimpat.  History of prostate CA status post prostatectomy.  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  discussed the management with the sister.

## 2019-04-01 NOTE — Consult Note (Signed)
Subjective: agitated this AM  Past Medical History:  Diagnosis Date  . Anxiety   . Diabetes mellitus without complication ([place])   . Epilepsy ([place])   . High cholesterol   . Hypertension   . Prostate cancer [facility]) 2004   prostate removed    Past Surgical History:  Procedure Laterality Date  . CARDIAC SURGERY    . IRRIGATION AND DEBRIDEMENT FOOT Right 06/14/2018   Procedure: IRRIGATION AND DEBRIDEMENT FOOT;  Surgeon: Sharlotte Alamo, DPM;  Location: [facility] ORS;  Service: Podiatry;  Laterality: Right;    No family history on file.  Social History:  reports that he has never smoked. He has never used smokeless tobacco. He reports that he does not drink alcohol or use drugs.  Allergies  Allergen Reactions  . Naproxen Sodium Other (See Comments)    Reaction: Upset stomach  . Nsaids Other (See Comments)    Other reaction(s): Unknown  . Aleve [Naproxen] Nausea And Vomiting  . Ibuprofen Nausea Only    Reaction: Upset stomach     Medications: I have reviewed the patient's current medications.  ROS: Unable to obtain due to confusion   Physical Examination: Blood pressure (!) 156/61, pulse 81, temperature 99 F (37.2 C), temperature source Oral, resp. rate 16, height 5\' 7"  (1.702 m), weight 90 kg, SpO2 95 %. Neurological Examination   Mental Status: Alert, oriented to name only  Cranial Nerves: II: Discs flat bilaterally; Visual fields grossly normal, pupils equal, round, reactive to light and accommodation III,IV, VI: ptosis not present, extra-ocular motions intact bilaterally V,VII: smile symmetric, facial light touch sensation normal bilaterally VIII: hearing normal bilaterally IX,X: gag reflex present XI: bilateral shoulder shrug XII: midline tongue extension Motor: Generalized weakness.  Tone and bulk:normal tone throughout; no atrophy noted Sensory: Pinprick and light touch intact throughout, bilaterally Deep Tendon Reflexes: 1+ and symmetric throughout Plantars: Right:  downgoing   Left: downgoing Cerebellar: Not tested     Laboratory Studies:   Basic Metabolic Panel: Recent Labs  Lab 03/27/19 0255 03/28/19 0543 03/29/19 0448 03/31/19 0628 04/01/19 0747  NA 140 141 136 134* 133*  K 4.7 3.9 3.9 3.4* 3.8  CL 105 108 106 101 98  CO2 24 23 23 25 23   GLUCOSE 187* 152* 203* 223* 228*  BUN 25* 25* 21 15 15   CREATININE 1.30* 1.22 0.99 0.86 0.92  CALCIUM 8.6* 8.3* 8.1* 7.8* 7.9*  MG  --   --   --  1.6* 2.0    Liver Function Tests: Recent Labs  Lab 03/26/19 2012  AST 20  ALT 14  ALKPHOS 116  BILITOT 0.9  PROT 8.3*  ALBUMIN 4.5   No results for input(s): LIPASE, AMYLASE in the last 168 hours. No results for input(s): AMMONIA in the last 168 hours.  CBC: Recent Labs  Lab 03/26/19 2012  03/29/19 0448 03/30/19 [id number] 03/31/19 0628 03/31/19 0914 04/01/19 0747  WBC 2.8*   < > 3.0* 3.1* 2.7* 2.8* 3.1*  3.0*  NEUTROABS 0.8*  --   --  0.9*  --  1.0* 1.1*  HGB 7.9*   < > 7.5* 8.3* 6.9* 7.3*  7.2* 6.8*  6.9*  HCT 25.5*   < > 23.7* 26.7* 21.6* 22.9*  22.9* 21.4*  21.8*  MCV 69.1*   < > 72.5* 75.0* 72.0* 72.7* 72.3*  72.2*  PLT 148*   < > 105* 103* 101* 108* 118*  120*   < > = values in this interval not displayed.    Cardiac  Enzymes: No results for input(s): CKTOTAL, CKMB, CKMBINDEX, TROPONINI in the last 168 hours.  BNP: Invalid input(s): POCBNP  CBG: Recent Labs  Lab 03/31/19 0749 03/31/19 1156 03/31/19 1636 03/31/19 2118 04/01/19 0751  GLUCAP 195* 204* 330* 264* 218*    Microbiology: Results for orders placed or performed during the hospital encounter of 03/26/19  Culture, blood (Routine x 2)     Status: None   Collection Time: 03/26/19  8:12 PM   Specimen: BLOOD  Result Value Ref Range Status   Specimen Description BLOOD LEFT ANTECUBITAL  Final   Special Requests   Final    BOTTLES DRAWN AEROBIC AND ANAEROBIC Blood Culture adequate volume   Culture   Final    NO GROWTH 5 DAYS Performed at [facility], [address], [place], [place] [address]    Report Status 03/31/2019 FINAL  Final  Culture, blood (Routine x 2)     Status: None   Collection Time: 03/26/19  8:50 PM   Specimen: BLOOD  Result Value Ref Range Status   Specimen Description BLOOD BLOOD RIGHT HAND  Final   Special Requests   Final    BOTTLES DRAWN AEROBIC AND ANAEROBIC Blood Culture adequate volume   Culture   Final    NO GROWTH 5 DAYS Performed at [facility], [address] [place], [place] [address]    Report Status 03/31/2019 FINAL  Final  SARS Coronavirus 2 [facility] order, Performed in [facility] hospital lab) Nasopharyngeal Nasopharyngeal Swab     Status: None   Collection Time: 03/26/19  8:50 PM   Specimen: Nasopharyngeal Swab  Result Value Ref Range Status   SARS Coronavirus 2 NEGATIVE NEGATIVE Final    Comment: (NOTE) If result is NEGATIVE SARS-CoV-2 target nucleic acids are NOT DETECTED. The SARS-CoV-2 RNA is generally detectable in upper and lower  respiratory specimens during the acute phase of infection. The lowest  concentration of SARS-CoV-2 viral copies this assay can detect is 250  copies / mL. A negative result does not preclude SARS-CoV-2 infection  and should not be used as the sole basis for treatment or other  patient management decisions.  A negative result may occur with  improper specimen collection / handling, submission of specimen other  than nasopharyngeal swab, presence of viral mutation(s) within the  areas targeted by this assay, and inadequate number of viral copies  (<250 copies / mL). A negative result must be combined with clinical  observations, patient history, and epidemiological information. If result is POSITIVE SARS-CoV-2 target nucleic acids are DETECTED. The SARS-CoV-2 RNA is generally detectable in upper and lower  respiratory specimens dur ing the acute phase of infection.  Positive  results are indicative of active infection with SARS-CoV-2.   Clinical  correlation with patient history and other diagnostic information is  necessary to determine patient infection status.  Positive results do  not rule out bacterial infection or co-infection with other viruses. If result is PRESUMPTIVE POSTIVE SARS-CoV-2 nucleic acids MAY BE PRESENT.   A presumptive positive result was obtained on the submitted specimen  and confirmed on repeat testing.  While 2019 novel coronavirus  (SARS-CoV-2) nucleic acids may be present in the submitted sample  additional confirmatory testing may be necessary for epidemiological  and / or clinical management purposes  to differentiate between  SARS-CoV-2 and other Sarbecovirus currently known to infect humans.  If clinically indicated additional testing with an alternate test  methodology [phone]) is advised. The SARS-CoV-2 RNA is  generally  detectable in upper and lower respiratory sp ecimens during the acute  phase of infection. The expected result is Negative. Fact Sheet for Patients:  StrictlyIdeas.no Fact Sheet for Healthcare Providers: BankingDealers.co.za This test is not yet approved or cleared by the [place] FDA and has been authorized for detection and/or diagnosis of SARS-CoV-2 by FDA under an Emergency Use Authorization (EUA).  This EUA will remain in effect (meaning this test can be used) for the duration of the COVID-19 declaration under Section 564(b)(1) of the Act, 21 U.S.C. section 360bbb-3(b)(1), unless the authorization is terminated or revoked sooner. Performed at [facility], [address] [place], [place] [address]   Urine culture     Status: None   Collection Time: 03/26/19  8:51 PM   Specimen: In/Out Cath Urine  Result Value Ref Range Status   Specimen Description   Final    IN/OUT CATH URINE Performed at [facility], [address], [place], [place] [address]    Special Requests   Final     NONE Performed at [facility], [address], [place], [place] [address]    Culture   Final    NO GROWTH Performed at [facility], [place] [address], [place], [place] [address]    Report Status 03/27/2019 FINAL  Final  MRSA PCR Screening     Status: None   Collection Time: 03/27/19 10:00 AM   Specimen: Nasal Mucosa; Nasopharyngeal  Result Value Ref Range Status   MRSA by PCR NEGATIVE NEGATIVE Final    Comment:        The GeneXpert MRSA Assay (FDA approved for NASAL specimens only), is one component of a comprehensive MRSA colonization surveillance program. It is not intended to diagnose MRSA infection nor to guide or monitor treatment for MRSA infections. Performed at [facility], [address] [place], [place] [address]   CULTURE, BLOOD (ROUTINE X 2) w Reflex to ID Panel     Status: None (Preliminary result)   Collection Time: 03/30/19  9:13 AM   Specimen: BLOOD  Result Value Ref Range Status   Specimen Description BLOOD R HAND  Final   Special Requests   Final    BOTTLES DRAWN AEROBIC AND ANAEROBIC Blood Culture adequate volume   Culture   Final    NO GROWTH 2 DAYS Performed at [facility], [address], [place], [place] [address]    Report Status PENDING  Incomplete  CULTURE, BLOOD (ROUTINE X 2) w Reflex to ID Panel     Status: None (Preliminary result)   Collection Time: 03/30/19  9:21 AM   Specimen: BLOOD  Result Value Ref Range Status   Specimen Description BLOOD R AC  Final   Special Requests   Final    BOTTLES DRAWN AEROBIC AND ANAEROBIC Blood Culture adequate volume   Culture   Final    NO GROWTH 2 DAYS Performed at [facility], [address] [place], [place] [address]    Report Status PENDING  Incomplete  Urine Culture     Status: None   Collection Time: 03/30/19  9:21 AM   Specimen: Urine, Catheterized  Result Value Ref Range Status   Specimen Description   Final    URINE, CATHETERIZED Performed  at [facility], [address] [place], [place] [address]    Special Requests   Final    NONE Performed at [facility], [address], [place], [place] [address]  Culture   Final    NO GROWTH Performed at [facility], [place] [address], [place], [place] [address]    Report Status 03/31/2019 FINAL  Final    Coagulation Studies: No results for input(s): LABPROT, INR in the last 72 hours.  Urinalysis:  Recent Labs  Lab 03/26/19 2051 03/30/19 0921  COLORURINE YELLOW* YELLOW*  LABSPEC 1.012 1.016  PHURINE 7.0 5.0  GLUCOSEU >=500* >=500*  HGBUR NEGATIVE MODERATE*  BILIRUBINUR NEGATIVE NEGATIVE  KETONESUR NEGATIVE 20*  PROTEINUR NEGATIVE 100*  NITRITE NEGATIVE NEGATIVE  LEUKOCYTESUR NEGATIVE TRACE*    Lipid Panel:  No results found for: CHOL, TRIG, HDL, CHOLHDL, VLDL, LDLCALC  HgbA1C:  Lab Results  Component Value Date   HGBA1C 9.1 (H) 03/27/2019    Urine Drug Screen:  No results found for: LABOPIA, COCAINSCRNUR, LABBENZ, AMPHETMU, THCU, LABBARB  Alcohol Level: No results for input(s): ETH in the last 168 hours.  Other results: EKG: normal EKG, normal sinus rhythm, unchanged from previous tracings.  Imaging: Dg Shoulder Right  Result Date: 03/30/2019 CLINICAL DATA:  Right shoulder pain without trauma. EXAM: RIGHT SHOULDER - 2+ VIEW COMPARISON:  06/05/2018 FINDINGS: No acute fracture or dislocation. Visualized portion of the right hemithorax is normal. Median sternotomy. Mild degradation secondary to positioning. IMPRESSION: No acute osseous abnormality. Electronically Signed   By: Abigail Miyamoto M.D.   On: 03/30/2019 14:55     Assessment/Plan:  73 y.o. male with hx diabetes, seizures, hypertension, hyperlipidemia, history of prostate cancer, anxiety, GERD, history of coronary disease to mitral valve regurgitation who presented to the hospital due to fever, fall and shortness of breath. Found to be septic without unclear source along with  pancytopenia without clear etiology.  Patient has been on phenobarbital for 6 plus decades with 194mg  daily.  There has been no reported recent seizure activity.    - Very well documented for phenobarbital to cause pancytopenia but unclear why would it manifest now after decades of use.  - s/p discussion with sister at bedside - having phenobarbital decrease over 5 days starting today with 65mg  IV BID x 2 days and then daily for 2 days.   - Vimpat started with 100mg  bid - Keppra has been associated with few case reports of pancytopenia and causes more somnolence then vimpat.   - Transfusion for low H/H 04/01/2019, 11:57 AM

## 2019-04-01 NOTE — Procedures (Signed)
Interventional Radiology Procedure Note  Procedure: CT guided aspirate and core biopsy of right iliac bone Complications: None Recommendations: - Bedrest supine x 1 hrs - Hydrocodone PRN  Pain - Follow biopsy results  Signed,  Ilayda Toda K. Ginger Leeth, MD   

## 2019-04-01 NOTE — Progress Notes (Signed)
Mike Antigua, MD [address], [place], [place], [place], [address] [address], [place], [place], [place], [address] Phone: [phone]  Fax: [phone]   Subjective: No active GI bleeding.  Resting in bed comfortably.   Objective: Exam: Vital signs in last 24 hours: Vitals:   04/01/19 1135 04/01/19 1341 04/01/19 1415 04/01/19 1437  BP:  127/60 (!) 145/64 (!) 141/61  Pulse: 81 70 75 75  Resp: '16 18 18 18  ' Temp:  98.8 F (37.1 C) 99.2 F (37.3 C) 100.2 F (37.9 C)  TempSrc:  Oral Axillary Axillary  SpO2: 95% 96% 98% 97%  Weight:      Height:       Weight change:   Intake/Output Summary (Last 24 hours) at 04/01/2019 1620 Last data filed at 04/01/2019 1500 Gross per 24 hour  Intake 1732.01 ml  Output 625 ml  Net 1107.01 ml    General: No acute distress, AAO x3 Abd: Soft, NT/ND, No HSM Skin: Warm, no rashes Neck: Supple, Trachea midline   Lab Results: Lab Results  Component Value Date   WBC 3.0 (L) 04/01/2019   WBC 3.1 (L) 04/01/2019   HGB 6.9 (L) 04/01/2019   HGB 6.8 (L) 04/01/2019   HCT 21.8 (L) 04/01/2019   HCT 21.4 (L) 04/01/2019   MCV 72.2 (L) 04/01/2019   MCV 72.3 (L) 04/01/2019   PLT 120 (L) 04/01/2019   PLT 118 (L) 04/01/2019   Micro Results: Recent Results (from the past 240 hour(s))  Culture, blood (Routine x 2)     Status: None   Collection Time: 03/26/19  8:12 PM   Specimen: BLOOD  Result Value Ref Range Status   Specimen Description BLOOD LEFT ANTECUBITAL  Final   Special Requests   Final    BOTTLES DRAWN AEROBIC AND ANAEROBIC Blood Culture adequate volume   Culture   Final    NO GROWTH 5 DAYS Performed at [facility], [address] [place], [place] [address]    Report Status 03/31/2019 FINAL  Final  Culture, blood (Routine x 2)     Status: None   Collection Time: 03/26/19  8:50 PM   Specimen: BLOOD  Result Value Ref Range Status   Specimen Description BLOOD BLOOD RIGHT HAND  Final   Special Requests    Final    BOTTLES DRAWN AEROBIC AND ANAEROBIC Blood Culture adequate volume   Culture   Final    NO GROWTH 5 DAYS Performed at [facility], [address] [place], [place] [address]    Report Status 03/31/2019 FINAL  Final  SARS Coronavirus 2 [facility] order, Performed in [facility] hospital lab) Nasopharyngeal Nasopharyngeal Swab     Status: None   Collection Time: 03/26/19  8:50 PM   Specimen: Nasopharyngeal Swab  Result Value Ref Range Status   SARS Coronavirus 2 NEGATIVE NEGATIVE Final    Comment: (NOTE) If result is NEGATIVE SARS-CoV-2 target nucleic acids are NOT DETECTED. The SARS-CoV-2 RNA is generally detectable in upper and lower  respiratory specimens during the acute phase of infection. The lowest  concentration of SARS-CoV-2 viral copies this assay can detect is 250  copies / mL. A negative result does not preclude SARS-CoV-2 infection  and should not be used as the sole basis for treatment or other  patient management decisions.  A negative result may occur with  improper specimen collection / handling, submission of specimen other  than nasopharyngeal swab, presence of viral mutation(s) within the  areas targeted  by this assay, and inadequate number of viral copies  (<250 copies / mL). A negative result must be combined with clinical  observations, patient history, and epidemiological information. If result is POSITIVE SARS-CoV-2 target nucleic acids are DETECTED. The SARS-CoV-2 RNA is generally detectable in upper and lower  respiratory specimens dur ing the acute phase of infection.  Positive  results are indicative of active infection with SARS-CoV-2.  Clinical  correlation with patient history and other diagnostic information is  necessary to determine patient infection status.  Positive results do  not rule out bacterial infection or co-infection with other viruses. If result is PRESUMPTIVE POSTIVE SARS-CoV-2 nucleic acids MAY BE PRESENT.   A  presumptive positive result was obtained on the submitted specimen  and confirmed on repeat testing.  While 2019 novel coronavirus  (SARS-CoV-2) nucleic acids may be present in the submitted sample  additional confirmatory testing may be necessary for epidemiological  and / or clinical management purposes  to differentiate between  SARS-CoV-2 and other Sarbecovirus currently known to infect humans.  If clinically indicated additional testing with an alternate test  methodology [phone]) is advised. The SARS-CoV-2 RNA is generally  detectable in upper and lower respiratory sp ecimens during the acute  phase of infection. The expected result is Negative. Fact Sheet for Patients:  StrictlyIdeas.no Fact Sheet for Healthcare Providers: BankingDealers.co.za This test is not yet approved or cleared by the [place] FDA and has been authorized for detection and/or diagnosis of SARS-CoV-2 by FDA under an Emergency Use Authorization (EUA).  This EUA will remain in effect (meaning this test can be used) for the duration of the COVID-19 declaration under Section 564(b)(1) of the Act, 21 U.S.C. section 360bbb-3(b)(1), unless the authorization is terminated or revoked sooner. Performed at [facility], [address] [place], [place] [address]   Urine culture     Status: None   Collection Time: 03/26/19  8:51 PM   Specimen: In/Out Cath Urine  Result Value Ref Range Status   Specimen Description   Final    IN/OUT CATH URINE Performed at [facility], [address], [place], [place] [address]    Special Requests   Final    NONE Performed at [facility], [address] [place], [place] [address]    Culture   Final    NO GROWTH Performed at [facility], [place] [address], [place], [place] [address]    Report Status 03/27/2019 FINAL  Final  MRSA PCR Screening     Status: None   Collection Time: 03/27/19  10:00 AM   Specimen: Nasal Mucosa; Nasopharyngeal  Result Value Ref Range Status   MRSA by PCR NEGATIVE NEGATIVE Final    Comment:        The GeneXpert MRSA Assay (FDA approved for NASAL specimens only), is one component of a comprehensive MRSA colonization surveillance program. It is not intended to diagnose MRSA infection nor to guide or monitor treatment for MRSA infections. Performed at [facility], [address] [place], [place] [address]   CULTURE, BLOOD (ROUTINE X 2) w Reflex to ID Panel     Status: None (Preliminary result)   Collection Time: 03/30/19  9:13 AM   Specimen: BLOOD  Result Value Ref Range Status   Specimen Description BLOOD R HAND  Final   Special Requests   Final    BOTTLES DRAWN AEROBIC AND ANAEROBIC Blood Culture adequate volume   Culture   Final  NO GROWTH 2 DAYS Performed at [facility], [address] [place], [place] [address]    Report Status PENDING  Incomplete  CULTURE, BLOOD (ROUTINE X 2) w Reflex to ID Panel     Status: None (Preliminary result)   Collection Time: 03/30/19  9:21 AM   Specimen: BLOOD  Result Value Ref Range Status   Specimen Description BLOOD R AC  Final   Special Requests   Final    BOTTLES DRAWN AEROBIC AND ANAEROBIC Blood Culture adequate volume   Culture   Final    NO GROWTH 2 DAYS Performed at [facility], [address], [place], [place] [address]    Report Status PENDING  Incomplete  Urine Culture     Status: None   Collection Time: 03/30/19  9:21 AM   Specimen: Urine, Catheterized  Result Value Ref Range Status   Specimen Description   Final    URINE, CATHETERIZED Performed at [facility], [address], [place], [place] [address]    Special Requests   Final    NONE Performed at [facility], [address] [place], [place] [address]    Culture   Final    NO GROWTH Performed at [facility], [place] [address] [place], [place] [address]     Report Status 03/31/2019 FINAL  Final   Studies/Results: Ct Bone Marrow Biopsy & Aspiration  Result Date: 04/01/2019 INDICATION: 73 year old male with pancytopenia EXAM: CT GUIDED BONE MARROW ASPIRATION AND CORE BIOPSY Interventional Radiologist:  Criselda Peaches, MD MEDICATIONS: None. ANESTHESIA/SEDATION: Moderate (conscious) sedation was employed during this procedure. A total of 2 milligrams versed and 75 micrograms fentanyl were administered intravenously. The patient's level of consciousness and vital signs were monitored continuously by radiology nursing throughout the procedure under my direct supervision. Total monitored sedation time: 10 minutes FLUOROSCOPY TIME:  None COMPLICATIONS: None immediate. Estimated blood loss: <25 mL PROCEDURE: Informed written consent was obtained from the patient after a thorough discussion of the procedural risks, benefits and alternatives. All questions were addressed. Maximal Sterile Barrier Technique was utilized including caps, mask, sterile gowns, sterile gloves, sterile drape, hand hygiene and skin antiseptic. A timeout was performed prior to the initiation of the procedure. The patient was positioned prone and non-contrast localization CT was performed of the pelvis to demonstrate the iliac marrow spaces. Maximal barrier sterile technique utilized including caps, mask, sterile gowns, sterile gloves, large sterile drape, hand hygiene, and betadine prep. Under sterile conditions and local anesthesia, an 11 gauge coaxial bone biopsy needle was advanced into the right iliac marrow space. Needle position was confirmed with CT imaging. Initially, bone marrow aspiration was performed. Next, the 11 gauge outer cannula was utilized to obtain a right iliac bone marrow core biopsy. Needle was removed. Hemostasis was obtained with compression. The patient tolerated the procedure well. Samples were prepared with the cytotechnologist. IMPRESSION: Technically successful  CT-guided bone marrow aspiration and core biopsy of the right iliac bone. Electronically Signed   By: Jacqulynn Cadet M.D.   On: 04/01/2019 15:53   Medications:  Scheduled Meds:  ALPRAZolam  0.5-1 mg Oral BID   aspirin EC  81 mg Oral Daily   atorvastatin  20 mg Oral Daily   Chlorhexidine Gluconate Cloth  6 each Topical Daily   cholecalciferol  1,000 Units Oral Daily   furosemide  40 mg Oral Daily   gabapentin  1,200 mg Oral QHS   gabapentin  300 mg Oral Daily  insulin aspart  0-5 Units Subcutaneous QHS   insulin aspart  0-9 Units Subcutaneous TID WC   lacosamide  100 mg Oral BID   metoprolol succinate  50 mg Oral BID   omega-3 acid ethyl esters  1,000 mg Oral Daily   pantoprazole  40 mg Oral Daily   PARoxetine  20 mg Oral Daily   PHENObarbital  65 mg Intravenous BID   [START ON 04/03/2019] PHENObarbital  65 mg Intravenous Once   sodium chloride flush  10-40 mL Intracatheter Q12H   tamsulosin  0.4 mg Oral Daily   vitamin B-12  2,000 mcg Oral Daily   Continuous Infusions:  sodium chloride Stopped (04/01/19 1317)   PRN Meds:.acetaminophen **OR** acetaminophen, acetaminophen-codeine, docusate sodium, ipratropium-albuterol, LORazepam, morphine injection, nitroGLYCERIN, opium-belladonna, polyethylene glycol, sodium chloride flush   Assessment: Active Problems:   Sepsis ([place]) Anemia   Plan: Patient continues to have fevers, with temperature of 100.9 yesterday, 100.2 today Patient has leukopenia, thrombocytopenia and anemia.  Bone marrow biopsy being discussed by hematology  Once his fevers resolve, can consider endoscopy when he is medically optimized either as an inpatient or outpatient depending on clinical condition  Await bone marrow biopsy  High risk for endoscopic procedures at this time given sepsis and fevers   LOS: 5 days   Mike Antigua, MD 04/01/2019, 4:20 PM

## 2019-04-01 NOTE — Progress Notes (Signed)
[place] [facility] [place] NAME: Mike [name]    MR#:  [id number]  DATE OF BIRTH:  1946/01/18  SUBJECTIVE:  Patient had fevers with temperature of 100.9 yesterday.   Seen by oncologist and having bone marrow biopsy done today.  Did drop in hemoglobin to 6.8 and to be transfused with 1 unit of packed red blood cells today.  Reported to have been intermittently agitated.  Placed on as needed Ativan. Updated sister at bedside and treatment plans as outlined below  REVIEW OF SYSTEMS:    Review of Systems  Constitutional: Positive for fever. Negative for chills.  HENT: Negative for congestion and tinnitus.   Eyes: Negative for blurred vision and double vision.  Respiratory: Negative for cough, shortness of breath and wheezing.   Cardiovascular: Negative for chest pain, orthopnea and PND.  Gastrointestinal: Negative for abdominal pain, diarrhea, nausea and vomiting.  Genitourinary: Negative for dysuria and hematuria.  Musculoskeletal: Negative for falls.       Right shoulder pain  Neurological: Positive for weakness (generalized). Negative for dizziness, sensory change and focal weakness.  All other systems reviewed and are negative.   DRUG ALLERGIES:   Allergies  Allergen Reactions  . Naproxen Sodium Other (See Comments)    Reaction: Upset stomach  . Nsaids Other (See Comments)    Other reaction(s): Unknown  . Aleve [Naproxen] Nausea And Vomiting  . Ibuprofen Nausea Only    Reaction: Upset stomach     VITALS:  Blood pressure 127/60, pulse 70, temperature 98.8 F (37.1 C), temperature source Oral, resp. rate 18, height _0  (1.702 m), weight 90 kg, SpO2 96 %.  PHYSICAL EXAMINATION:   Physical Exam  GENERAL:  73 y.o.-year-old obese patient lying in bed in no acute distress.  EYES: Pupils equal, round, reactive to light and accommodation. No scleral icterus. Extraocular muscles intact.  HEENT: Head atraumatic, normocephalic. Oropharynx  and nasopharynx clear.  NECK:  Supple, no jugular venous distention. No thyroid enlargement, no tenderness.  LUNGS: Normal breath sounds bilaterally, no wheezing, rales, rhonchi. No use of accessory muscles of respiration.  CARDIOVASCULAR: S1, S2 normal. No murmurs, rubs, or gallops.  ABDOMEN: Soft, nontender, nondistended. Bowel sounds present. No organomegaly or mass.  EXTREMITIES: No cyanosis, clubbing or edema b/l.    NEUROLOGIC: Cranial nerves II through XII are intact. No focal Motor or sensory deficits b/l.  Globally weak.  PSYCHIATRIC: The patient is alert and oriented x 3.  SKIN: No obvious rash, lesion, or ulcer.   Rectum - Redness around the per-rectal and area.    LABORATORY PANEL:   CBC Recent Labs  Lab 04/01/19 0747  WBC 3.1*  3.0*  HGB 6.8*  6.9*  HCT 21.4*  21.8*  PLT 118*  120*   ------------------------------------------------------------------------------------------------------------------  Chemistries  Recent Labs  Lab 03/26/19 2012  04/01/19 0747  NA 138   < > 133*  K 4.4   < > 3.8  CL 100   < > 98  CO2 27   < > 23  GLUCOSE 197*   < > 228*  BUN 30*   < > 15  CREATININE 1.43*   < > 0.92  CALCIUM 9.5   < > 7.9*  MG  --    < > 2.0  AST 20  --   --   ALT 14  --   --   ALKPHOS 116  --   --   BILITOT 0.9  --   --    < > =  values in this interval not displayed.   ------------------------------------------------------------------------------------------------------------------  Cardiac Enzymes No results for input(s): TROPONINI in the last 168 hours. ------------------------------------------------------------------------------------------------------------------  RADIOLOGY:  No results found.   ASSESSMENT AND PLAN:   73 year old male with past medical history of diabetes, seizures, hypertension, hyperlipidemia, history of prostate cancer, anxiety, GERD, history of coronary disease to mitral valve regurgitation who presented to the hospital  due to fever, fall and shortness of breath.  1.  Sepsis-patient met criteria on admission given his fever, tachycardia recently. -Source of sepsis remains unclear.  Recent chest x-ray is negative for acute pneumonia, urinalysis is negative, patient's COVID-19 test is negative. - MRSA PCR negative.  Patient has been placed on broad-spectrum IV antibiotics including vancomycin , cefepime and Flagyl but patient continued to spike fevers.  No evidence of any meningeal signs.  So far no evidence of infectious process found. Consulted infectious disease specialist Mike [name] saw patient today thinks fever does not appear to be infectious.  Infectious disease specialist feels the fevers is most likely related to phenobarbital which patient takes for seizures.  Recommended discussing with patient's physicians at [facility] to try to wean off phenobarbital.  I discussed with patient's sister about recommendations who confirmed patient has no prior psychiatric history.  Patient is on the phenobarbital for seizures since childhood.  Patient's doctor at [facility] has retired and patient has been followed up at [facility] clinic with Mike [name] over the last 1 year.  Called and discussed case with primary care physician Mike [name] who also has been considering gradually getting patient off of phenobarbital.  He agreed with getting neurology to assist with weaning patient off phenobarbital and initiating a different seizure medication. Patient being followed by neurologist and being weaned off phenobarbital.  Was started on Vimpat.  2.  Pancytopenia- etiology unclear presently. Stable -Seen by hematology oncology and as per them patient has a baseline microcytic anemia now has poor reserves and therefore possibly could be underlying iron deficiency. -No evidence of hemolysis based on blood work.  Patient reevaluated by oncologist and having bone marrow biopsy done today.  Noted drop in hemoglobin to 6.8.  Being transfused with 1  unit of packed red blood cell today.  Flow cytometry pending.  History of prostate cancer.  Last PSA documented 0.92. Patient was evaluated by hematologist and given patient's microcytic anemia and no GI evaluation since 2004, she recommended getting GI consult.  Patient seen by Dr. Vicente Males with plans to consider EGD and colonoscopy after fever is resolved.  This can be done as inpatient or outpatient.  3. Abdominal/Rectal/Lower back pain -patient had a fall prior to admission.   Due to complaints of abdominal pains yesterday patient had CT abdomen and pelvis done with no acute findings apart from distended urinary bladder which is being managed as outlined below.  X-ray of the lumbar spine and pelvis negative for any acute findings. This morning patient complained of right shoulder pain.  X-rays of right shoulder with no acute findings.  4.  History of seizures- no acute seizure activity.  I have placed neurology consult to assist with weaning of phenobarbital which is thought to be causing patient's fevers.  Neurologist assisting with weaning off phenobarbital.  Patient started on Vimpat.  5.  GERD- continue Protonix.  6.  Anxiety- continue Paxil, Xanax.  7.  Essential hypertension- continue Toprol.  8.  Neuropathy- continue gabapentin.  9.  Chronic Diastolic CHF - clinically not in CHF.  - cont. Lasix, Toprol.  10.  Urinary retention Noted distended urinary bladder on CT scan.  In and out catheterization done recently with about 1.5 L of urine output.  Yesterday evening patient had repeat urinary retention and Foley catheter was placed and left in place with at least 1.3 L of urine output.  Flomax already initiated yesterday. I discussed case urologist on call today Dr. Jeffie Pollock who recommended leaving Foley catheter in place continue Flomax which recommendation to follow-up with urology clinic in 1 week for possible voiding trial. Patient appears to be having some bladder spasm.  Placed on  B&O suppositories as needed.  Physical therapy working with patient.  Recommended skilled nursing facility placement on discharge.  DVT prophylaxis; SCDs No heparin products due to thrombocytopenia  All the records are reviewed and case discussed with Care Management/Social Worker. Management plans discussed with the patient, family and they are in agreement. Updated patient sister present at bedside on treatment plans and all questions were answered.  CODE STATUS: Full code  DVT Prophylaxis: Ted's & SCD's.   TOTAL TIME TAKING CARE OF THIS PATIENT: 33 minutes.   POSSIBLE D/C IN 3DAYS, DEPENDING ON CLINICAL CONDITION.   Melvin Marmo M.D on 04/01/2019 at 2:08 PM  Between 7am to 6pm - Pager - [phone]  After 6pm go to [email or web address] - Proofreader  [facility]  Office  [phone]  CC: Primary care physician; Adin Hector, MD

## 2019-04-01 NOTE — Progress Notes (Signed)
Inpatient Diabetes Program Recommendations  AACE/ADA: New Consensus Statement on Inpatient Glycemic Control   Target Ranges:  Prepandial:   less than 140 mg/dL      Peak postprandial:   less than 180 mg/dL (1-2 hours)      Critically ill patients:  140 - 180 mg/dL  Results for Mike [name], Mike [name] (MRN [id number]) as of 04/01/2019 09:47  Ref. Range 04/01/2019 07:47  Glucose Latest Ref Range: 70 - 99 mg/dL 228 (H)   Results for [name], [name] (MRN [id number]) as of 04/01/2019 09:47  Ref. Range 03/31/2019 07:49 03/31/2019 11:56 03/31/2019 16:36 03/31/2019 21:18  Glucose-Capillary Latest Ref Range: 70 - 99 mg/dL 195 (H) 204 (H) 330 (H) 264 (H)    Review of Glycemic Control  Diabetes history: DM2 Outpatient Diabetes medications: Amaryl 4 mg QAM, Amaryl 2 mg QPM, Actos 30 mg daily Current orders for Inpatient glycemic control: Novolog 0-9 units TID with meals, Novolog 0-5 units QHS  Inpatient Diabetes Program Recommendations:   Insulin-Basal: Please consider ordering Lantus 7 units daily.  Thanks, Barnie Alderman, RN, MSN, CDE Diabetes Coordinator Inpatient Diabetes Program [phone] (Team Pager from 8am to 5pm)

## 2019-04-01 NOTE — Progress Notes (Signed)
PT Cancellation Note  Patient Details Name: Mike [name] MRN: [id number] DOB: 03-27-1946   Cancelled Treatment:    Reason Eval/Treat Not Completed: Medical issues which prohibited therapy.  Chart reviewed.  Pt's Hgb noted to be 6.8 this morning and order in chart noted for planned RBC transfusion.  Per PT guidelines for low Hgb, pt currently contraindicated for participation in physical therapy.  Will hold PT at this time and re-attempt PT treatment session at a later date/time as medically appropriate.  Leitha Bleak, PT 04/01/19, 12:04 PM [phone]

## 2019-04-02 ENCOUNTER — Inpatient Hospital Stay: Payer: Medicare Other

## 2019-04-02 DIAGNOSIS — W19XXXA Unspecified fall, initial encounter: Secondary | ICD-10-CM

## 2019-04-02 LAB — CBC
HCT: 26 % — ABNORMAL LOW (ref 39.0–52.0)
Hemoglobin: 8.5 g/dL — ABNORMAL LOW (ref 13.0–17.0)
MCH: 24.3 pg — ABNORMAL LOW (ref 26.0–34.0)
MCHC: 32.7 g/dL (ref 30.0–36.0)
MCV: 74.3 fL — ABNORMAL LOW (ref 80.0–100.0)
Platelets: 127 10*3/uL — ABNORMAL LOW (ref 150–400)
RBC: 3.5 MIL/uL — ABNORMAL LOW (ref 4.22–5.81)
RDW: 21.8 % — ABNORMAL HIGH (ref 11.5–15.5)
WBC: 3.5 10*3/uL — ABNORMAL LOW (ref 4.0–10.5)
nRBC: 0.9 % — ABNORMAL HIGH (ref 0.0–0.2)

## 2019-04-02 LAB — BASIC METABOLIC PANEL
Anion gap: 16 — ABNORMAL HIGH (ref 5–15)
BUN: 13 mg/dL (ref 8–23)
CO2: 21 mmol/L — ABNORMAL LOW (ref 22–32)
Calcium: 8.1 mg/dL — ABNORMAL LOW (ref 8.9–10.3)
Chloride: 99 mmol/L (ref 98–111)
Creatinine, Ser: 0.87 mg/dL (ref 0.61–1.24)
GFR calc Af Amer: >60 mL/min (ref >60)
GFR calc non Af Amer: >60 mL/min (ref >60)
Glucose, Bld: 173 mg/dL — ABNORMAL HIGH (ref 70–99)
Potassium: 3.4 mmol/L — ABNORMAL LOW (ref 3.5–5.1)
Sodium: 136 mmol/L (ref 135–145)

## 2019-04-02 LAB — TYPE AND SCREEN
ABO/RH(D): O POS
Antibody Screen: NEGATIVE
Unit division: 0

## 2019-04-02 LAB — GLUCOSE, CAPILLARY
Glucose-Capillary: 171 mg/dL — ABNORMAL HIGH (ref 70–99)
Glucose-Capillary: 200 mg/dL — ABNORMAL HIGH (ref 70–99)
Glucose-Capillary: 200 mg/dL — ABNORMAL HIGH (ref 70–99)
Glucose-Capillary: 287 mg/dL — ABNORMAL HIGH (ref 70–99)

## 2019-04-02 LAB — BPAM RBC
Blood Product Expiration Date: 202010182359
ISSUE DATE / TIME: 202010131410
Unit Type and Rh: 5100

## 2019-04-02 LAB — MAGNESIUM: Magnesium: 1.8 mg/dL (ref 1.7–2.4)

## 2019-04-02 MED ORDER — POTASSIUM CHLORIDE CRYS ER 20 MEQ PO TBCR
40.0000 meq | EXTENDED_RELEASE_TABLET | Freq: Once | ORAL | Status: AC
  Administered 2019-04-02: 09:00:00 40 meq via ORAL
  Filled 2019-04-02: qty 2

## 2019-04-02 MED ORDER — NEPRO/CARBSTEADY PO LIQD
237.0000 mL | Freq: Two times a day (BID) | ORAL | Status: DC
  Administered 2019-04-02 – 2019-04-09 (×12): 237 mL via ORAL

## 2019-04-02 MED ORDER — INSULIN GLARGINE 100 UNIT/ML ~~LOC~~ SOLN
5.0000 [IU] | Freq: Every day | SUBCUTANEOUS | Status: DC
  Administered 2019-04-02 – 2019-04-03 (×2): 5 [IU] via SUBCUTANEOUS
  Filled 2019-04-02 (×2): qty 0.05

## 2019-04-02 MED ORDER — ADULT MULTIVITAMIN W/MINERALS CH
1.0000 | ORAL_TABLET | Freq: Every day | ORAL | Status: DC
  Administered 2019-04-03 – 2019-04-09 (×7): 1 via ORAL
  Filled 2019-04-02 (×7): qty 1

## 2019-04-02 MED ORDER — PHENOBARBITAL SODIUM 65 MG/ML IJ SOLN
65.0000 mg | Freq: Once | INTRAMUSCULAR | Status: AC
  Administered 2019-04-03: 11:00:00 65 mg via INTRAVENOUS
  Filled 2019-04-02 (×2): qty 1

## 2019-04-02 MED ORDER — KETOROLAC TROMETHAMINE 30 MG/ML IJ SOLN
15.0000 mg | Freq: Once | INTRAMUSCULAR | Status: AC
  Administered 2019-04-03: 15 mg via INTRAVENOUS
  Filled 2019-04-02: qty 1

## 2019-04-02 NOTE — Progress Notes (Signed)
Dr. Jannifer Franklin notified of temp 101.5, and MEWS score of 3. VS stable, New orders placed. Patient alert to voice and resting calmly.  Will continue to monitor.

## 2019-04-02 NOTE — Progress Notes (Signed)
Subjective: agitation improved. No seizures  Past Medical History:  Diagnosis Date  . Anxiety   . Diabetes mellitus without complication ([place])   . Epilepsy ([place])   . High cholesterol   . Hypertension   . Prostate cancer [facility]) 2004   prostate removed    Past Surgical History:  Procedure Laterality Date  . CARDIAC SURGERY    . IRRIGATION AND DEBRIDEMENT FOOT Right 06/14/2018   Procedure: IRRIGATION AND DEBRIDEMENT FOOT;  Surgeon: Sharlotte Alamo, DPM;  Location: [facility] ORS;  Service: Podiatry;  Laterality: Right;    No family history on file.  Social History:  reports that he has never smoked. He has never used smokeless tobacco. He reports that he does not drink alcohol or use drugs.  Allergies  Allergen Reactions  . Naproxen Sodium Other (See Comments)    Reaction: Upset stomach  . Nsaids Other (See Comments)    Other reaction(s): Unknown  . Aleve [Naproxen] Nausea And Vomiting  . Ibuprofen Nausea Only    Reaction: Upset stomach     Medications: I have reviewed the patient's current medications.  ROS: Unable to obtain due to confusion   Physical Examination: Blood pressure (!) 136/54, pulse 74, temperature 100.1 F (37.8 C), temperature source Oral, resp. rate 16, height '5\' 7"'  (1.702 m), weight 90 kg, SpO2 96 %. Neurological Examination   Mental Status: Alert, oriented to name only  Cranial Nerves: II: Discs flat bilaterally; Visual fields grossly normal, pupils equal, round, reactive to light and accommodation III,IV, VI: ptosis not present, extra-ocular motions intact bilaterally V,VII: smile symmetric, facial light touch sensation normal bilaterally VIII: hearing normal bilaterally IX,X: gag reflex present XI: bilateral shoulder shrug XII: midline tongue extension Motor: Generalized weakness.  Tone and bulk:normal tone throughout; no atrophy noted Sensory: Pinprick and light touch intact throughout, bilaterally Deep Tendon Reflexes: 1+ and symmetric  throughout Plantars: Right: downgoing   Left: downgoing Cerebellar: Not tested     Laboratory Studies:   Basic Metabolic Panel: Recent Labs  Lab 03/28/19 0543 03/29/19 0448 03/31/19 0628 04/01/19 0747 04/02/19 0648  NA 141 136 134* 133* 136  K 3.9 3.9 3.4* 3.8 3.4*  CL 108 106 101 98 99  CO2 '23 23 25 23 ' 21*  GLUCOSE 152* 203* 223* 228* 173*  BUN 25* '21 15 15 13  ' CREATININE 1.22 0.99 0.86 0.92 0.87  CALCIUM 8.3* 8.1* 7.8* 7.9* 8.1*  MG  --   --  1.6* 2.0 1.8    Liver Function Tests: Recent Labs  Lab 03/26/19 2012  AST 20  ALT 14  ALKPHOS 116  BILITOT 0.9  PROT 8.3*  ALBUMIN 4.5   No results for input(s): LIPASE, AMYLASE in the last 168 hours. No results for input(s): AMMONIA in the last 168 hours.  CBC: Recent Labs  Lab 03/26/19 2012  03/30/19 1093 03/31/19 0628 03/31/19 0914 04/01/19 0747 04/02/19 0648  WBC 2.8*   < > 3.1* 2.7* 2.8* 3.1*  3.0* 3.5*  NEUTROABS 0.8*  --  0.9*  --  1.0* 1.1*  --   HGB 7.9*   < > 8.3* 6.9* 7.3*  7.2* 6.8*  6.9* 8.5*  HCT 25.5*   < > 26.7* 21.6* 22.9*  22.9* 21.4*  21.8* 26.0*  MCV 69.1*   < > 75.0* 72.0* 72.7* 72.3*  72.2* 74.3*  PLT 148*   < > 103* 101* 108* 118*  120* 127*   < > = values in this interval not displayed.    Cardiac Enzymes:  No results for input(s): CKTOTAL, CKMB, CKMBINDEX, TROPONINI in the last 168 hours.  BNP: Invalid input(s): POCBNP  CBG: Recent Labs  Lab 04/01/19 0751 04/01/19 1201 04/01/19 1721 04/01/19 2114 04/02/19 0804  GLUCAP 218* 215* 225* 221* 171*    Microbiology: Results for orders placed or performed during the hospital encounter of 03/26/19  Culture, blood (Routine x 2)     Status: None   Collection Time: 03/26/19  8:12 PM   Specimen: BLOOD  Result Value Ref Range Status   Specimen Description BLOOD LEFT ANTECUBITAL  Final   Special Requests   Final    BOTTLES DRAWN AEROBIC AND ANAEROBIC Blood Culture adequate volume   Culture   Final    NO GROWTH 5  DAYS Performed at [facility], [address], [place], [place] [address]    Report Status 03/31/2019 FINAL  Final  Culture, blood (Routine x 2)     Status: None   Collection Time: 03/26/19  8:50 PM   Specimen: BLOOD  Result Value Ref Range Status   Specimen Description BLOOD BLOOD RIGHT HAND  Final   Special Requests   Final    BOTTLES DRAWN AEROBIC AND ANAEROBIC Blood Culture adequate volume   Culture   Final    NO GROWTH 5 DAYS Performed at [facility], [address] [place],  [address]    Report Status 03/31/2019 FINAL  Final  SARS Coronavirus 2 [facility] order, Performed in [facility] hospital lab) Nasopharyngeal Nasopharyngeal Swab     Status: None   Collection Time: 03/26/19  8:50 PM   Specimen: Nasopharyngeal Swab  Result Value Ref Range Status   SARS Coronavirus 2 NEGATIVE NEGATIVE Final    Comment: (NOTE) If result is NEGATIVE SARS-CoV-2 target nucleic acids are NOT DETECTED. The SARS-CoV-2 RNA is generally detectable in upper and lower  respiratory specimens during the acute phase of infection. The lowest  concentration of SARS-CoV-2 viral copies this assay can detect is 250  copies / mL. A negative result does not preclude SARS-CoV-2 infection  and should not be used as the sole basis for treatment or other  patient management decisions.  A negative result may occur with  improper specimen collection / handling, submission of specimen other  than nasopharyngeal swab, presence of viral mutation(s) within the  areas targeted by this assay, and inadequate number of viral copies  (<250 copies / mL). A negative result must be combined with clinical  observations, patient history, and epidemiological information. If result is POSITIVE SARS-CoV-2 target nucleic acids are DETECTED. The SARS-CoV-2 RNA is generally detectable in upper and lower  respiratory specimens dur ing the acute phase of infection.  Positive  results are indicative of  active infection with SARS-CoV-2.  Clinical  correlation with patient history and other diagnostic information is  necessary to determine patient infection status.  Positive results do  not rule out bacterial infection or co-infection with other viruses. If result is PRESUMPTIVE POSTIVE SARS-CoV-2 nucleic acids MAY BE PRESENT.   A presumptive positive result was obtained on the submitted specimen  and confirmed on repeat testing.  While 2019 novel coronavirus  (SARS-CoV-2) nucleic acids may be present in the submitted sample  additional confirmatory testing may be necessary for epidemiological  and / or clinical management purposes  to differentiate between  SARS-CoV-2 and other Sarbecovirus currently known to infect humans.  If clinically indicated additional testing with an alternate test  methodology [phone]) is advised. The SARS-CoV-2 RNA is generally  detectable in upper and lower respiratory sp ecimens during the acute  phase of infection. The expected result is Negative. Fact Sheet for Patients:  StrictlyIdeas.no Fact Sheet for Healthcare Providers: BankingDealers.co.za This test is not yet approved or cleared by the [place] FDA and has been authorized for detection and/or diagnosis of SARS-CoV-2 by FDA under an Emergency Use Authorization (EUA).  This EUA will remain in effect (meaning this test can be used) for the duration of the COVID-19 declaration under Section 564(b)(1) of the Act, 21 U.S.C. section 360bbb-3(b)(1), unless the authorization is terminated or revoked sooner. Performed at [facility], [address], [place], [place] [address]   Urine culture     Status: None   Collection Time: 03/26/19  8:51 PM   Specimen: In/Out Cath Urine  Result Value Ref Range Status   Specimen Description   Final    IN/OUT CATH URINE Performed at [facility], [address] [place], [place] [address]     Special Requests   Final    NONE Performed at [facility], [address] [place], [place] [address]    Culture   Final    NO GROWTH Performed at [facility], [place] [address] [place], [place] [address]    Report Status 03/27/2019 FINAL  Final  MRSA PCR Screening     Status: None   Collection Time: 03/27/19 10:00 AM   Specimen: Nasal Mucosa; Nasopharyngeal  Result Value Ref Range Status   MRSA by PCR NEGATIVE NEGATIVE Final    Comment:        The GeneXpert MRSA Assay (FDA approved for NASAL specimens only), is one component of a comprehensive MRSA colonization surveillance program. It is not intended to diagnose MRSA infection nor to guide or monitor treatment for MRSA infections. Performed at [facility], [address] [place], [place] [address]   CULTURE, BLOOD (ROUTINE X 2) w Reflex to ID Panel     Status: None (Preliminary result)   Collection Time: 03/30/19  9:13 AM   Specimen: BLOOD  Result Value Ref Range Status   Specimen Description BLOOD R HAND  Final   Special Requests   Final    BOTTLES DRAWN AEROBIC AND ANAEROBIC Blood Culture adequate volume   Culture   Final    NO GROWTH 3 DAYS Performed at [facility], [address] [place], [place] [address]    Report Status PENDING  Incomplete  CULTURE, BLOOD (ROUTINE X 2) w Reflex to ID Panel     Status: None (Preliminary result)   Collection Time: 03/30/19  9:21 AM   Specimen: BLOOD  Result Value Ref Range Status   Specimen Description BLOOD R AC  Final   Special Requests   Final    BOTTLES DRAWN AEROBIC AND ANAEROBIC Blood Culture adequate volume   Culture   Final    NO GROWTH 3 DAYS Performed at [facility], [address], [place], [place] [address]    Report Status PENDING  Incomplete  Urine Culture     Status: None   Collection Time: 03/30/19  9:21 AM   Specimen: Urine, Catheterized  Result Value Ref Range Status   Specimen Description   Final     URINE, CATHETERIZED Performed at [facility], [address], [place], [place] [address]    Special Requests   Final    NONE Performed at [facility], [address] [place],  [address]  Culture   Final    NO GROWTH Performed at [facility], [place] [address], [place], [place] [address]    Report Status 03/31/2019 FINAL  Final    Coagulation Studies: No results for input(s): LABPROT, INR in the last 72 hours.  Urinalysis:  Recent Labs  Lab 03/26/19 2051 03/30/19 0921  COLORURINE YELLOW* YELLOW*  LABSPEC 1.012 1.016  PHURINE 7.0 5.0  GLUCOSEU >=500* >=500*  HGBUR NEGATIVE MODERATE*  BILIRUBINUR NEGATIVE NEGATIVE  KETONESUR NEGATIVE 20*  PROTEINUR NEGATIVE 100*  NITRITE NEGATIVE NEGATIVE  LEUKOCYTESUR NEGATIVE TRACE*    Lipid Panel:  No results found for: CHOL, TRIG, HDL, CHOLHDL, VLDL, LDLCALC  HgbA1C:  Lab Results  Component Value Date   HGBA1C 9.1 (H) 03/27/2019    Urine Drug Screen:  No results found for: LABOPIA, COCAINSCRNUR, LABBENZ, AMPHETMU, THCU, LABBARB  Alcohol Level: No results for input(s): ETH in the last 168 hours.  Other results: EKG: normal EKG, normal sinus rhythm, unchanged from previous tracings.  Imaging: Ct Bone Marrow Biopsy & Aspiration  Result Date: 04/01/2019 INDICATION: 73 year old male with pancytopenia EXAM: CT GUIDED BONE MARROW ASPIRATION AND CORE BIOPSY Interventional Radiologist:  Criselda Peaches, MD MEDICATIONS: None. ANESTHESIA/SEDATION: Moderate (conscious) sedation was employed during this procedure. A total of 2 milligrams versed and 75 micrograms fentanyl were administered intravenously. The patient's level of consciousness and vital signs were monitored continuously by radiology nursing throughout the procedure under my direct supervision. Total monitored sedation time: 10 minutes FLUOROSCOPY TIME:  None COMPLICATIONS: None immediate. Estimated blood loss: <25 mL PROCEDURE: Informed  written consent was obtained from the patient after a thorough discussion of the procedural risks, benefits and alternatives. All questions were addressed. Maximal Sterile Barrier Technique was utilized including caps, mask, sterile gowns, sterile gloves, sterile drape, hand hygiene and skin antiseptic. A timeout was performed prior to the initiation of the procedure. The patient was positioned prone and non-contrast localization CT was performed of the pelvis to demonstrate the iliac marrow spaces. Maximal barrier sterile technique utilized including caps, mask, sterile gowns, sterile gloves, large sterile drape, hand hygiene, and betadine prep. Under sterile conditions and local anesthesia, an 11 gauge coaxial bone biopsy needle was advanced into the right iliac marrow space. Needle position was confirmed with CT imaging. Initially, bone marrow aspiration was performed. Next, the 11 gauge outer cannula was utilized to obtain a right iliac bone marrow core biopsy. Needle was removed. Hemostasis was obtained with compression. The patient tolerated the procedure well. Samples were prepared with the cytotechnologist. IMPRESSION: Technically successful CT-guided bone marrow aspiration and core biopsy of the right iliac bone. Electronically Signed   By: Jacqulynn Cadet M.D.   On: 04/01/2019 15:53     Assessment/Plan:  73 y.o. male with hx diabetes, seizures, hypertension, hyperlipidemia, history of prostate cancer, anxiety, GERD, history of coronary disease to mitral valve regurgitation who presented to the hospital due to fever, fall and shortness of breath. Found to be septic without unclear source along with pancytopenia without clear etiology.  Patient has been on phenobarbital for 6 plus decades with 142m daily.  There has been no reported recent seizure activity.    - On vimpat and being titrated off phenobarbital over next 3 days - no further seizures - s/p bone marrow biopsy with results  pending   04/02/2019, 11:40 AM

## 2019-04-02 NOTE — Progress Notes (Signed)
Initial Nutrition Assessment  DOCUMENTATION CODES:   Obesity unspecified  INTERVENTION:   Nepro Shake po BID, each supplement provides 425 kcal and 19 grams protein  MVI daily   Recommend check copper and ceruloplasmin labs  Bowel regimen as needed per MD  NUTRITION DIAGNOSIS:   Inadequate oral intake related to acute illness as evidenced by meal completion < 25%.  GOAL:   Patient will meet greater than or equal to 90% of their needs  MONITOR:   PO intake, Supplement acceptance, Labs, Weight trends, Skin, I & O's  REASON FOR ASSESSMENT:   Consult Assessment of nutrition requirement/status  ASSESSMENT:   73 y.o. male with hx diabetes, seizures, hypertension, hyperlipidemia, history of prostate cancer s/ resection, anxiety, GERD, history of coronary disease to mitral valve regurgitation who presented to the hospital due to fever, fall and shortness of breath. Found to be septic without unclear source along with pancytopenia without clear etiology. [place] completed 10/13- biopsy pending  RD working remotely.  Unable to speak with patient r/t agitation and AMS. Per RN, patient with poor appetite and oral intake in hospital. RD will add supplements and MVI to help pt meet his estimated needs. Pt noted to have type 1 BM yesterday; would recommend bowel regimen as needed per MD. Pt with anemia, leukopenia and admitted with fall; would recommend check copper and ceruloplasmin labs to r/o deficiency. Per chart, pt appears fairly weight stable pta.     Medications reviewed and include: aspirin, D3, lasix, insulin, lovaza, protonix, paxil, B12, NaCl @75ml /hr  Labs reviewed: K 3.4(L), Mg 1.8 wnl Wbc- 3.5(L), Hgb 8.5(L), Hct 26.0(L), MCV 74.3(L), MCH 24.3(L) Iron 23(L), TIBC 255, ferritin 425, folate 30.0, B12 2930(H)- 10/8 cbgs- 171, 200 x 24 hrs AIC 9.1(H)- 10/8  Unable to complete Nutrition-Focused physical exam at this time.   Diet Order:   Diet Order            Diet regular  Room service appropriate? Yes; Fluid consistency: Thin  Diet effective now             EDUCATION NEEDS:   No education needs have been identified at this time  Skin:  Skin Assessment: Reviewed RN Assessment(MASD)  Last BM:  10/13- type 1  Height:   Ht Readings from Last 1 Encounters:  03/26/19 5\' 7"  (1.702 m)    Weight:   Wt Readings from Last 1 Encounters:  03/26/19 90 kg    Ideal Body Weight:  67.3 kg  BMI:  Body mass index is 31.08 kg/m.  Estimated Nutritional Needs:   Kcal:  1800-2100kcal/day  Protein:  90-105g/day  Fluid:  1.7-2.0L/day  Koleen Distance MS, RD, LDN Pager #- [phone] Office#- [phone] After Hours Pager: [phone]

## 2019-04-02 NOTE — Progress Notes (Signed)
PT Cancellation Note  Patient Details Name: Mike [name] MRN: [id number] DOB: 07/30/1945   Cancelled Treatment:    Reason Eval/Treat Not Completed: Patient declined, no reason specified.  Pt resting in bed upon PT arrival.  Pt refusing to participate in any therapy activity (OOB or LE ex's in bed) d/t his 2 sisters were not there.  Therapist asked pt if he would participate in therapy if at least one of his sisters arrived and pt stated "maybe".  Attempted to encourage pt to participate in therapy but pt continued to refuse.  Will re-attempt PT treatment session at a later date/time.  Leitha Bleak, PT 04/02/19, 2:32 PM [phone]

## 2019-04-02 NOTE — Progress Notes (Signed)
Inpatient Diabetes Program Recommendations  AACE/ADA: New Consensus Statement on Inpatient Glycemic Control   Target Ranges:  Prepandial:   less than 140 mg/dL      Peak postprandial:   less than 180 mg/dL (1-2 hours)      Critically ill patients:  140 - 180 mg/dL   Results for KYUS, ZERKEL (MRN [id number]) as of 04/02/2019 10:36  Ref. Range 04/01/2019 07:51 04/01/2019 12:01 04/01/2019 17:21 04/01/2019 21:14 04/02/2019 08:04  Glucose-Capillary Latest Ref Range: 70 - 99 mg/dL 218 (H) 215 (H) 225 (H) 221 (H) 171 (H)   Review of Glycemic Control  Diabetes history:DM2 Outpatient Diabetes medications:Amaryl 4 mg QAM, Amaryl 2 mg QPM, Actos 30 mg daily Current orders for Inpatient glycemic control:Novolog 0-9 units TID with meals, Novolog 0-5 units QHS  Inpatient Diabetes Program Recommendations:  Insulin-Basal: Please consider ordering Lantus 5 units daily.  Thanks, Barnie Alderman, RN, MSN, CDE Diabetes Coordinator Inpatient Diabetes Program [phone] (Team Pager from 8am to 5pm)

## 2019-04-02 NOTE — Progress Notes (Signed)
[facility] [place] NAME: Mike [name]    MR#:  [id number]  DATE OF BIRTH:  [birth year]  SUBJECTIVE:  Patient with low-grade fever 100.1 early this morning.  No acute events overnight.  REVIEW OF SYSTEMS:    Review of Systems  Constitutional: ++ for fever, no chills weight loss HENT: Negative for ear pain, nosebleeds, congestion, facial swelling, rhinorrhea, neck pain, neck stiffness and ear discharge.   Respiratory: Negative for cough, shortness of breath, wheezing  Cardiovascular: Negative for chest pain, palpitations and leg swelling.  Gastrointestinal: Negative for heartburn, abdominal pain, vomiting, diarrhea or consitpation Genitourinary: Negative for dysuria, urgency, frequency, hematuria Musculoskeletal: Negative for back pain or joint pain Neurological: Negative for dizziness, seizures, syncope, focal weakness,  numbness and headaches.  Hematological: Does not bruise/bleed easily.  Psychiatric/Behavioral: Negative for hallucinations, confusion, dysphoric mood    Tolerating Diet: yes      DRUG ALLERGIES:   Allergies  Allergen Reactions  . Naproxen Sodium Other (See Comments)    Reaction: Upset stomach  . Nsaids Other (See Comments)    Other reaction(s): Unknown  . Aleve [Naproxen] Nausea And Vomiting  . Ibuprofen Nausea Only    Reaction: Upset stomach     VITALS:  Blood pressure (!) 136/54, pulse 74, temperature 100.1 F (37.8 C), temperature source Oral, resp. rate 16, height '5\' 7"'  (1.702 m), weight 90 kg, SpO2 96 %.  PHYSICAL EXAMINATION:  Constitutional: Appears well-developed and well-nourished. No distress. HENT: Normocephalic. Marland Kitchen Oropharynx is clear and moist.  Eyes: Conjunctivae and EOM are normal. PERRLA, no scleral icterus.  Neck: Normal ROM. Neck supple. No JVD. No tracheal deviation. CVS: RRR, S1/S2 +, no murmurs, no gallops, no carotid bruit.  Pulmonary: Effort and breath sounds normal, no stridor,  rhonchi, wheezes, rales.  Abdominal: Soft. BS +,  no distension, tenderness, rebound or guarding.  Musculoskeletal: Normal range of motion. No edema and no tenderness.  Neuro: Alert. CN 2-12 grossly intact. No focal deficits. Skin: Skin is warm and dry. No rash noted. Psychiatric: Normal mood and affect.      LABORATORY PANEL:   CBC Recent Labs  Lab 04/02/19 0648  WBC 3.5*  HGB 8.5*  HCT 26.0*  PLT 127*   ------------------------------------------------------------------------------------------------------------------  Chemistries  Recent Labs  Lab 03/26/19 2012  04/02/19 0648  NA 138   < > 136  K 4.4   < > 3.4*  CL 100   < > 99  CO2 27   < > 21*  GLUCOSE 197*   < > 173*  BUN 30*   < > 13  CREATININE 1.43*   < > 0.87  CALCIUM 9.5   < > 8.1*  MG  --    < > 1.8  AST 20  --   --   ALT 14  --   --   ALKPHOS 116  --   --   BILITOT 0.9  --   --    < > = values in this interval not displayed.   ------------------------------------------------------------------------------------------------------------------  Cardiac Enzymes No results for input(s): TROPONINI in the last 168 hours. ------------------------------------------------------------------------------------------------------------------  RADIOLOGY:  Ct Bone Marrow Biopsy & Aspiration  Result Date: 04/01/2019 INDICATION: 73 year old male with pancytopenia EXAM: CT GUIDED BONE MARROW ASPIRATION AND CORE BIOPSY Interventional Radiologist:  Criselda Peaches, MD MEDICATIONS: None. ANESTHESIA/SEDATION: Moderate (conscious) sedation was employed during this procedure. A total of 2 milligrams versed and 75 micrograms fentanyl were administered intravenously. The patient's level  of consciousness and vital signs were monitored continuously by radiology nursing throughout the procedure under my direct supervision. Total monitored sedation time: 10 minutes FLUOROSCOPY TIME:  None COMPLICATIONS: None immediate. Estimated  blood loss: <25 mL PROCEDURE: Informed written consent was obtained from the patient after a thorough discussion of the procedural risks, benefits and alternatives. All questions were addressed. Maximal Sterile Barrier Technique was utilized including caps, mask, sterile gowns, sterile gloves, sterile drape, hand hygiene and skin antiseptic. A timeout was performed prior to the initiation of the procedure. The patient was positioned prone and non-contrast localization CT was performed of the pelvis to demonstrate the iliac marrow spaces. Maximal barrier sterile technique utilized including caps, mask, sterile gowns, sterile gloves, large sterile drape, hand hygiene, and betadine prep. Under sterile conditions and local anesthesia, an 11 gauge coaxial bone biopsy needle was advanced into the right iliac marrow space. Needle position was confirmed with CT imaging. Initially, bone marrow aspiration was performed. Next, the 11 gauge outer cannula was utilized to obtain a right iliac bone marrow core biopsy. Needle was removed. Hemostasis was obtained with compression. The patient tolerated the procedure well. Samples were prepared with the cytotechnologist. IMPRESSION: Technically successful CT-guided bone marrow aspiration and core biopsy of the right iliac bone. Electronically Signed   By: Jacqulynn Cadet M.D.   On: 04/01/2019 15:53     ASSESSMENT AND PLAN:   73 year old male with history of seizures and hypertension who presented to the emergency room due to fever and shortness of breath.  1.  Sepsis: Patient presents with fever and tachycardia.  Sepsis is of unknown etiology at this point.  Covid testing has been negative.  All work-up thus far has been negative.  Patient evaluated by ID. He is s/p bone marrow biopsy due to ongoing fever and pancytopenia.  We are awaiting results.  2.  Pancytopenia: Patient evaluated hematology and plan for bone marrow biopsy Patient status post 1 unit PRBC Patient  seen by Dr. Vicente Males with plans to consider EGD and colonoscopy once fever has resolved.  This can be done as inpatient or outpatient   3.  Seizure disorder: Due to ongoing fever and pancytopenia it has been recommended to discontinue phenobarbital which is being weaned by neurology.  He has been started on Vimpat.   4:Anxiety Continue Paxil and Xanax  5.  Essential hypertension: Continue Toprol  6.  Diabetes: Continue ADA diet with sliding scale and Lantus as per recommendations by diabetes nurse.  7.  BPH: Continue Flomax Management plans discussed with the patient and he is in agreement.  CODE STATUS: full  TOTAL TIME TAKING CARE OF THIS PATIENT: 27 minutes.     POSSIBLE D/C 2-4 days, DEPENDING ON CLINICAL CONDITION.   Bettey Costa M.D on 04/02/2019 at 10:58 AM  Between 7am to 6pm - Pager - [phone] After 6pm go to [email or web address] - password [email or web address] [facility]  Office  [phone]  CC: Primary care physician; Adin Hector, MD  Note: This dictation was prepared with Dragon dictation along with smaller phrase technology. Any transcriptional errors that result from this process are unintentional.

## 2019-04-03 LAB — GLUCOSE, CAPILLARY
Glucose-Capillary: 219 mg/dL — ABNORMAL HIGH (ref 70–99)
Glucose-Capillary: 208 mg/dL — ABNORMAL HIGH (ref 70–99)
Glucose-Capillary: 212 mg/dL — ABNORMAL HIGH (ref 70–99)
Glucose-Capillary: 325 mg/dL — ABNORMAL HIGH (ref 70–99)

## 2019-04-03 LAB — BASIC METABOLIC PANEL
Anion gap: 12 (ref 5–15)
BUN: 12 mg/dL (ref 8–23)
CO2: 27 mmol/L (ref 22–32)
Calcium: 8 mg/dL — ABNORMAL LOW (ref 8.9–10.3)
Chloride: 95 mmol/L — ABNORMAL LOW (ref 98–111)
Creatinine, Ser: 0.86 mg/dL (ref 0.61–1.24)
GFR calc Af Amer: >60 mL/min (ref >60)
GFR calc non Af Amer: >60 mL/min (ref >60)
Glucose, Bld: 229 mg/dL — ABNORMAL HIGH (ref 70–99)
Potassium: 3.4 mmol/L — ABNORMAL LOW (ref 3.5–5.1)
Sodium: 134 mmol/L — ABNORMAL LOW (ref 135–145)

## 2019-04-03 LAB — CBC
HCT: 30 % — ABNORMAL LOW (ref 39.0–52.0)
Hemoglobin: 9.7 g/dL — ABNORMAL LOW (ref 13.0–17.0)
MCH: 24.1 pg — ABNORMAL LOW (ref 26.0–34.0)
MCHC: 32.3 g/dL (ref 30.0–36.0)
MCV: 74.4 fL — ABNORMAL LOW (ref 80.0–100.0)
Platelets: 147 10*3/uL — ABNORMAL LOW (ref 150–400)
RBC: 4.03 MIL/uL — ABNORMAL LOW (ref 4.22–5.81)
RDW: 21.9 % — ABNORMAL HIGH (ref 11.5–15.5)
WBC: 4.4 10*3/uL (ref 4.0–10.5)
nRBC: 0.9 % — ABNORMAL HIGH (ref 0.0–0.2)

## 2019-04-03 LAB — SURGICAL PATHOLOGY

## 2019-04-03 MED ORDER — INSULIN GLARGINE 100 UNIT/ML ~~LOC~~ SOLN
8.0000 [IU] | Freq: Every day | SUBCUTANEOUS | Status: DC
  Filled 2019-04-03: qty 0.08

## 2019-04-03 MED ORDER — INSULIN GLARGINE 100 UNIT/ML ~~LOC~~ SOLN
3.0000 [IU] | Freq: Once | SUBCUTANEOUS | Status: AC
  Administered 2019-04-03: 3 [IU] via SUBCUTANEOUS
  Filled 2019-04-03: qty 0.03

## 2019-04-03 MED ORDER — KETOROLAC TROMETHAMINE 30 MG/ML IJ SOLN
7.5000 mg | Freq: Once | INTRAMUSCULAR | Status: DC
  Filled 2019-04-03: qty 1

## 2019-04-03 NOTE — Progress Notes (Signed)
Inpatient Diabetes Program Recommendations  AACE/ADA: New Consensus Statement on Inpatient Glycemic Control   Target Ranges:  Prepandial:   less than 140 mg/dL      Peak postprandial:   less than 180 mg/dL (1-2 hours)      Critically ill patients:  140 - 180 mg/dL   Results for Mike [name], Mike [name] (MRN [id number]) as of 04/03/2019 11:50  Ref. Range 04/02/2019 08:04 04/02/2019 12:09 04/02/2019 16:59 04/02/2019 20:50 04/03/2019 07:35  Glucose-Capillary Latest Ref Range: 70 - 99 mg/dL 171 (H) 200 (H) 200 (H) 287 (H) 219 (H)   Review of Glycemic Control Diabetes history:DM2 Outpatient Diabetes medications:Amaryl 4 mg QAM, Amaryl 2 mg QPM, Actos 30 mg daily Current orders for Inpatient glycemic control: Lantus 5 units daily, Novolog 0-9 units TID with meals, Novolog 0-5 units QHS  Inpatient Diabetes Program Recommendations:  Insulin-Basal: Please consider increasing Lantusto 8units daily.  Thanks, Barnie Alderman, RN, MSN, CDE Diabetes Coordinator Inpatient Diabetes Program [phone] (Team Pager from 8am to 5pm)

## 2019-04-03 NOTE — Progress Notes (Signed)
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 Pancytopenia could be due to above Plus phenobarbital- it is improving as phenobarbital dose is being reduced Fever could be from AML VS infection - but he did not respond to vanco and cefepime Proctodynia  VS sacral pain VS unclear pain in the scrotal area- ?? Need for anoscopy Observe off antibiotics
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
## 2019-04-03 NOTE — Progress Notes (Signed)
[facility] [place] NAME: Mike [name]    MR#:  [id number]  DATE OF BIRTH:  1945-07-10  SUBJECTIVE:  Patient had another fever of 101.5.  Patient has no complaints this morning.  Denies cough, abdominal pain, arm pain.  REVIEW OF SYSTEMS:    Review of Systems  Constitutional: ++ for fever, no chills weight loss HENT: Negative for ear pain, nosebleeds, congestion, facial swelling, rhinorrhea, neck pain, neck stiffness and ear discharge.   Respiratory: Negative for cough, shortness of breath, wheezing  Cardiovascular: Negative for chest pain, palpitations and leg swelling.  Gastrointestinal: Negative for heartburn, abdominal pain, vomiting, diarrhea or consitpation Genitourinary: Negative for dysuria, urgency, frequency, hematuria Musculoskeletal: Negative for back pain or joint pain Neurological: Negative for dizziness, seizures, syncope, focal weakness,  numbness and headaches.  Hematological: Does not bruise/bleed easily.  Psychiatric/Behavioral: Negative for hallucinations, confusion, dysphoric mood    Tolerating Diet: yes      DRUG ALLERGIES:   Allergies  Allergen Reactions  . Naproxen Sodium Other (See Comments)    Reaction: Upset stomach  . Nsaids Other (See Comments)    Other reaction(s): Unknown  . Aleve [Naproxen] Nausea And Vomiting  . Ibuprofen Nausea Only    Reaction: Upset stomach     VITALS:  Blood pressure 124/65, pulse 95, temperature 98.9 F (37.2 C), temperature source Oral, resp. rate 20, height '5\' 7"'  (1.702 m), weight 90 kg, SpO2 95 %.  PHYSICAL EXAMINATION:  Constitutional: Appears well-developed and well-nourished. No distress. HENT: Normocephalic. Marland Kitchen Oropharynx is clear and moist.  Eyes: Conjunctivae and EOM are normal. PERRLA, no scleral icterus.  Neck: Normal ROM. Neck supple. No JVD. No tracheal deviation. CVS: RRR, S1/S2 +, no murmurs, no gallops, no carotid bruit.  Pulmonary: Effort and breath  sounds normal, no stridor, rhonchi, wheezes, rales.  Abdominal: Soft. BS +,  no distension, tenderness, rebound or guarding.  Musculoskeletal: Normal range of motion. No edema and no tenderness. Left arm is swollen but he has good range of motion.  It appears that there was an IV that infiltrated in her arm per nursing. Neuro: Alert. CN 2-12 grossly intact. No focal deficits. Skin: Skin is warm and dry. No rash noted. Psychiatric: Normal mood and affect.      LABORATORY PANEL:   CBC Recent Labs  Lab 04/03/19 0403  WBC 4.4  HGB 9.7*  HCT 30.0*  PLT 147*   ------------------------------------------------------------------------------------------------------------------  Chemistries  Recent Labs  Lab 04/02/19 0648 04/03/19 0403  NA 136 134*  K 3.4* 3.4*  CL 99 95*  CO2 21* 27  GLUCOSE 173* 229*  BUN 13 12  CREATININE 0.87 0.86  CALCIUM 8.1* 8.0*  MG 1.8  --    ------------------------------------------------------------------------------------------------------------------  Cardiac Enzymes No results for input(s): TROPONINI in the last 168 hours. ------------------------------------------------------------------------------------------------------------------  RADIOLOGY:  Dg Abd Portable 1v  Result Date: 04/03/2019 CLINICAL DATA:  73 year old male with gastric distension. EXAM: PORTABLE ABDOMEN - 1 VIEW COMPARISON:  CT Abdomen and Pelvis 03/28/2019. FINDINGS: Supine view at 2345 hours. Mildly rotated to the right. Stable visualized osseous structures. Stable small surgical clips in the pelvis. Non obstructed bowel gas pattern. There is some retained oral contrast in nondilated large bowel including the sigmoid colon. No definite pneumoperitoneum on this supine view. IMPRESSION: Nonobstructed bowel-gas pattern with some retained oral contrast in the distal large bowel. Electronically Signed   By: Genevie Ann M.D.   On: 04/03/2019 00:03   Ct Bone Marrow Biopsy &  Aspiration  Result Date: 04/01/2019 INDICATION: 73 year old male with pancytopenia EXAM: CT GUIDED BONE MARROW ASPIRATION AND CORE BIOPSY Interventional Radiologist:  Criselda Peaches, MD MEDICATIONS: None. ANESTHESIA/SEDATION: Moderate (conscious) sedation was employed during this procedure. A total of 2 milligrams versed and 75 micrograms fentanyl were administered intravenously. The patient's level of consciousness and vital signs were monitored continuously by radiology nursing throughout the procedure under my direct supervision. Total monitored sedation time: 10 minutes FLUOROSCOPY TIME:  None COMPLICATIONS: None immediate. Estimated blood loss: <25 mL PROCEDURE: Informed written consent was obtained from the patient after a thorough discussion of the procedural risks, benefits and alternatives. All questions were addressed. Maximal Sterile Barrier Technique was utilized including caps, mask, sterile gowns, sterile gloves, sterile drape, hand hygiene and skin antiseptic. A timeout was performed prior to the initiation of the procedure. The patient was positioned prone and non-contrast localization CT was performed of the pelvis to demonstrate the iliac marrow spaces. Maximal barrier sterile technique utilized including caps, mask, sterile gowns, sterile gloves, large sterile drape, hand hygiene, and betadine prep. Under sterile conditions and local anesthesia, an 11 gauge coaxial bone biopsy needle was advanced into the right iliac marrow space. Needle position was confirmed with CT imaging. Initially, bone marrow aspiration was performed. Next, the 11 gauge outer cannula was utilized to obtain a right iliac bone marrow core biopsy. Needle was removed. Hemostasis was obtained with compression. The patient tolerated the procedure well. Samples were prepared with the cytotechnologist. IMPRESSION: Technically successful CT-guided bone marrow aspiration and core biopsy of the right iliac bone. Electronically  Signed   By: Jacqulynn Cadet M.D.   On: 04/01/2019 15:53     ASSESSMENT AND PLAN:   73 year old male with history of seizures and hypertension who presented to the emergency room due to fever and shortness of breath.  1.  Sepsis: Patient presented with fever and tachycardia.  Sepsis is of unknown etiology at this point.  Covid testing has been negative.  All work-up thus far has been negative.  Patient evaluated by ID. He is s/p bone marrow biopsy due to ongoing fever and pancytopenia.  We are awaiting results of biopsy.  2.  Pancytopenia: Patient evaluated hematology and plan for bone marrow biopsy Patient status post 1 unit PRBC Patient seen by Dr. Vicente Males with plans to consider EGD and colonoscopy once fever has resolved.  This can be done as inpatient or outpatient   3.  Seizure disorder: Due to ongoing fever and pancytopenia it has been recommended to discontinue phenobarbital which is being weaned by neurology.  He has been started on Vimpat.   4:Anxiety: Continue Paxil and Xanax  5.  Essential hypertension: Continue Toprol  6.  Diabetes: Continue ADA diet with sliding scale and Lantus as per recommendations by diabetes nurse.  7.  BPH: Continue Flomax   Management plans discussed with the patient and he is in agreement.  CODE STATUS: full  TOTAL TIME TAKING CARE OF THIS PATIENT: 24 minutes.     POSSIBLE D/C 2-4 days, DEPENDING ON CLINICAL CONDITION.   Bettey Costa M.D on 04/03/2019 at 10:02 AM  Between 7am to 6pm - Pager - [phone] After 6pm go to [email or web address] - password [email or web address] [facility]  Office  [phone]  CC: Primary care physician; Adin Hector, MD  Note: This dictation was prepared with Dragon dictation along with smaller phrase technology. Any transcriptional errors that result from this process are unintentional.

## 2019-04-03 NOTE — TOC Progression Note (Signed)
Transition of Care [facility]) - Progression Note    Patient Details  Name: Mike [name] MRN: [id number] Date of Birth: 01-22-1946  Transition of Care [facility]) CM/SW Contact  Shelbie Hutching, RN Phone Number: 04/03/2019, 9:36 AM  Clinical Narrative:     Patient is not medically ready for discharge he continues to be febrile overnight.  Plan for discharge is still [facility].  RNCM will cont to follow.   Expected Discharge Plan: [place] Barriers to Discharge: Continued Medical Work up  Expected Discharge Plan and Services Expected Discharge Plan: [place]   Discharge Planning Services: CM Consult Post Acute Care Choice: [place] Living arrangements for the past 2 months: Single Family Home                                       Social Determinants of Health (SDOH) Interventions    Readmission Risk Interventions No flowsheet data found.

## 2019-04-03 NOTE — Care Management Important Message (Signed)
Important Message  Patient Details  Name: Mike [name] MRN: [id number] Date of Birth: [birth year]   Medicare Important Message Given:  Yes     Dannette Barbara 04/03/2019, 12:45 PM

## 2019-04-03 NOTE — Progress Notes (Signed)
Mike Antigua, MD [address], [place], [place], [place], [address] [address], [place], [place], [place], [address] Phone: [phone]  Fax: [phone]   Subjective: Patient resting in bed comfortably.  Continues to have intermittent fevers.  No active GI bleeding.   Objective: Exam: Vital signs in last 24 hours: Vitals:   04/02/19 2048 04/02/19 2323 04/03/19 0120 04/03/19 0526  BP: (!) 142/67   124/65  Pulse: (!) 125 (!) 108  95  Resp: 20   20  Temp: (!) 100.9 F (38.3 C) (!) 101.5 F (38.6 C) 99.8 F (37.7 C) 98.9 F (37.2 C)  TempSrc: Oral Oral Oral Oral  SpO2: 95% 95%  95%  Weight:      Height:       Weight change:   Intake/Output Summary (Last 24 hours) at 04/03/2019 1641 Last data filed at 04/03/2019 1216 Gross per 24 hour  Intake 0 ml  Output 3150 ml  Net -3150 ml    General: No acute distress, AAO x3 Abd: Soft, NT/ND, No HSM Skin: Warm, no rashes Neck: Supple, Trachea midline   Lab Results: Lab Results  Component Value Date   WBC 4.4 04/03/2019   HGB 9.7 (L) 04/03/2019   HCT 30.0 (L) 04/03/2019   MCV 74.4 (L) 04/03/2019   PLT 147 (L) 04/03/2019   Micro Results: Recent Results (from the past 240 hour(s))  Culture, blood (Routine x 2)     Status: None   Collection Time: 03/26/19  8:12 PM   Specimen: BLOOD  Result Value Ref Range Status   Specimen Description BLOOD LEFT ANTECUBITAL  Final   Special Requests   Final    BOTTLES DRAWN AEROBIC AND ANAEROBIC Blood Culture adequate volume   Culture   Final    NO GROWTH 5 DAYS Performed at [facility], [address] [place], [place] [address]    Report Status 03/31/2019 FINAL  Final  Culture, blood (Routine x 2)     Status: None   Collection Time: 03/26/19  8:50 PM   Specimen: BLOOD  Result Value Ref Range Status   Specimen Description BLOOD BLOOD RIGHT HAND  Final   Special Requests   Final    BOTTLES DRAWN AEROBIC AND ANAEROBIC Blood Culture adequate volume   Culture   Final    NO GROWTH 5 DAYS Performed at [facility], [address], [place], [place] [address]    Report Status 03/31/2019 FINAL  Final  SARS Coronavirus 2 [facility] order, Performed in [facility] hospital lab) Nasopharyngeal Nasopharyngeal Swab     Status: None   Collection Time: 03/26/19  8:50 PM   Specimen: Nasopharyngeal Swab  Result Value Ref Range Status   SARS Coronavirus 2 NEGATIVE NEGATIVE Final    Comment: (NOTE) If result is NEGATIVE SARS-CoV-2 target nucleic acids are NOT DETECTED. The SARS-CoV-2 RNA is generally detectable in upper and lower  respiratory specimens during the acute phase of infection. The lowest  concentration of SARS-CoV-2 viral copies this assay can detect is 250  copies / mL. A negative result does not preclude SARS-CoV-2 infection  and should not be used as the sole basis for treatment or other  patient management decisions.  A negative result may occur with  improper specimen collection / handling, submission of specimen other  than nasopharyngeal swab, presence of viral mutation(s) within the  areas targeted by this assay, and inadequate number of viral copies  (<250 copies / mL). A negative result must be  combined with clinical  observations, patient history, and epidemiological information. If result is POSITIVE SARS-CoV-2 target nucleic acids are DETECTED. The SARS-CoV-2 RNA is generally detectable in upper and lower  respiratory specimens dur ing the acute phase of infection.  Positive  results are indicative of active infection with SARS-CoV-2.  Clinical  correlation with patient history and other diagnostic information is  necessary to determine patient infection status.  Positive results do  not rule out bacterial infection or co-infection with other viruses. If result is PRESUMPTIVE POSTIVE SARS-CoV-2 nucleic acids MAY BE PRESENT.   A presumptive positive result was obtained on the submitted specimen  and confirmed  on repeat testing.  While 2019 novel coronavirus  (SARS-CoV-2) nucleic acids may be present in the submitted sample  additional confirmatory testing may be necessary for epidemiological  and / or clinical management purposes  to differentiate between  SARS-CoV-2 and other Sarbecovirus currently known to infect humans.  If clinically indicated additional testing with an alternate test  methodology [phone]) is advised. The SARS-CoV-2 RNA is generally  detectable in upper and lower respiratory sp ecimens during the acute  phase of infection. The expected result is Negative. Fact Sheet for Patients:  StrictlyIdeas.no Fact Sheet for Healthcare Providers: BankingDealers.co.za This test is not yet approved or cleared by the [place] FDA and has been authorized for detection and/or diagnosis of SARS-CoV-2 by FDA under an Emergency Use Authorization (EUA).  This EUA will remain in effect (meaning this test can be used) for the duration of the COVID-19 declaration under Section 564(b)(1) of the Act, 21 U.S.C. section 360bbb-3(b)(1), unless the authorization is terminated or revoked sooner. Performed at [facility], [address], [place], [place] [address]   Urine culture     Status: None   Collection Time: 03/26/19  8:51 PM   Specimen: In/Out Cath Urine  Result Value Ref Range Status   Specimen Description   Final    IN/OUT CATH URINE Performed at [facility], [address], [place], [place] [address]    Special Requests   Final    NONE Performed at [facility], [address], [place], [place] [address]    Culture   Final    NO GROWTH Performed at [facility], [place] [address] [place],  [address]    Report Status 03/27/2019 FINAL  Final  MRSA PCR Screening     Status: None   Collection Time: 03/27/19 10:00 AM   Specimen: Nasal Mucosa; Nasopharyngeal  Result Value Ref Range Status    MRSA by PCR NEGATIVE NEGATIVE Final    Comment:        The GeneXpert MRSA Assay (FDA approved for NASAL specimens only), is one component of a comprehensive MRSA colonization surveillance program. It is not intended to diagnose MRSA infection nor to guide or monitor treatment for MRSA infections. Performed at [facility], [address] [place],  [address]   CULTURE, BLOOD (ROUTINE X 2) w Reflex to ID Panel     Status: None (Preliminary result)   Collection Time: 03/30/19  9:13 AM   Specimen: BLOOD  Result Value Ref Range Status   Specimen Description BLOOD R HAND  Final   Special Requests   Final    BOTTLES DRAWN AEROBIC AND ANAEROBIC Blood Culture adequate volume   Culture   Final    NO GROWTH 4 DAYS Performed at [facility] [address] [place],  [address]  Report Status PENDING  Incomplete  CULTURE, BLOOD (ROUTINE X 2) w Reflex to ID Panel     Status: None (Preliminary result)   Collection Time: 03/30/19  9:21 AM   Specimen: BLOOD  Result Value Ref Range Status   Specimen Description BLOOD R AC  Final   Special Requests   Final    BOTTLES DRAWN AEROBIC AND ANAEROBIC Blood Culture adequate volume   Culture   Final    NO GROWTH 4 DAYS Performed at [facility], [address], [place], [place] [address]    Report Status PENDING  Incomplete  Urine Culture     Status: None   Collection Time: 03/30/19  9:21 AM   Specimen: Urine, Catheterized  Result Value Ref Range Status   Specimen Description   Final    URINE, CATHETERIZED Performed at [facility], [address], [place], [place] [address]    Special Requests   Final    NONE Performed at [facility], [address], [place], [place] [address]    Culture   Final    NO GROWTH Performed at [facility], [place] [address] [place], [place] [address]    Report Status 03/31/2019 FINAL  Final   Studies/Results: Dg Abd Portable 1v  Result  Date: 04/03/2019 CLINICAL DATA:  73 year old male with gastric distension. EXAM: PORTABLE ABDOMEN - 1 VIEW COMPARISON:  CT Abdomen and Pelvis 03/28/2019. FINDINGS: Supine view at 2345 hours. Mildly rotated to the right. Stable visualized osseous structures. Stable small surgical clips in the pelvis. Non obstructed bowel gas pattern. There is some retained oral contrast in nondilated large bowel including the sigmoid colon. No definite pneumoperitoneum on this supine view. IMPRESSION: Nonobstructed bowel-gas pattern with some retained oral contrast in the distal large bowel. Electronically Signed   By: Genevie Ann M.D.   On: 04/03/2019 00:03   Medications:  Scheduled Meds: . ALPRAZolam  0.5-1 mg Oral BID  . aspirin EC  81 mg Oral Daily  . atorvastatin  20 mg Oral Daily  . Chlorhexidine Gluconate Cloth  6 each Topical Daily  . cholecalciferol  1,000 Units Oral Daily  . feeding supplement (NEPRO CARB STEADY)  237 mL Oral BID BM  . furosemide  40 mg Oral Daily  . gabapentin  1,200 mg Oral QHS  . gabapentin  300 mg Oral Daily  . insulin aspart  0-5 Units Subcutaneous QHS  . insulin aspart  0-9 Units Subcutaneous TID WC  . insulin glargine  3 Units Subcutaneous Once  . [START ON 04/04/2019] insulin glargine  8 Units Subcutaneous Daily  . lacosamide  100 mg Oral BID  . metoprolol succinate  50 mg Oral BID  . multivitamin with minerals  1 tablet Oral Daily  . omega-3 acid ethyl esters  1,000 mg Oral Daily  . pantoprazole  40 mg Oral Daily  . PARoxetine  20 mg Oral Daily  . sodium chloride flush  10-40 mL Intracatheter Q12H  . tamsulosin  0.4 mg Oral Daily  . vitamin B-12  2,000 mcg Oral Daily   Continuous Infusions: PRN Meds:.acetaminophen **OR** acetaminophen, acetaminophen-codeine, docusate sodium, ipratropium-albuterol, LORazepam, morphine injection, nitroGLYCERIN, opium-belladonna, polyethylene glycol, sodium chloride flush   Assessment: Anemia  Plan: Patient's anemia has now improved  with hemoglobin around 9.5  In the absence of any active GI bleeding and ongoing fevers with unknown source, and pending work-up for the same with bone marrow biopsy pending, endoscopic procedures are high risk in the setting  of ongoing fevers and, and unlikely to be of any therapeutic benefit without any evidence of active GI bleeding  Continue work-up as per primary team and oncology  Endoscopic work-up would be best done when patient is medically optimized and acute issues have resolved.  This can be done as an inpatient or outpatient depending on clinical condition  Since we are unable to do any endoscopic work-up at this time, GI service will sign off.  Please feel free to page [place] with any questions or concerns, changes in clinical status or if we can be of any help if clinical conditions improves.  Otherwise, patient can follow-up closely in GI clinic within few weeks of discharge   LOS: 7 days   Mike Antigua, MD 04/03/2019, 4:41 PM

## 2019-04-04 ENCOUNTER — Inpatient Hospital Stay: Payer: Medicare Other | Admitting: Certified Registered"

## 2019-04-04 ENCOUNTER — Encounter
Admission: EM | Disposition: A | Discharge status: Skilled Nursing Facility | Payer: Self-pay | Source: Home / Self Care | Attending: Internal Medicine

## 2019-04-04 DIAGNOSIS — C9200 Acute myeloblastic leukemia, not having achieved remission: Secondary | ICD-10-CM

## 2019-04-04 DIAGNOSIS — Z79899 Other long term (current) drug therapy: Secondary | ICD-10-CM

## 2019-04-04 DIAGNOSIS — R5081 Fever presenting with conditions classified elsewhere: Secondary | ICD-10-CM

## 2019-04-04 DIAGNOSIS — D709 Neutropenia, unspecified: Secondary | ICD-10-CM

## 2019-04-04 DIAGNOSIS — R5383 Other fatigue: Secondary | ICD-10-CM

## 2019-04-04 DIAGNOSIS — R410 Disorientation, unspecified: Secondary | ICD-10-CM

## 2019-04-04 HISTORY — PX: FLEXIBLE SIGMOIDOSCOPY: SHX5431

## 2019-04-04 LAB — CERULOPLASMIN: Ceruloplasmin: 43.4 mg/dL — ABNORMAL HIGH (ref 16.0–31.0)

## 2019-04-04 LAB — CBC WITH DIFFERENTIAL/PLATELET
Abs Immature Granulocytes: 0.54 10*3/uL — ABNORMAL HIGH (ref 0.00–0.07)
Basophils Absolute: 0.1 10*3/uL (ref 0.0–0.1)
Basophils Relative: 1 %
Eosinophils Absolute: 0 10*3/uL (ref 0.0–0.5)
Eosinophils Relative: 0 %
HCT: 30.7 % — ABNORMAL LOW (ref 39.0–52.0)
Hemoglobin: 10 g/dL — ABNORMAL LOW (ref 13.0–17.0)
Immature Granulocytes: 10 %
Lymphocytes Relative: 26 %
Lymphs Abs: 1.4 10*3/uL (ref 0.7–4.0)
MCH: 24.2 pg — ABNORMAL LOW (ref 26.0–34.0)
MCHC: 32.6 g/dL (ref 30.0–36.0)
MCV: 74.3 fL — ABNORMAL LOW (ref 80.0–100.0)
Monocytes Absolute: 0.6 10*3/uL (ref 0.1–1.0)
Monocytes Relative: 12 %
Neutro Abs: 2.8 10*3/uL (ref 1.7–7.7)
Neutrophils Relative %: 51 %
Platelets: 170 10*3/uL (ref 150–400)
RBC: 4.13 MIL/uL — ABNORMAL LOW (ref 4.22–5.81)
RDW: 22.2 % — ABNORMAL HIGH (ref 11.5–15.5)
Smear Review: NORMAL
WBC: 5.4 10*3/uL (ref 4.0–10.5)
nRBC: 0.9 % — ABNORMAL HIGH (ref 0.0–0.2)

## 2019-04-04 LAB — BASIC METABOLIC PANEL
Anion gap: 14 (ref 5–15)
BUN: 15 mg/dL (ref 8–23)
CO2: 30 mmol/L (ref 22–32)
Calcium: 8.2 mg/dL — ABNORMAL LOW (ref 8.9–10.3)
Chloride: 90 mmol/L — ABNORMAL LOW (ref 98–111)
Creatinine, Ser: 0.98 mg/dL (ref 0.61–1.24)
GFR calc Af Amer: >60 mL/min (ref >60)
GFR calc non Af Amer: >60 mL/min (ref >60)
Glucose, Bld: 297 mg/dL — ABNORMAL HIGH (ref 70–99)
Potassium: 3.4 mmol/L — ABNORMAL LOW (ref 3.5–5.1)
Sodium: 134 mmol/L — ABNORMAL LOW (ref 135–145)

## 2019-04-04 LAB — GLUCOSE, CAPILLARY
Glucose-Capillary: 267 mg/dL — ABNORMAL HIGH (ref 70–99)
Glucose-Capillary: 268 mg/dL — ABNORMAL HIGH (ref 70–99)
Glucose-Capillary: 249 mg/dL — ABNORMAL HIGH (ref 70–99)
Glucose-Capillary: 284 mg/dL — ABNORMAL HIGH (ref 70–99)

## 2019-04-04 LAB — PROTIME-INR
INR: 1.4 — ABNORMAL HIGH (ref 0.8–1.2)
Prothrombin Time: 16.5 seconds — ABNORMAL HIGH (ref 11.4–15.2)

## 2019-04-04 LAB — CULTURE, BLOOD (ROUTINE X 2)
Culture: NO GROWTH
Culture: NO GROWTH
Special Requests: ADEQUATE
Special Requests: ADEQUATE

## 2019-04-04 LAB — APTT: aPTT: 36 seconds (ref 24–36)

## 2019-04-04 LAB — FIBRINOGEN: Fibrinogen: >750 mg/dL — ABNORMAL HIGH (ref 210–475)

## 2019-04-04 SURGERY — SIGMOIDOSCOPY, FLEXIBLE
Anesthesia: General

## 2019-04-04 MED ORDER — SODIUM CHLORIDE 0.9 % IV SOLN
1.5000 g | Freq: Four times a day (QID) | INTRAVENOUS | Status: DC
  Administered 2019-04-04 – 2019-04-08 (×16): 1.5 g via INTRAVENOUS
  Filled 2019-04-04 (×6): qty 4
  Filled 2019-04-04 (×2): qty 1.5
  Filled 2019-04-04 (×6): qty 4
  Filled 2019-04-04 (×2): qty 1.5
  Filled 2019-04-04 (×2): qty 4
  Filled 2019-04-04: qty 1.5
  Filled 2019-04-04 (×2): qty 4
  Filled 2019-04-04: qty 1.5
  Filled 2019-04-04: qty 4
  Filled 2019-04-04: qty 1.5
  Filled 2019-04-04: qty 4
  Filled 2019-04-04: qty 1.5

## 2019-04-04 MED ORDER — LIDOCAINE HCL (CARDIAC) PF 100 MG/5ML IV SOSY
PREFILLED_SYRINGE | INTRAVENOUS | Status: DC | PRN
  Administered 2019-04-04: 100 mg via INTRATRACHEAL

## 2019-04-04 MED ORDER — PROPOFOL 500 MG/50ML IV EMUL
INTRAVENOUS | Status: DC | PRN
  Administered 2019-04-04: 25 ug/kg/min via INTRAVENOUS

## 2019-04-04 MED ORDER — KETOROLAC TROMETHAMINE 15 MG/ML IJ SOLN
7.5000 mg | Freq: Once | INTRAMUSCULAR | Status: DC
  Filled 2019-04-04: qty 1

## 2019-04-04 MED ORDER — INSULIN ASPART 100 UNIT/ML ~~LOC~~ SOLN
0.0000 [IU] | Freq: Three times a day (TID) | SUBCUTANEOUS | Status: DC
  Administered 2019-04-04: 18:00:00 5 [IU] via SUBCUTANEOUS
  Administered 2019-04-05: 15 [IU] via SUBCUTANEOUS
  Administered 2019-04-05: 8 [IU] via SUBCUTANEOUS
  Administered 2019-04-05 – 2019-04-06 (×2): 15 [IU] via SUBCUTANEOUS
  Administered 2019-04-06 – 2019-04-07 (×3): 11 [IU] via SUBCUTANEOUS
  Administered 2019-04-07: 15 [IU] via SUBCUTANEOUS
  Administered 2019-04-07 – 2019-04-08 (×2): 5 [IU] via SUBCUTANEOUS
  Administered 2019-04-08: 3 [IU] via SUBCUTANEOUS
  Administered 2019-04-08: 13:00:00 5 [IU] via SUBCUTANEOUS
  Administered 2019-04-09: 08:00:00 3 [IU] via SUBCUTANEOUS
  Administered 2019-04-09: 5 [IU] via SUBCUTANEOUS
  Filled 2019-04-04 (×15): qty 1

## 2019-04-04 MED ORDER — PROPOFOL 10 MG/ML IV BOLUS
INTRAVENOUS | Status: DC | PRN
  Administered 2019-04-04 (×2): 20 mg via INTRAVENOUS

## 2019-04-04 MED ORDER — FLEET ENEMA 7-19 GM/118ML RE ENEM
2.0000 | ENEMA | Freq: Once | RECTAL | Status: AC
  Administered 2019-04-04: 2 via RECTAL

## 2019-04-04 MED ORDER — SODIUM CHLORIDE 0.9 % IV SOLN
INTRAVENOUS | Status: DC
  Administered 2019-04-04: 1000 mL via INTRAVENOUS
  Administered 2019-04-04: 13:00:00 via INTRAVENOUS

## 2019-04-04 MED ORDER — PHENYLEPHRINE HCL (PRESSORS) 10 MG/ML IV SOLN
INTRAVENOUS | Status: DC | PRN
  Administered 2019-04-04: 100 ug via INTRAVENOUS

## 2019-04-04 MED ORDER — INSULIN GLARGINE 100 UNIT/ML ~~LOC~~ SOLN
10.0000 [IU] | Freq: Every day | SUBCUTANEOUS | Status: DC
  Administered 2019-04-05 – 2019-04-07 (×3): 10 [IU] via SUBCUTANEOUS
  Filled 2019-04-04 (×3): qty 0.1

## 2019-04-04 NOTE — Progress Notes (Signed)
[facility] NAME: Mike [name]    MR#:  [id number]  DATE OF BIRTH:  09-05-1945  SUBJECTIVE:  Patient just hollering out. Had fever   REVIEW OF SYSTEMS:    Unable to obtain patient just on phone hollering out  Tolerating Diet: yes      DRUG ALLERGIES:   Allergies  Allergen Reactions  . Naproxen Sodium Other (See Comments)    Reaction: Upset stomach  . Nsaids Other (See Comments)    Other reaction(s): Unknown  . Aleve [Naproxen] Nausea And Vomiting  . Ibuprofen Nausea Only    Reaction: Upset stomach     VITALS:  Blood pressure (!) 150/93, pulse (!) 112, temperature 98.1 F (36.7 C), resp. rate 18, height 5\' 7"  (1.702 m), weight 90 kg, SpO2 98 %.  PHYSICAL EXAMINATION:  Constitutional: Appears well-developed and well-nourished. No distress. HENT: Normocephalic. Marland Kitchen Oropharynx is clear and moist.  Eyes: Conjunctivae and EOM are normal. PERRLA, no scleral icterus.  Neck: Normal ROM. Neck supple. No JVD. No tracheal deviation. CVS: RRR, S1/S2 +, no murmurs, no gallops, no carotid bruit.  Pulmonary: Effort and breath sounds normal, no stridor, rhonchi, wheezes, rales.  Abdominal: Soft. BS +,  no distension, tenderness, rebound or guarding.  Musculoskeletal: Normal range of motion. No edema and no tenderness. Left arm is swollen but he has good range of motion.  It appears that there was an IV that infiltrated in her arm per nursing. Neuro: Alert. CN 2-12 grossly intact. No focal deficits. Skin: Skin is warm and dry. No rash noted. Psychiatric: Normal mood and affect.      LABORATORY PANEL:   CBC Recent Labs  Lab 04/04/19 0941  WBC 5.4  HGB 10.0*  HCT 30.7*  PLT 170   ------------------------------------------------------------------------------------------------------------------  Chemistries  Recent Labs  Lab 04/02/19 0648  04/04/19 0941  NA 136   < > 134*  K 3.4*   < > 3.4*  CL 99   < > 90*  CO2 21*    < > 30  GLUCOSE 173*   < > 297*  BUN 13   < > 15  CREATININE 0.87   < > 0.98  CALCIUM 8.1*   < > 8.2*  MG 1.8  --   --    < > = values in this interval not displayed.   ------------------------------------------------------------------------------------------------------------------  Cardiac Enzymes No results for input(s): TROPONINI in the last 168 hours. ------------------------------------------------------------------------------------------------------------------  RADIOLOGY:  Dg Abd Portable 1v  Result Date: 04/03/2019 CLINICAL DATA:  73 year old male with gastric distension. EXAM: PORTABLE ABDOMEN - 1 VIEW COMPARISON:  CT Abdomen and Pelvis 03/28/2019. FINDINGS: Supine view at 2345 hours. Mildly rotated to the right. Stable visualized osseous structures. Stable small surgical clips in the pelvis. Non obstructed bowel gas pattern. There is some retained oral contrast in nondilated large bowel including the sigmoid colon. No definite pneumoperitoneum on this supine view. IMPRESSION: Nonobstructed bowel-gas pattern with some retained oral contrast in the distal large bowel. Electronically Signed   By: Genevie Ann M.D.   On: 04/03/2019 00:03     ASSESSMENT AND PLAN:   73 year old male with history of seizures and hypertension who presented to the emergency room due to fever and shortness of breath.  1.  Fever: Initially patient felt to have sepsis due to fever and tachycardia.  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  Sepsis has been ruled out.   Oncology to follow and  speak with sister today.     2.  Pancytopenia: This actually is improving.  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   3.  Seizure disorder: Due to ongoing fever and pancytopenia it has been recommended to discontinue phenobarbital which is weaned off. Continue Vimpat  4:Anxiety: Continue Paxil and Xanax  5.  Essential hypertension: Continue Toprol  6.  Diabetes: Continue ADA diet with sliding  scale and Lantus as per recommendations by diabetes nurse.  7.  BPH: Continue Flomax   Management plans discussed with the nursing  CODE STATUS: full  TOTAL TIME TAKING CARE OF THIS PATIENT: 24 minutes.     POSSIBLE D/C 2-4 days, DEPENDING ON CLINICAL CONDITION.   Bettey Costa M.D on 04/04/2019 at 10:28 AM  Between 7am to 6pm - Pager - [phone] After 6pm go to [email or web address] - password [email or web address] [facility]  Office  [phone]  CC: Primary care physician; Adin Hector, MD  Note: This dictation was prepared with Dragon dictation along with smaller phrase technology. Any transcriptional errors that result from this process are unintentional.

## 2019-04-04 NOTE — Anesthesia Postprocedure Evaluation (Signed)
Anesthesia Post Note  Patient: Mike [name]  Procedure(s) Performed: FLEXIBLE SIGMOIDOSCOPY (N/A )  Patient location during evaluation: PACU Anesthesia Type: General Level of consciousness: awake and alert Pain management: pain level controlled Vital Signs Assessment: post-procedure vital signs reviewed and stable Respiratory status: spontaneous breathing and respiratory function stable Cardiovascular status: stable Anesthetic complications: no     Last Vitals:  Vitals:   04/04/19 1350 04/04/19 1410  BP: 116/62 (!) 132/55  Pulse:    Resp:    Temp:    SpO2:      Last Pain:  Vitals:   04/04/19 1410  TempSrc:   PainSc: 0-No pain                 KEPHART,WILLIAM K

## 2019-04-04 NOTE — Transfer of Care (Signed)
Immediate Anesthesia Transfer of Care Note  Patient: Mike [name]  Procedure(s) Performed: FLEXIBLE SIGMOIDOSCOPY (N/A )  Patient Location: Endoscopy Unit  Anesthesia Type:General  Level of Consciousness: drowsy, patient cooperative and responds to stimulation  Airway & Oxygen Therapy: Patient Spontanous Breathing and Patient connected to face mask oxygen  Post-op Assessment: Report given to RN and Post -op Vital signs reviewed and stable  Post vital signs: Reviewed and stable  Last Vitals:  Vitals Value Taken Time  BP 98/62 04/04/19 1341  Temp    Pulse 118 04/04/19 1341  Resp 22 04/04/19 1341  SpO2 100 % 04/04/19 1341  Vitals shown include unvalidated device data.  Last Pain:  Vitals:   04/04/19 1208  TempSrc: Tympanic  PainSc:          Complications: No apparent anesthesia complications

## 2019-04-04 NOTE — Anesthesia Post-op Follow-up Note (Signed)
Anesthesia QCDR form completed.        

## 2019-04-04 NOTE — Anesthesia Preprocedure Evaluation (Signed)
Anesthesia Evaluation  Patient identified by MRN, date of birth, ID band Patient confused    Reviewed: Allergy & Precautions, NPO status , Patient's Chart, lab work & pertinent test results, Unable to perform ROS - Chart review only  Airway Mallampati: III       Dental   Pulmonary           Cardiovascular hypertension, Pt. on medications (-) Past MI and (-) CHF (-) dysrhythmias (-) Valvular Problems/Murmurs     Neuro/Psych Seizures -,  Anxiety Dementia    GI/Hepatic Neg liver ROS, neg GERD  ,  Endo/Other  diabetes, Type 2, Oral Hypoglycemic Agents  Renal/GU negative Renal ROS     Musculoskeletal   Abdominal   Peds  Hematology   Anesthesia Other Findings   Reproductive/Obstetrics                             Anesthesia Physical Anesthesia Plan  ASA: III  Anesthesia Plan: General   Post-op Pain Management:    Induction: Intravenous  PONV Risk Score and Plan: 2 and TIVA and Propofol infusion  Airway Management Planned: Nasal Cannula  Additional Equipment:   Intra-op Plan:   Post-operative Plan:   Informed Consent: I have reviewed the patients History and Physical, chart, labs and discussed the procedure including the risks, benefits and alternatives for the proposed anesthesia with the patient or authorized representative who has indicated his/her understanding and acceptance.       Plan Discussed with:   Anesthesia Plan Comments:         Anesthesia Quick Evaluation

## 2019-04-04 NOTE — Op Note (Signed)
[facility] Gastroenterology Patient Name: Mike [name] Procedure Date: 04/04/2019 1:11 PM MRN: [id number] Account #: [id number] Date of Birth: [birth year] Admit Type: Inpatient Age: 73 Room: [facility] ENDO ROOM 3 Gender: Male Note Status: Finalized Procedure:            Flexible Sigmoidoscopy Indications:          Rectal pain Providers:            Malvina Schadler B. Bonna Gains MD, MD Medicines:            General Anesthesia Complications:        No immediate complications. Procedure:            Pre-Anesthesia Assessment:                       - Prior to the procedure, a History and Physical was                        performed, and patient medications and allergies were                        reviewed. The patient is competent. The risks and                        benefits of the procedure and the sedation options and                        risks were discussed with the patient. All questions                        were answered and informed consent was obtained.                        Patient identification and proposed procedure were                        verified by the physician, the nurse, the                        anesthesiologist, the anesthetist and the technician in                        the pre-procedure area in the procedure room in the                        endoscopy suite. Mental Status Examination: alert and                        oriented. Airway Examination: normal oropharyngeal                        airway and neck mobility. Respiratory Examination:                        clear to auscultation. CV Examination: normal.                        Prophylactic Antibiotics: The patient does not require                        prophylactic antibiotics. Prior Anticoagulants:  The                        patient has taken no previous anticoagulant or                        antiplatelet agents. ASA Grade Assessment: II - A                        patient with mild systemic  disease. After reviewing the                        risks and benefits, the patient was deemed in                        satisfactory condition to undergo the procedure. The                        anesthesia plan was to use general anesthesia.                        Immediately prior to administration of medications, the                        patient was re-assessed for adequacy to receive                        sedatives. The heart rate, respiratory rate, oxygen                        saturations, blood pressure, adequacy of pulmonary                        ventilation, and response to care were monitored                        throughout the procedure. The physical status of the                        patient was re-assessed after the procedure.                       After obtaining informed consent, the scope was passed                        under direct vision. The Colonoscope was introduced                        through the anus and advanced to the the descending                        colon. The flexible sigmoidoscopy was accomplished with                        ease. The quality of the bowel preparation was adequate. Findings:      The perianal exam findings include erythematous perianal area, and a       superficial ulcer on the right buttock.      The digital rectal exam was normal.      Stool was found in the entire colon. Cleaned  with water and suctioning.      The exam was otherwise without abnormality.      No ulceration or abnormalities present in the rectum. Impression:           - Erythematous perianal area, and a superficial ulcer                        on the right buttock. found on perianal exam.                       - Stool in the entire examined colon.                       - The examination was otherwise normal.                       - No ulceration or abnormalities present in the rectum.                       - No specimens collected.                       -  The source of patient's rectal pain is likely his                        perianal ulceration and perianal erythema.                       - No evidence of active or recent bleeding seen                        throughout the exam. Recommendation:       - Clean perianal area with soap and water daily and                        keep it dry.                       Primary team to consider wound care consult if needed.                       - Follow up in GI clinic in 3-4 weeks of discharge and                        assess need for endoscopic procedures at an outpatient                        when acute medical issues resolve. No indication for                        further endoscopic procedures at this time in absence                        of GI bleeding, yellow stool on today's exam, and                        improved and stable Hgb.                       - The findings and recommendations were discussed with  the patient.                       - Advance diet as tolerated. Procedure Code(s):    --- Professional ---                       [id number], Sigmoidoscopy, flexible; diagnostic, including                        collection of specimen(s) by brushing or washing, when                        performed (separate procedure) Diagnosis Code(s):    --- Professional ---                       K62.89, Other specified diseases of anus and rectum CPT copyright 2019 [employer]. All rights reserved. The codes documented in this report are preliminary and upon coder review may  be revised to meet current compliance requirements.  Vonda Antigua, MD Margretta Sidle B. Bonna Gains MD, MD 04/04/2019 1:55:15 PM This report has been signed electronically. Number of Addenda: 0 Note Initiated On: 04/04/2019 1:11 PM Scope Withdrawal Time: 0 hours 4 minutes 7 seconds  Total Procedure Duration: 0 hours 9 minutes 46 seconds       [facility]

## 2019-04-04 NOTE — Progress Notes (Signed)
Date of Admission:  03/26/2019      Subjective: Still has fever Complaining of rectal pain Fatigue and tired  Medications:  . ALPRAZolam  0.5-1 mg Oral BID  . aspirin EC  81 mg Oral Daily  . atorvastatin  20 mg Oral Daily  . Chlorhexidine Gluconate Cloth  6 each Topical Daily  . cholecalciferol  1,000 Units Oral Daily  . feeding supplement (NEPRO CARB STEADY)  237 mL Oral BID BM  . furosemide  40 mg Oral Daily  . gabapentin  1,200 mg Oral QHS  . gabapentin  300 mg Oral Daily  . insulin aspart  0-5 Units Subcutaneous QHS  . insulin aspart  0-9 Units Subcutaneous TID WC  . insulin glargine  8 Units Subcutaneous Daily  . ketorolac  7.5 mg Intravenous Once  . lacosamide  100 mg Oral BID  . metoprolol succinate  50 mg Oral BID  . multivitamin with minerals  1 tablet Oral Daily  . omega-3 acid ethyl esters  1,000 mg Oral Daily  . pantoprazole  40 mg Oral Daily  . PARoxetine  20 mg Oral Daily  . sodium chloride flush  10-40 mL Intracatheter Q12H  . tamsulosin  0.4 mg Oral Daily  . vitamin B-12  2,000 mcg Oral Daily    Objective: Patient Vitals for the past 24 hrs:  BP Temp Temp src Pulse Resp SpO2  04/04/19 0500 121/64 98.1 F (36.7 C) - 88 18 98 %  04/03/19 2240 - - - (!) 114 - 95 %  04/03/19 2152 - 99.2 F (37.3 C) Oral (!) 118 - 95 %  04/03/19 2118 - - - (!) 152 - 95 %  04/03/19 2100 139/73 (!) 101.2 F (38.4 C) - (!) 101 20 98 %  04/03/19 2040 115/77 (!) 102.7 F (39.3 C) Oral (!) 131 20 97 %  04/03/19 1705 137/85 98.2 F (36.8 C) - (!) 108 18 97 %    PHYSICAL EXAM:  Fatigue, some confusion, answers I do not know to most questions, chest bilateral air entry Heart sound tachycardia Abdomen soft CNS did not examine in detail but looks nonfocal Left eye ectropion  Lab Results Recent Labs    04/02/19 0648 04/03/19 0403  WBC 3.5* 4.4  HGB 8.5* 9.7*  HCT 26.0* 30.0*  NA 136 134*  K 3.4* 3.4*  CL 99 95*  CO2 21* 27  BUN 13 12  CREATININE 0.87 0.86    Liver Panel No results for input(s): PROT, ALBUMIN, AST, ALT, ALKPHOS, BILITOT, BILIDIR, IBILI in the last 72 hours. Sedimentation Rate No results for input(s): ESRSEDRATE in the last 72 hours. C-Reactive Protein No results for input(s): CRP in the last 72 hours.  Microbiology: Blood culture on 03/26/2019 and 03/30/2019 - negative Studies/Results: Dg Abd Portable 1v  Result Date: 04/03/2019 CLINICAL DATA:  73 year old male with gastric distension. EXAM: PORTABLE ABDOMEN - 1 VIEW COMPARISON:  CT Abdomen and Pelvis 03/28/2019. FINDINGS: Supine view at 2345 hours. Mildly rotated to the right. Stable visualized osseous structures. Stable small surgical clips in the pelvis. Non obstructed bowel gas pattern. There is some retained oral contrast in nondilated large bowel including the sigmoid colon. No definite pneumoperitoneum on this supine view. IMPRESSION: Nonobstructed bowel-gas pattern with some retained oral contrast in the distal large bowel. Electronically Signed   By: Genevie Ann M.D.   On: 04/03/2019 00:03     Assessment/Plan: Patient admitted after a fall found to be having fever and pancytopenia.  We  will start fever intermittently high-grade.  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 Sigmoidoscopy did not show patients.  Continue Unasyn for now.  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  Seen by oncologist.  Not considered a good candidate for chemotherapy currently.  Neutropenia.  Improvement in the numbers and it is almost resolved.  Phenobarbital was question as the etiology for the neutropenia and patient is being switched to Vimpat and phenobarbital be tapered.  Seizure disorder on periorbital for the past 50 years.  Now it is being tapered and switched to Vimpat.  Seen by neurologist.  History of prostate CA status post prostatectomy.  Last PSA 0.9.  Discussed the management with his sister. She is  thinking about comfort care.  ID will follow him peripherally this weekend.  Call if needed

## 2019-04-04 NOTE — Progress Notes (Signed)
PT Cancellation Note:  Pt currently off unit for procedure and not available for PT session.  Will re-attempt PT treatment session at a later date/time as medically appropriate.  Leitha Bleak, PT 04/04/19, 1:11 PM [phone]

## 2019-04-04 NOTE — Progress Notes (Signed)
Inpatient Diabetes Program Recommendations  AACE/ADA: New Consensus Statement on Inpatient Glycemic Control  Target Ranges:  Prepandial:   less than 140 mg/dL      Peak postprandial:   less than 180 mg/dL (1-2 hours)      Critically ill patients:  140 - 180 mg/dL   Results for ZARRAR, VOGELSANG (MRN [id number]) as of 04/04/2019 09:42  Ref. Range 04/03/2019 07:35 04/03/2019 12:04 04/03/2019 17:04 04/03/2019 22:07 04/04/2019 08:25  Glucose-Capillary Latest Ref Range: 70 - 99 mg/dL 219 (H) 208 (H) 212 (H) 325 (H) 267 (H)   Review of Glycemic Control  Diabetes history:DM2 Outpatient Diabetes medications:Amaryl 4 mg QAM, Amaryl 2 mg QPM, Actos 30 mg daily Current orders for Inpatient glycemic control: Lantus 8 units daily, Novolog 0-9 units TID with meals, Novolog 0-5 units QHS  Inpatient Diabetes Program Recommendations:  Insulin-Basal: Please consider increasing Lantusto 10units daily.  Insulin-Correction: Please consider increasing correction to moderate scale 0-15 units TID.  Thanks, Barnie Alderman, RN, MSN, CDE Diabetes Coordinator Inpatient Diabetes Program [phone] (Team Pager from 8am to 5pm)

## 2019-04-04 NOTE — Progress Notes (Signed)
Hematology/Oncology Consult note [facility]  [phone] [phone]  Patient Care Team: Adin Hector, MD as PCP - General (Internal Medicine)   Name of the patient: Mike [name]  [id number]  [birth year]   Date of visit: 04/04/2019   Interval history-patient remains intermittently confused.  He underwent flexible sigmoidoscopy today.  He remains sedated and not ambulating much out of bed   Review of systems: Limited as patient is a poor historian and remains confused  - Review of Systems  Constitutional: Positive for malaise/fatigue. Negative for chills, fever and weight loss.  HENT: Negative for congestion, ear discharge and nosebleeds.   Eyes: Negative for blurred vision.  Respiratory: Negative for cough, hemoptysis, sputum production, shortness of breath and wheezing.   Cardiovascular: Negative for chest pain, palpitations, orthopnea and claudication.  Gastrointestinal: Negative for abdominal pain, blood in stool, constipation, diarrhea, heartburn, melena, nausea and vomiting.  Genitourinary: Negative for dysuria, flank pain, frequency, hematuria and urgency.  Musculoskeletal: Negative for back pain, joint pain and myalgias.  Skin: Negative for rash.  Neurological: Negative for dizziness, tingling, focal weakness, seizures, weakness and headaches.  Endo/Heme/Allergies: Does not bruise/bleed easily.  Psychiatric/Behavioral: Negative for depression and suicidal ideas. The patient does not have insomnia.        Allergies  Allergen Reactions   Naproxen Sodium Other (See Comments)    Reaction: Upset stomach   Nsaids Other (See Comments)    Other reaction(s): Unknown   Aleve [Naproxen] Nausea And Vomiting   Ibuprofen Nausea Only    Reaction: Upset stomach      Past Medical History:  Diagnosis Date   Anxiety    Diabetes mellitus without complication ([place])    Epilepsy ([place])    High cholesterol    Hypertension     Prostate cancer ([place]) 2004   prostate removed     Past Surgical History:  Procedure Laterality Date   CARDIAC SURGERY     IRRIGATION AND DEBRIDEMENT FOOT Right 06/14/2018   Procedure: IRRIGATION AND DEBRIDEMENT FOOT;  Surgeon: Sharlotte Alamo, DPM;  Location: [facility] ORS;  Service: Podiatry;  Laterality: Right;    Social History   Socioeconomic History   Marital status: Single    Spouse name: Not on file   Number of children: Not on file   Years of education: Not on file   Highest education level: Not on file  Occupational History   Not on file  Social Needs   Financial resource strain: Not on file   Food insecurity    Worry: Not on file    Inability: Not on file   Transportation needs    Medical: Not on file    Non-medical: Not on file  Tobacco Use   Smoking status: Never Smoker   Smokeless tobacco: Never Used  Substance and Sexual Activity   Alcohol use: No   Drug use: No   Sexual activity: Not Currently  Lifestyle   Physical activity    Days per week: Not on file    Minutes per session: Not on file   Stress: Not on file  Relationships   Social connections    Talks on phone: Not on file    Gets together: Not on file    Attends religious service: Not on file    Active member of club or organization: Not on file    Attends meetings of clubs or organizations: Not on file    Relationship status: Not on file  Intimate partner violence    Fear of current or ex partner: Not on file    Emotionally abused: Not on file    Physically abused: Not on file    Forced sexual activity: Not on file  Other Topics Concern   Not on file  Social History Narrative   Not on file    No family history on file.   Current Facility-Administered Medications:    0.9 %  sodium chloride infusion, , Intravenous, Continuous, Tahiliani, Varnita B, MD, Last Rate: 20 mL/hr at 04/04/19 1216   [MAR Hold] acetaminophen (TYLENOL) tablet 650 mg, 650 mg, Oral, Q6H PRN,  650 mg at 04/03/19 2105 **OR** [MAR Hold] acetaminophen (TYLENOL) suppository 650 mg, 650 mg, Rectal, Q6H PRN, [name], Bing Neighbors, NP   [MAR Hold] acetaminophen-codeine (TYLENOL #3) 300-30 MG per tablet 1 tablet, 1 tablet, Oral, Q8H PRN, [name], Bing Neighbors, NP   [MAR Hold] ALPRAZolam Duanne Moron) tablet 0.5-1 mg, 0.5-1 mg, Oral, BID, [name], Bing Neighbors, NP, 1 mg at 04/04/19 0850   [MAR Hold] ampicillin-sulbactam (UNASYN) 1.5 g in sodium chloride 0.9 % 100 mL IVPB, 1.5 g, Intravenous, Q6H, Ravishankar, Joellyn Quails, MD   [MAR Hold] aspirin EC tablet 81 mg, 81 mg, Oral, Daily, [name], Bing Neighbors, NP, 81 mg at 04/04/19 0850   [MAR Hold] atorvastatin (LIPITOR) tablet 20 mg, 20 mg, Oral, Daily, [name], Bing Neighbors, NP, 20 mg at 04/03/19 1732   [MAR Hold] Chlorhexidine Gluconate Cloth 2 % PADS 6 each, 6 each, Topical, Daily, Ojie, Jude, MD, 6 each at 04/04/19 1219   [facility] Hold] cholecalciferol (VITAMIN D3) tablet 1,000 Units, 1,000 Units, Oral, Daily, Lang Snow, NP, 1,000 Units at 04/04/19 0850   [MAR Hold] docusate sodium (COLACE) capsule 100 mg, 100 mg, Oral, BID PRN, Verdell Carmine, Belia Heman, MD   [facility] Hold] feeding supplement (NEPRO CARB STEADY) liquid 237 mL, 237 mL, Oral, BID BM, Mody, Sital, MD, 237 mL at 04/03/19 1312   [MAR Hold] furosemide (LASIX) tablet 40 mg, 40 mg, Oral, Daily, Verdell Carmine, Belia Heman, MD, 40 mg at 04/04/19 0850   [MAR Hold] gabapentin (NEURONTIN) capsule 1,200 mg, 1,200 mg, Oral, QHS, [name], Bing Neighbors, NP, 1,200 mg at 04/03/19 2106   [facility] Hold] gabapentin (NEURONTIN) capsule 300 mg, 300 mg, Oral, Daily, Sainani, Vivek J, MD, 300 mg at 04/04/19 0850   [MAR Hold] insulin aspart (novoLOG) injection 0-15 Units, 0-15 Units, Subcutaneous, TID WC, Mody, Sital, MD   [MAR Hold] insulin aspart (novoLOG) injection 0-5 Units, 0-5 Units, Subcutaneous, QHS, [name], Bing Neighbors, NP, 4 Units at 04/03/19 2237   [facility] Hold] insulin glargine (LANTUS)  injection 10 Units, 10 Units, Subcutaneous, Daily, Mody, Sital, MD   [MAR Hold] ipratropium-albuterol (DUONEB) 0.5-2.5 (3) MG/3ML nebulizer solution 3 mL, 3 mL, Nebulization, Q6H PRN, Stark Jock, Jude, MD   [MAR Hold] ketorolac (TORADOL) 30 MG/ML injection 7.5 mg, 7.5 mg, Intravenous, Once, Lance Coon, MD   [facility] Hold] lacosamide (VIMPAT) tablet 100 mg, 100 mg, Oral, BID, Leotis Pain, MD, 100 mg at 04/04/19 0850   [MAR Hold] LORazepam (ATIVAN) injection 0.5 mg, 0.5 mg, Intravenous, Q4H PRN, Ojie, Jude, MD, 0.5 mg at 04/04/19 1030   [MAR Hold] metoprolol succinate (TOPROL-XL) 24 hr tablet 50 mg, 50 mg, Oral, BID, [name], Bing Neighbors, NP, 50 mg at 04/04/19 0850   [facility] Hold] morphine 2 MG/ML injection 2 mg, 2 mg, Intravenous, Q4H PRN, Mansy, Jan A, MD, 2 mg at 04/04/19 1035   [MAR Hold] multivitamin with minerals tablet 1 tablet, 1  tablet, Oral, Daily, Mody, Sital, MD, 1 tablet at 04/04/19 0850   [MAR Hold] nitroGLYCERIN (NITROSTAT) SL tablet 0.4 mg, 0.4 mg, Sublingual, Q5 min PRN, [name], Bing Neighbors, NP   [facility] Hold] omega-3 acid ethyl esters (LOVAZA) capsule 1,000 mg, 1,000 mg, Oral, Daily, [name], Bing Neighbors, NP, 1,000 mg at 04/04/19 0850   [MAR Hold] opium-belladonna (B&O SUPPRETTES) 16.2-60 MG suppository 1 suppository, 1 suppository, Rectal, Q8H PRN, Ojie, Jude, MD, 1 suppository at 03/31/19 1657   [MAR Hold] pantoprazole (PROTONIX) EC tablet 40 mg, 40 mg, Oral, Daily, [name], Bing Neighbors, NP, 40 mg at 04/04/19 0851   [MAR Hold] PARoxetine (PAXIL) tablet 20 mg, 20 mg, Oral, Daily, [name], Bing Neighbors, NP, 20 mg at 04/04/19 0851   [MAR Hold] polyethylene glycol (MIRALAX / GLYCOLAX) packet 17 g, 17 g, Oral, Daily PRN, Henreitta Leber, MD, 17 g at 03/28/19 0828   [facility] Hold] sodium chloride flush (NS) 0.9 % injection 10-40 mL, 10-40 mL, Intracatheter, Q12H, Sainani, Vivek J, MD, 10 mL at 04/04/19 1000   [MAR Hold] sodium chloride flush (NS) 0.9 % injection 10-40  mL, 10-40 mL, Intracatheter, PRN, Verdell Carmine, Belia Heman, MD   [MAR Hold] tamsulosin (FLOMAX) capsule 0.4 mg, 0.4 mg, Oral, Daily, Ojie, Jude, MD, 0.4 mg at 04/04/19 0850   [MAR Hold] vitamin B-12 (CYANOCOBALAMIN) tablet 2,000 mcg, 2,000 mcg, Oral, Daily, Lang Snow, NP, 2,000 mcg at 04/04/19 0850  Physical exam:  Vitals:   04/04/19 1208 04/04/19 1340 04/04/19 1350 04/04/19 1410  BP: 136/81 98/62 116/62 (!) 132/55  Pulse: 98 (!) 118    Resp: 20 20    Temp: (!) 96.3 F (35.7 C) (!) 96.4 F (35.8 C)    TempSrc: Tympanic Tympanic    SpO2: 98% 99%    Weight:      Height:       Physical Exam Constitutional:      General: He is not in acute distress. HENT:     Head: Normocephalic and atraumatic.  Eyes:     Pupils: Pupils are equal, round, and reactive to light.  Neck:     Musculoskeletal: Normal range of motion.  Cardiovascular:     Rate and Rhythm: Normal rate and regular rhythm.     Heart sounds: Normal heart sounds.  Pulmonary:     Effort: Pulmonary effort is normal.     Breath sounds: Normal breath sounds.  Abdominal:     General: Bowel sounds are normal.     Palpations: Abdomen is soft.  Skin:    General: Skin is warm and dry.  Neurological:     Mental Status: He is alert.     Comments: Oriented to self      CMP Latest Ref Rng & Units 04/04/2019  Glucose 70 - 99 mg/dL 297(H)  BUN 8 - 23 mg/dL 15  Creatinine 0.61 - 1.24 mg/dL 0.98  Sodium 135 - 145 mmol/L 134(L)  Potassium 3.5 - 5.1 mmol/L 3.4(L)  Chloride 98 - 111 mmol/L 90(L)  CO2 22 - 32 mmol/L 30  Calcium 8.9 - 10.3 mg/dL 8.2(L)  Total Protein 6.5 - 8.1 g/dL -  Total Bilirubin 0.3 - 1.2 mg/dL -  Alkaline Phos 38 - 126 U/L -  AST 15 - 41 U/L -  ALT 0 - 44 U/L -   CBC Latest Ref Rng & Units 04/04/2019  WBC 4.0 - 10.5 K/uL 5.4  Hemoglobin 13.0 - 17.0 g/dL 10.0(L)  Hematocrit 39.0 - 52.0 % 30.7(L)  Platelets 150 -  400 K/uL 170    _0 @  Ct Abdomen Pelvis Wo Contrast  Result Date:  03/28/2019 CLINICAL DATA:  Abdominal pain and fever.  Rule out abscess. EXAM: CT ABDOMEN AND PELVIS WITHOUT CONTRAST TECHNIQUE: Multidetector CT imaging of the abdomen and pelvis was performed following the standard protocol without IV contrast. COMPARISON:  None. FINDINGS: Lower chest: Lung bases clear bilaterally. Hepatobiliary: No focal liver abnormality is seen. No gallstones, gallbladder wall thickening, or biliary dilatation. Pancreas: Negative Spleen: Negative Adrenals/Urinary Tract: No renal calculi. No renal mass. Mild fullness of the renal collecting system due to markedly distended urinary bladder. Prior prostatectomy. No bladder mass. Stomach/Bowel: Normal stomach and duodenum. Negative for bowel obstruction. No bowel mass or edema. Appendix not visualized. Vascular/Lymphatic: Negative Reproductive: Prostatectomy.  No pelvic mass Other: Negative for abscess Musculoskeletal: Mild degenerative changes in the lumbar spine. No acute skeletal abnormality. IMPRESSION: Markedly distended urinary bladder. Prior prostatectomy for cancer. No mass lesion in the pelvis. Negative for abscess or source of infection. Electronically Signed   By: Franchot Gallo M.D.   On: 03/28/2019 16:53   Dg Chest 1 View  Result Date: 03/30/2019 CLINICAL DATA:  Fever. EXAM: CHEST  1 VIEW COMPARISON:  March 26, 2019. FINDINGS: The heart size and mediastinal contours are within normal limits. Both lungs are clear. The visualized skeletal structures are unremarkable. IMPRESSION: No active disease. Electronically Signed   By: Marijo Conception M.D.   On: 03/30/2019 08:47   Dg Chest 1 View  Result Date: 03/26/2019 CLINICAL DATA:  UNWITNESSED FALL EXAM: CHEST  1 VIEW COMPARISON:  January 07, 2018 FINDINGS: There is mild cardiomegaly. Overlying median sternotomy wires. Both lungs are clear. 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 IMPRESSION: No acute cardiopulmonary process. Electronically Signed   By: Prudencio Pair M.D.   On: 03/26/2019  21:03   Dg Lumbar Spine 2-3 Views  Result Date: 03/28/2019 CLINICAL DATA:  Recent fall at home. Pt does not remember falling. No known previous injury or surgery to pelvis or lumbar spine. EXAM: LUMBAR SPINE - 2-3 VIEW COMPARISON:  None. FINDINGS: There is no evidence of lumbar spine fracture. Alignment is normal. Intervertebral disc spaces are maintained. Flowing anterior osteophytosis. Gaseous distension of bowel loops throughout the abdomen. No acute finding in the visualized pelvis. SI joints are open. IMPRESSION: No acute osseous abnormality in the lumbar spine Electronically Signed   By: Audie Pinto M.D.   On: 03/28/2019 17:05   Dg Pelvis 1-2 Views  Result Date: 03/28/2019 CLINICAL DATA:  Recent fall at home. Pt does not remember falling. No known previous injury or surgery to pelvis or lumbar spine. EXAM: PELVIS - 1-2 VIEW COMPARISON:  Pelvis radiographs 03/23/2010 FINDINGS: There is no evidence of pelvic fracture or diastasis. Hips appear normally aligned. No focal bony lesion. Bilateral postoperative clips. No pelvic bone lesions are seen. Regional soft tissues unremarkable. IMPRESSION: Negative pelvis radiographs. Electronically Signed   By: Audie Pinto M.D.   On: 03/28/2019 17:07   Dg Shoulder Right  Result Date: 03/30/2019 CLINICAL DATA:  Right shoulder pain without trauma. EXAM: RIGHT SHOULDER - 2+ VIEW COMPARISON:  06/05/2018 FINDINGS: No acute fracture or dislocation. Visualized portion of the right hemithorax is normal. Median sternotomy. Mild degradation secondary to positioning. IMPRESSION: No acute osseous abnormality. Electronically Signed   By: Abigail Miyamoto M.D.   On: 03/30/2019 14:55   Ct Head Wo Contrast  Result Date: 03/26/2019 CLINICAL DATA:  Head trauma, unwitnessed fall EXAM: CT HEAD WITHOUT CONTRAST TECHNIQUE: Contiguous  axial images were obtained from the base of the skull through the vertex without intravenous contrast. COMPARISON:  February 25, 2019  FINDINGS: Brain: No evidence of acute territorial infarction, hemorrhage, hydrocephalus,extra-axial collection or mass lesion/mass effect. There is mild dilatation the ventricles and sulci consistent with age-related atrophy. Low-attenuation changes in the deep white matter consistent with small vessel ischemia. Vascular: No hyperdense vessel or unexpected calcification. Skull: The skull is intact. No fracture or focal lesion identified. Sinuses/Orbits: The visualized paranasal sinuses and mastoid air cells are clear. The orbits and globes intact. Other: None IMPRESSION: No acute intracranial abnormality. Findings consistent with age related atrophy and chronic small vessel ischemia Electronically Signed   By: Prudencio Pair M.D.   On: 03/26/2019 22:36   Dg Abd Portable 1v  Result Date: 04/03/2019 CLINICAL DATA:  73 year old male with gastric distension. EXAM: PORTABLE ABDOMEN - 1 VIEW COMPARISON:  CT Abdomen and Pelvis 03/28/2019. FINDINGS: Supine view at 2345 hours. Mildly rotated to the right. Stable visualized osseous structures. Stable small surgical clips in the pelvis. Non obstructed bowel gas pattern. There is some retained oral contrast in nondilated large bowel including the sigmoid colon. No definite pneumoperitoneum on this supine view. IMPRESSION: Nonobstructed bowel-gas pattern with some retained oral contrast in the distal large bowel. Electronically Signed   By: Genevie Ann M.D.   On: 04/03/2019 00:03   Ct Bone Marrow Biopsy & Aspiration  Result Date: 04/01/2019 INDICATION: 73 year old male with pancytopenia EXAM: CT GUIDED BONE MARROW ASPIRATION AND CORE BIOPSY Interventional Radiologist:  Criselda Peaches, MD MEDICATIONS: None. ANESTHESIA/SEDATION: Moderate (conscious) sedation was employed during this procedure. A total of 2 milligrams versed and 75 micrograms fentanyl were administered intravenously. The patient's level of consciousness and vital signs were monitored continuously by  radiology nursing throughout the procedure under my direct supervision. Total monitored sedation time: 10 minutes FLUOROSCOPY TIME:  None COMPLICATIONS: None immediate. Estimated blood loss: <25 mL PROCEDURE: Informed written consent was obtained from the patient after a thorough discussion of the procedural risks, benefits and alternatives. All questions were addressed. Maximal Sterile Barrier Technique was utilized including caps, mask, sterile gowns, sterile gloves, sterile drape, hand hygiene and skin antiseptic. A timeout was performed prior to the initiation of the procedure. The patient was positioned prone and non-contrast localization CT was performed of the pelvis to demonstrate the iliac marrow spaces. Maximal barrier sterile technique utilized including caps, mask, sterile gowns, sterile gloves, large sterile drape, hand hygiene, and betadine prep. Under sterile conditions and local anesthesia, an 11 gauge coaxial bone biopsy needle was advanced into the right iliac marrow space. Needle position was confirmed with CT imaging. Initially, bone marrow aspiration was performed. Next, the 11 gauge outer cannula was utilized to obtain a right iliac bone marrow core biopsy. Needle was removed. Hemostasis was obtained with compression. The patient tolerated the procedure well. Samples were prepared with the cytotechnologist. IMPRESSION: Technically successful CT-guided bone marrow aspiration and core biopsy of the right iliac bone. Electronically Signed   By: Jacqulynn Cadet M.D.   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  I have spoken to pathologist Dr. Melina Copa who has reviewed his bone marrow biopsy and confirm that the results are indeed concordant with AML.  Patient had significant anemia but over the last couple of days patient had significant anemia but over the last couple of days his hemoglobin has improved and his platelet  count has  normalized.  However he does have 30 to 40% blasts in his bone marrow and some of them have a monocytic differentiation likely accounting for fewer percentage seen on the flow cytometry and the results are consistent with AML.  He was also noted to have myeloblasts in his peripheral flow cytometry.  2.  I have discussed these findings with the patient's sister Ms. Darrel Hoover.  She states that prior to this hospitalization patient was living independently and was able to make decisions for himself.  He has been getting weaker however over the last couple of weeks.  The confusion particularly has been new and the etiology is uncertain.  CT head on admission did not show any acute changes.  It may be worthwhile to get an MRI of his brain at this time to see if there are any acute changes that would explain his confusion.  I also explained to her if he continues to have ongoing encephalopathy- LP may have to be considered.  She wishes to avoid invasive procedures such as LP at this time.  3.  With regards to treatment for AML: He is not a candidate for intensive induction chemotherapy and a bone marrow transplant.  Presently his performance status is poor and he will likely be going to a rehab as he cannot go back to living alone independently.  He still needs to work with physical therapy.  If his performance status improves at rehab-outpatient-methylating agent such as azathioprine could be considered which needs to be given 7 days consecutively every month has a median survival of about 10 months.  However if his performance status continues to decline then best supportive care/hospice may be appropriate.  Untreated AML has poor prognosis less than 3 to 6 months.  With hypomethylating treatment such as azacitidine plus minus venetoclax patients can have response rates up to 70%.  Patient continues to be febrile and his last T-max was 101.2 at 2100 yesterday.  ID has evaluated the patient and does not feel that his  fever is infectious and possibly coming from his AML.  He also underwent a flexible sigmoidoscopy today which showed superficial erosion/ulceration in his right buttock but no acute pathology in the rectum.  I have explained his treatment options to patient's sister who would like to discuss this with other siblings before deciding which way she would like to proceed.  I will evaluate the patient again tomorrow and touch base with his sister as well   Total face to face encounter time for this patient visit was 40 min. >50% of the time was  spent in counseling and coordination of care.     Visit Diagnosis 1. Fall, initial encounter   2. Fever   3. Altered mental status, unspecified altered mental status type   4. Sepsis, due to unspecified organism, unspecified whether acute organ dysfunction present ([place])   5. Fall   6. Pain   7. Pancytopenia ([place])   8. Gastric distention      Dr. Randa Evens, MD, MPH [facility] [phone] 04/04/2019 2:58 PM

## 2019-04-04 NOTE — Plan of Care (Signed)
When cleaning pt and administering enemas discovered pt had a pressure injury on each buttock.  Will request dr put in a wound consult - believe he needs a special air loss mattress.

## 2019-04-04 NOTE — Progress Notes (Signed)
Vonda Antigua, MD [address] [place], [place], [place], [address] [address], [place], [place], [place], [address] Phone: [phone]  Fax: [phone]   Subjective: Pt has had some rectal pain without any bleeding. No diarrhea.    Objective: Exam: Vital signs in last 24 hours: Vitals:   04/03/19 2240 04/04/19 0500 04/04/19 0900 04/04/19 1208  BP:  121/64 (!) 150/93 136/81  Pulse: (!) 114 88 (!) 112 98  Resp:  18  20  Temp:  98.1 F (36.7 C)  (!) 96.3 F (35.7 C)  TempSrc:    Tympanic  SpO2: 95% 98%  98%  Weight:      Height:       Weight change:   Intake/Output Summary (Last 24 hours) at 04/04/2019 1249 Last data filed at 04/04/2019 0700 Gross per 24 hour  Intake -  Output 4000 ml  Net -4000 ml    General: No acute distress, AAO x3 Abd: Soft, NT/ND, No HSM Skin: Warm, no rashes Neck: Supple, Trachea midline   Lab Results: Lab Results  Component Value Date   WBC 5.4 04/04/2019   HGB 10.0 (L) 04/04/2019   HCT 30.7 (L) 04/04/2019   MCV 74.3 (L) 04/04/2019   PLT 170 04/04/2019   Micro Results: Recent Results (from the past 240 hour(s))  Culture, blood (Routine x 2)     Status: None   Collection Time: 03/26/19  8:12 PM   Specimen: BLOOD  Result Value Ref Range Status   Specimen Description BLOOD LEFT ANTECUBITAL  Final   Special Requests   Final    BOTTLES DRAWN AEROBIC AND ANAEROBIC Blood Culture adequate volume   Culture   Final    NO GROWTH 5 DAYS Performed at [facility], [address] [place], [place] [address]    Report Status 03/31/2019 FINAL  Final  Culture, blood (Routine x 2)     Status: None   Collection Time: 03/26/19  8:50 PM   Specimen: BLOOD  Result Value Ref Range Status   Specimen Description BLOOD BLOOD RIGHT HAND  Final   Special Requests   Final    BOTTLES DRAWN AEROBIC AND ANAEROBIC Blood Culture adequate volume   Culture   Final    NO GROWTH 5 DAYS Performed at [facility], [address], [place], [place] [address]    Report Status 03/31/2019 FINAL  Final  SARS Coronavirus 2 [facility] order, Performed in [facility] hospital lab) Nasopharyngeal Nasopharyngeal Swab     Status: None   Collection Time: 03/26/19  8:50 PM   Specimen: Nasopharyngeal Swab  Result Value Ref Range Status   SARS Coronavirus 2 NEGATIVE NEGATIVE Final    Comment: (NOTE) If result is NEGATIVE SARS-CoV-2 target nucleic acids are NOT DETECTED. The SARS-CoV-2 RNA is generally detectable in upper and lower  respiratory specimens during the acute phase of infection. The lowest  concentration of SARS-CoV-2 viral copies this assay can detect is 250  copies / mL. A negative result does not preclude SARS-CoV-2 infection  and should not be used as the sole basis for treatment or other  patient management decisions.  A negative result may occur with  improper specimen collection / handling, submission of specimen other  than nasopharyngeal swab, presence of viral mutation(s) within the  areas targeted by this assay, and inadequate number of viral copies  (<250 copies / mL). A negative result must be combined with clinical  observations, patient history, and epidemiological information. If result  is POSITIVE SARS-CoV-2 target nucleic acids are DETECTED. The SARS-CoV-2 RNA is generally detectable in upper and lower  respiratory specimens dur ing the acute phase of infection.  Positive  results are indicative of active infection with SARS-CoV-2.  Clinical  correlation with patient history and other diagnostic information is  necessary to determine patient infection status.  Positive results do  not rule out bacterial infection or co-infection with other viruses. If result is PRESUMPTIVE POSTIVE SARS-CoV-2 nucleic acids MAY BE PRESENT.   A presumptive positive result was obtained on the submitted specimen  and confirmed on repeat testing.  While 2019 novel coronavirus  (SARS-CoV-2) nucleic acids  may be present in the submitted sample  additional confirmatory testing may be necessary for epidemiological  and / or clinical management purposes  to differentiate between  SARS-CoV-2 and other Sarbecovirus currently known to infect humans.  If clinically indicated additional testing with an alternate test  methodology [phone]) is advised. The SARS-CoV-2 RNA is generally  detectable in upper and lower respiratory sp ecimens during the acute  phase of infection. The expected result is Negative. Fact Sheet for Patients:  StrictlyIdeas.no Fact Sheet for Healthcare Providers: BankingDealers.co.za This test is not yet approved or cleared by the [place] FDA and has been authorized for detection and/or diagnosis of SARS-CoV-2 by FDA under an Emergency Use Authorization (EUA).  This EUA will remain in effect (meaning this test can be used) for the duration of the COVID-19 declaration under Section 564(b)(1) of the Act, 21 U.S.C. section 360bbb-3(b)(1), unless the authorization is terminated or revoked sooner. Performed at [facility], [address] [place], [place] [address]   Urine culture     Status: None   Collection Time: 03/26/19  8:51 PM   Specimen: In/Out Cath Urine  Result Value Ref Range Status   Specimen Description   Final    IN/OUT CATH URINE Performed at [facility], [address], [place], [place] [address]    Special Requests   Final    NONE Performed at [facility], [address] [place], [place] [address]    Culture   Final    NO GROWTH Performed at [facility], [place] [address], [place], [place] [address]    Report Status 03/27/2019 FINAL  Final  MRSA PCR Screening     Status: None   Collection Time: 03/27/19 10:00 AM   Specimen: Nasal Mucosa; Nasopharyngeal  Result Value Ref Range Status   MRSA by PCR NEGATIVE NEGATIVE Final    Comment:        The GeneXpert MRSA  Assay (FDA approved for NASAL specimens only), is one component of a comprehensive MRSA colonization surveillance program. It is not intended to diagnose MRSA infection nor to guide or monitor treatment for MRSA infections. Performed at [facility], [address] [place], [place] [address]   CULTURE, BLOOD (ROUTINE X 2) w Reflex to ID Panel     Status: None   Collection Time: 03/30/19  9:13 AM   Specimen: BLOOD  Result Value Ref Range Status   Specimen Description BLOOD R HAND  Final   Special Requests   Final    BOTTLES DRAWN AEROBIC AND ANAEROBIC Blood Culture adequate volume   Culture   Final    NO GROWTH 5 DAYS Performed at [facility], [address] [place], [place] [address]    Report Status 04/04/2019 FINAL  Final  CULTURE, BLOOD (ROUTINE X 2) w Reflex  to ID Panel     Status: None   Collection Time: 03/30/19  9:21 AM   Specimen: BLOOD  Result Value Ref Range Status   Specimen Description BLOOD R AC  Final   Special Requests   Final    BOTTLES DRAWN AEROBIC AND ANAEROBIC Blood Culture adequate volume   Culture   Final    NO GROWTH 5 DAYS Performed at [facility], [address] [place], [place] [address]    Report Status 04/04/2019 FINAL  Final  Urine Culture     Status: None   Collection Time: 03/30/19  9:21 AM   Specimen: Urine, Catheterized  Result Value Ref Range Status   Specimen Description   Final    URINE, CATHETERIZED Performed at [facility], [address] [place], [place] [address]    Special Requests   Final    NONE Performed at [facility], [address] [place], [place] [address]    Culture   Final    NO GROWTH Performed at [facility], [place] [address] [place], [place] [address]    Report Status 03/31/2019 FINAL  Final   Studies/Results: Dg Abd Portable 1v  Result Date: 04/03/2019 CLINICAL DATA:  73 year old male with gastric distension. EXAM: PORTABLE ABDOMEN - 1 VIEW  COMPARISON:  CT Abdomen and Pelvis 03/28/2019. FINDINGS: Supine view at 2345 hours. Mildly rotated to the right. Stable visualized osseous structures. Stable small surgical clips in the pelvis. Non obstructed bowel gas pattern. There is some retained oral contrast in nondilated large bowel including the sigmoid colon. No definite pneumoperitoneum on this supine view. IMPRESSION: Nonobstructed bowel-gas pattern with some retained oral contrast in the distal large bowel. Electronically Signed   By: Genevie Ann M.D.   On: 04/03/2019 00:03   Medications:  Scheduled Meds: . [MAR Hold] ALPRAZolam  0.5-1 mg Oral BID  . [MAR Hold] aspirin EC  81 mg Oral Daily  . [MAR Hold] atorvastatin  20 mg Oral Daily  . [MAR Hold] Chlorhexidine Gluconate Cloth  6 each Topical Daily  . [MAR Hold] cholecalciferol  1,000 Units Oral Daily  . [MAR Hold] feeding supplement (NEPRO CARB STEADY)  237 mL Oral BID BM  . [MAR Hold] furosemide  40 mg Oral Daily  . [MAR Hold] gabapentin  1,200 mg Oral QHS  . [MAR Hold] gabapentin  300 mg Oral Daily  . [MAR Hold] insulin aspart  0-15 Units Subcutaneous TID WC  . [MAR Hold] insulin aspart  0-5 Units Subcutaneous QHS  . [MAR Hold] insulin glargine  10 Units Subcutaneous Daily  . [MAR Hold] ketorolac  7.5 mg Intravenous Once  . [MAR Hold] lacosamide  100 mg Oral BID  . [MAR Hold] metoprolol succinate  50 mg Oral BID  . [MAR Hold] multivitamin with minerals  1 tablet Oral Daily  . [MAR Hold] omega-3 acid ethyl esters  1,000 mg Oral Daily  . [MAR Hold] pantoprazole  40 mg Oral Daily  . [MAR Hold] PARoxetine  20 mg Oral Daily  . [MAR Hold] sodium chloride flush  10-40 mL Intracatheter Q12H  . [MAR Hold] tamsulosin  0.4 mg Oral Daily  . [MAR Hold] vitamin B-12  2,000 mcg Oral Daily   Continuous Infusions: . sodium chloride 1,000 mL (04/04/19 1216)  . 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 This is reasonable  Will proceed with Flex-sig with enema prep today  Pt has not had anything to eat today and anesthesia is agreeable with proceeding  I have discussed alternative options, risks & benefits,  which include, but are not limited to, bleeding, infection, perforation,respiratory complication & drug reaction.  The patient agrees with this plan & written consent will be obtained.    Hemoglobin stable at 10   LOS: 8 days   Vonda Antigua, MD 04/04/2019, 12:49 PM

## 2019-04-05 ENCOUNTER — Inpatient Hospital Stay: Payer: Medicare Other

## 2019-04-05 DIAGNOSIS — L8990 Pressure ulcer of unspecified site, unspecified stage: Secondary | ICD-10-CM | POA: Insufficient documentation

## 2019-04-05 LAB — CBC
HCT: 30.9 % — ABNORMAL LOW (ref 39.0–52.0)
Hemoglobin: 10 g/dL — ABNORMAL LOW (ref 13.0–17.0)
MCH: 24.1 pg — ABNORMAL LOW (ref 26.0–34.0)
MCHC: 32.4 g/dL (ref 30.0–36.0)
MCV: 74.5 fL — ABNORMAL LOW (ref 80.0–100.0)
Platelets: 152 10*3/uL (ref 150–400)
RBC: 4.15 MIL/uL — ABNORMAL LOW (ref 4.22–5.81)
RDW: 22.1 % — ABNORMAL HIGH (ref 11.5–15.5)
WBC: 7.1 10*3/uL (ref 4.0–10.5)
nRBC: 0.7 % — ABNORMAL HIGH (ref 0.0–0.2)

## 2019-04-05 LAB — COPPER, SERUM: Copper: 230 ug/dL — ABNORMAL HIGH (ref 72–166)

## 2019-04-05 LAB — GLUCOSE, CAPILLARY
Glucose-Capillary: 255 mg/dL — ABNORMAL HIGH (ref 70–99)
Glucose-Capillary: 370 mg/dL — ABNORMAL HIGH (ref 70–99)
Glucose-Capillary: 375 mg/dL — ABNORMAL HIGH (ref 70–99)
Glucose-Capillary: 365 mg/dL — ABNORMAL HIGH (ref 70–99)

## 2019-04-05 NOTE — Progress Notes (Addendum)
Pt more confused and unable to communicate his name. Drinks supplements readily and water. Foley maintained. Family in to visit. Pt now requiring total care. Temp 100.0 this a.m.  Family encouraged to discuss future care decisions.  Elevated POCT glucose's covered. Increased generalized weakness.

## 2019-04-05 NOTE — Progress Notes (Signed)
[place] [facility] [place] NAME: Mike [name]    MR#:  [id number]  DATE OF BIRTH:  03/13/1946  SUBJECTIVE:  Patient with perissitent fevers no events overnight  REVIEW OF SYSTEMS:    Patient not reliable   Tolerating Diet: yes      DRUG ALLERGIES:   Allergies  Allergen Reactions  . Naproxen Sodium Other (See Comments)    Reaction: Upset stomach  . Nsaids Other (See Comments)    Other reaction(s): Unknown  . Aleve [Naproxen] Nausea And Vomiting  . Ibuprofen Nausea Only    Reaction: Upset stomach     VITALS:  Blood pressure 123/82, pulse (!) 105, temperature 100 F (37.8 C), resp. rate 20, height 5\' 7"  (1.702 m), weight 90 kg, SpO2 97 %.  PHYSICAL EXAMINATION:  Constitutional: Appears well-developed and well-nourished. No distress. HENT: Normocephalic. Marland Kitchen Oropharynx is clear and moist.  Eyes: Conjunctivae and EOM are normal. PERRLA, no scleral icterus.  Neck: Normal ROM. Neck supple. No JVD. No tracheal deviation. CVS: RRR, S1/S2 +, no murmurs, no gallops, no carotid bruit.  Pulmonary: Effort and breath sounds normal, no stridor, rhonchi, wheezes, rales.  Abdominal: Soft. BS +,  no distension, tenderness, rebound or guarding.  Musculoskeletal: Normal range of motion. No edema and no tenderness. Left arm swelling better Neuro: Alert. CN 2-12 grossly intact. No focal deficits. Skin: Skin is warm and dry. No rash noted. Psychiatric: Normal mood and affect.      LABORATORY PANEL:   CBC Recent Labs  Lab 04/05/19 0632  WBC 7.1  HGB 10.0*  HCT 30.9*  PLT 152   ------------------------------------------------------------------------------------------------------------------  Chemistries  Recent Labs  Lab 04/02/19 0648  04/04/19 0941  NA 136   < > 134*  K 3.4*   < > 3.4*  CL 99   < > 90*  CO2 21*   < > 30  GLUCOSE 173*   < > 297*  BUN 13   < > 15  CREATININE 0.87   < > 0.98  CALCIUM 8.1*   < > 8.2*  MG 1.8  --   --     < > = values in this interval not displayed.   ------------------------------------------------------------------------------------------------------------------  Cardiac Enzymes No results for input(s): TROPONINI in the last 168 hours. ------------------------------------------------------------------------------------------------------------------  RADIOLOGY:  Dg Chest 1 View  Result Date: 04/05/2019 CLINICAL DATA:  Fever EXAM: CHEST  1 VIEW COMPARISON:  None. FINDINGS: Lungs are clear.  No pleural effusion or pneumothorax. The heart is normal in size. Postsurgical changes related to prior CABG. Median sternotomy. IMPRESSION: No evidence of acute cardiopulmonary disease. Electronically Signed   By: [name] M.D.   On: 04/05/2019 09:39     ASSESSMENT AND PLAN:   73 year old male with history of seizures and hypertension who presented to the emergency room due to fever and shortness of breath.  1.  Fever: Initially patient felt to have sepsis due to fever and tachycardia.  ID has evaluated patient and he was not initially on antibiotics.  All cultures have been negative.  His fevers are not due to an acute infection.  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  Sepsis has been ruled out.   He is not a candidate for intensive induction chemotherapy and a bone marrow transplant.  Presently his performance status is poor and he will likely be going to a rehab as he cannot go back to living alone independently.  He still needs to work with physical  therapy.  If his performance status improves at rehab-outpatient-methylating agent such as azathioprine could be considered which needs to be given 7 days consecutively every month has a median survival of about 10 months.  However if his performance status continues to decline then best supportive care/hospice may be appropriate.  Untreated AML has poor prognosis less than 3 to 6 months.     2.  Pancytopenia: This has improved.  Etiology combination of  AML as well as phenobarbital.   3.  Seizure disorder: Due to ongoing fever and pancytopenia it has been recommended to discontinue phenobarbital which was weaned off. He will continue Vimpat  4:Anxiety: Continue Paxil and Xanax  5.  Essential hypertension: Continue Toprol  6.  Diabetes: Continue ADA diet with sliding scale and Lantus as per recommendations by diabetes nurse.  7.  BPH: Continue Flomax  Palliative care consultation placed. He would benefit from skilled nursing facility placement with palliative care.   Management plans discussed with the nursing  CODE STATUS: full  TOTAL TIME TAKING CARE OF THIS PATIENT: 24 minutes.     POSSIBLE D/C 2-4 days, DEPENDING ON CLINICAL CONDITION.   Bettey Costa M.D on 04/05/2019 at 10:06 AM  Between 7am to 6pm - Pager - [phone] After 6pm go to [email or web address] - password [email or web address] [facility]  Office  [phone]  CC: Primary care physician; Adin Hector, MD  Note: This dictation was prepared with Dragon dictation along with smaller phrase technology. Any transcriptional errors that result from this process are unintentional.

## 2019-04-05 NOTE — Progress Notes (Signed)
Temp 102.8 with tylenol given. Unsyn IV continued. MD notified with no new orders. Sister at bedside updated. TC from [place], sister asking to speak with doctor regarding code status. Secure Chat message sent to MD with request to contact sister with phone number to discuss.

## 2019-04-05 NOTE — Progress Notes (Signed)
Hematology/Oncology Consult note [facility]  [phone] [phone]  Patient Care Team: Adin Hector, MD as PCP - General (Internal Medicine)   Name of the patient: Mike [name]  [id number]  11/01/1945   Date of visit: 04/05/2019  Interval history- sister at bedside. Says that patient recognized her and talks to her occasionally. He did not respond appropriately to my questions   Review of systems- Review of Systems  Unable to perform ROS: Mental status change     Allergies  Allergen Reactions   Naproxen Sodium Other (See Comments)    Reaction: Upset stomach   Nsaids Other (See Comments)    Other reaction(s): Unknown   Aleve [Naproxen] Nausea And Vomiting   Ibuprofen Nausea Only    Reaction: Upset stomach      Past Medical History:  Diagnosis Date   Anxiety    Diabetes mellitus without complication ([place])    Epilepsy ([place])    High cholesterol    Hypertension    Prostate cancer ([place]) 2004   prostate removed     Past Surgical History:  Procedure Laterality Date   CARDIAC SURGERY     IRRIGATION AND DEBRIDEMENT FOOT Right 06/14/2018   Procedure: IRRIGATION AND DEBRIDEMENT FOOT;  Surgeon: Sharlotte Alamo, DPM;  Location: [facility] ORS;  Service: Podiatry;  Laterality: Right;    Social History   Socioeconomic History   Marital status: Single    Spouse name: Not on file   Number of children: Not on file   Years of education: Not on file   Highest education level: Not on file  Occupational History   Not on file  Social Needs   Financial resource strain: Not on file   Food insecurity    Worry: Not on file    Inability: Not on file   Transportation needs    Medical: Not on file    Non-medical: Not on file  Tobacco Use   Smoking status: Never Smoker   Smokeless tobacco: Never Used  Substance and Sexual Activity   Alcohol use: No   Drug use: No   Sexual activity: Not Currently  Lifestyle    Physical activity    Days per week: Not on file    Minutes per session: Not on file   Stress: Not on file  Relationships   Social connections    Talks on phone: Not on file    Gets together: Not on file    Attends religious service: Not on file    Active member of club or organization: Not on file    Attends meetings of clubs or organizations: Not on file    Relationship status: Not on file   Intimate partner violence    Fear of current or ex partner: Not on file    Emotionally abused: Not on file    Physically abused: Not on file    Forced sexual activity: Not on file  Other Topics Concern   Not on file  Social History Narrative   Not on file    No family history on file.   Current Facility-Administered Medications:    acetaminophen (TYLENOL) tablet 650 mg, 650 mg, Oral, Q6H PRN, 650 mg at 04/05/19 1637 **OR** acetaminophen (TYLENOL) suppository 650 mg, 650 mg, Rectal, Q6H PRN, [name], Bing Neighbors, NP   acetaminophen-codeine (TYLENOL #3) 300-30 MG per tablet 1 tablet, 1 tablet, Oral, Q8H PRN, [name], Bing Neighbors, NP   ALPRAZolam Duanne Moron) tablet 0.5-1 mg, 0.5-1 mg,  Oral, BID, Lang Snow, NP, 0.5 mg at 04/05/19 2124   ampicillin-sulbactam (UNASYN) 1.5 g in sodium chloride 0.9 % 100 mL IVPB, 1.5 g, Intravenous, Q6H, Ravishankar, Joellyn Quails, MD, Last Rate: 200 mL/hr at 04/05/19 1600   aspirin EC tablet 81 mg, 81 mg, Oral, Daily, [name], Bing Neighbors, NP, 81 mg at 04/05/19 0916   atorvastatin (LIPITOR) tablet 20 mg, 20 mg, Oral, Daily, [name], Bing Neighbors, NP, 20 mg at 04/05/19 1723   Chlorhexidine Gluconate Cloth 2 % PADS 6 each, 6 each, Topical, Daily, Ojie, Jude, MD, 6 each at 04/05/19 1975   cholecalciferol (VITAMIN D3) tablet 1,000 Units, 1,000 Units, Oral, Daily, Lang Snow, NP, 1,000 Units at 04/05/19 0916   docusate sodium (COLACE) capsule 100 mg, 100 mg, Oral, BID PRN, Verdell Carmine, Belia Heman, MD   feeding supplement (NEPRO  CARB STEADY) liquid 237 mL, 237 mL, Oral, BID BM, Mody, Sital, MD, 237 mL at 04/05/19 1349   furosemide (LASIX) tablet 40 mg, 40 mg, Oral, Daily, Henreitta Leber, MD, 40 mg at 04/05/19 0916   gabapentin (NEURONTIN) capsule 1,200 mg, 1,200 mg, Oral, QHS, [name], Bing Neighbors, NP, 1,200 mg at 04/05/19 2125   gabapentin (NEURONTIN) capsule 300 mg, 300 mg, Oral, Daily, Verdell Carmine, Vivek J, MD, 300 mg at 04/05/19 0916   insulin aspart (novoLOG) injection 0-15 Units, 0-15 Units, Subcutaneous, TID WC, Mody, Sital, MD, 15 Units at 04/05/19 1720   insulin aspart (novoLOG) injection 0-5 Units, 0-5 Units, Subcutaneous, QHS, [name], Bing Neighbors, NP, 3 Units at 04/04/19 2227   insulin glargine (LANTUS) injection 10 Units, 10 Units, Subcutaneous, Daily, Mody, Sital, MD, 10 Units at 04/05/19 0914   ipratropium-albuterol (DUONEB) 0.5-2.5 (3) MG/3ML nebulizer solution 3 mL, 3 mL, Nebulization, Q6H PRN, Ojie, Jude, MD   ketorolac (TORADOL) 15 MG/ML injection 7.5 mg, 7.5 mg, Intravenous, Once, Lance Coon, MD   lacosamide (VIMPAT) tablet 100 mg, 100 mg, Oral, BID, Leotis Pain, MD, 100 mg at 04/05/19 2124   LORazepam (ATIVAN) injection 0.5 mg, 0.5 mg, Intravenous, Q4H PRN, Ojie, Jude, MD, 0.5 mg at 04/04/19 1030   metoprolol succinate (TOPROL-XL) 24 hr tablet 50 mg, 50 mg, Oral, BID, [name], Bing Neighbors, NP, 50 mg at 04/05/19 2124   morphine 2 MG/ML injection 2 mg, 2 mg, Intravenous, Q4H PRN, Mansy, Jan A, MD, 2 mg at 04/05/19 0704   multivitamin with minerals tablet 1 tablet, 1 tablet, Oral, Daily, Mody, Sital, MD, 1 tablet at 04/05/19 0915   nitroGLYCERIN (NITROSTAT) SL tablet 0.4 mg, 0.4 mg, Sublingual, Q5 min PRN, [name], Bing Neighbors, NP   omega-3 acid ethyl esters (LOVAZA) capsule 1,000 mg, 1,000 mg, Oral, Daily, [name], Bing Neighbors, NP, 1,000 mg at 04/05/19 0915   opium-belladonna (B&O SUPPRETTES) 16.2-60 MG suppository 1 suppository, 1 suppository, Rectal, Q8H PRN, Ojie,  Jude, MD, 1 suppository at 03/31/19 1657   pantoprazole (PROTONIX) EC tablet 40 mg, 40 mg, Oral, Daily, [name], Bing Neighbors, NP, 40 mg at 04/05/19 8832   PARoxetine (PAXIL) tablet 20 mg, 20 mg, Oral, Daily, [name], Bing Neighbors, NP, 20 mg at 04/05/19 0915   polyethylene glycol (MIRALAX / GLYCOLAX) packet 17 g, 17 g, Oral, Daily PRN, Henreitta Leber, MD, 17 g at 03/28/19 0828   sodium chloride flush (NS) 0.9 % injection 10-40 mL, 10-40 mL, Intracatheter, Q12H, Sainani, Vivek J, MD, 10 mL at 04/05/19 2127   sodium chloride flush (NS) 0.9 % injection 10-40 mL, 10-40 mL, Intracatheter, PRN, Sainani, Belia Heman, MD, 10 mL at  04/05/19 1118   tamsulosin (FLOMAX) capsule 0.4 mg, 0.4 mg, Oral, Daily, Ojie, Jude, MD, 0.4 mg at 04/05/19 5009   vitamin B-12 (CYANOCOBALAMIN) tablet 2,000 mcg, 2,000 mcg, Oral, Daily, Lang Snow, NP, 2,000 mcg at 04/05/19 3818  Physical exam:  Vitals:   04/05/19 1634 04/05/19 1723 04/05/19 2019 04/05/19 2121  BP: (!) 143/71  110/67 119/66  Pulse: (!) 110  (!) 112 (!) 110  Resp: 20  18   Temp: (!) 102.8 F (39.3 C) 99.6 F (37.6 C) 98.7 F (37.1 C)   TempSrc:  Oral Oral   SpO2: 96%  95% 95%  Weight:      Height:       Physical Exam HENT:     Head: Normocephalic and atraumatic.  Eyes:     Pupils: Pupils are equal, round, and reactive to light.  Neck:     Musculoskeletal: Normal range of motion.  Cardiovascular:     Rate and Rhythm: Normal rate and regular rhythm.     Heart sounds: Normal heart sounds.  Pulmonary:     Effort: Pulmonary effort is normal.     Breath sounds: Normal breath sounds.  Abdominal:     General: Bowel sounds are normal.     Palpations: Abdomen is soft.  Skin:    General: Skin is warm and dry.  Neurological:     Mental Status: He is alert.     Comments: Oriented to self      CMP Latest Ref Rng & Units 04/04/2019  Glucose 70 - 99 mg/dL 297(H)  BUN 8 - 23 mg/dL 15  Creatinine 0.61 - 1.24 mg/dL 0.98    Sodium 135 - 145 mmol/L 134(L)  Potassium 3.5 - 5.1 mmol/L 3.4(L)  Chloride 98 - 111 mmol/L 90(L)  CO2 22 - 32 mmol/L 30  Calcium 8.9 - 10.3 mg/dL 8.2(L)  Total Protein 6.5 - 8.1 g/dL -  Total Bilirubin 0.3 - 1.2 mg/dL -  Alkaline Phos 38 - 126 U/L -  AST 15 - 41 U/L -  ALT 0 - 44 U/L -   CBC Latest Ref Rng & Units 04/05/2019  WBC 4.0 - 10.5 K/uL 7.1  Hemoglobin 13.0 - 17.0 g/dL 10.0(L)  Hematocrit 39.0 - 52.0 % 30.9(L)  Platelets 150 - 400 K/uL 152    '@IMAGES' @  Ct Abdomen Pelvis Wo Contrast  Result Date: 03/28/2019 CLINICAL DATA:  Abdominal pain and fever.  Rule out abscess. EXAM: CT ABDOMEN AND PELVIS WITHOUT CONTRAST TECHNIQUE: Multidetector CT imaging of the abdomen and pelvis was performed following the standard protocol without IV contrast. COMPARISON:  None. FINDINGS: Lower chest: Lung bases clear bilaterally. Hepatobiliary: No focal liver abnormality is seen. No gallstones, gallbladder wall thickening, or biliary dilatation. Pancreas: Negative Spleen: Negative Adrenals/Urinary Tract: No renal calculi. No renal mass. Mild fullness of the renal collecting system due to markedly distended urinary bladder. Prior prostatectomy. No bladder mass. Stomach/Bowel: Normal stomach and duodenum. Negative for bowel obstruction. No bowel mass or edema. Appendix not visualized. Vascular/Lymphatic: Negative Reproductive: Prostatectomy.  No pelvic mass Other: Negative for abscess Musculoskeletal: Mild degenerative changes in the lumbar spine. No acute skeletal abnormality. IMPRESSION: Markedly distended urinary bladder. Prior prostatectomy for cancer. No mass lesion in the pelvis. Negative for abscess or source of infection. Electronically Signed   By: Franchot Gallo M.D.   On: 03/28/2019 16:53   Dg Chest 1 View  Result Date: 04/05/2019 CLINICAL DATA:  Fever EXAM: CHEST  1 VIEW COMPARISON:  None. FINDINGS:  Lungs are clear.  No pleural effusion or pneumothorax. The heart is normal in size.  Postsurgical changes related to prior CABG. Median sternotomy. IMPRESSION: No evidence of acute cardiopulmonary disease. Electronically Signed   By: [name] M.D.   On: 04/05/2019 09:39   Dg Chest 1 View  Result Date: 03/30/2019 CLINICAL DATA:  Fever. EXAM: CHEST  1 VIEW COMPARISON:  March 26, 2019. FINDINGS: The heart size and mediastinal contours are within normal limits. Both lungs are clear. The visualized skeletal structures are unremarkable. IMPRESSION: No active disease. Electronically Signed   By: Marijo Conception M.D.   On: 03/30/2019 08:47   Dg Chest 1 View  Result Date: 03/26/2019 CLINICAL DATA:  UNWITNESSED FALL EXAM: CHEST  1 VIEW COMPARISON:  January 07, 2018 FINDINGS: There is mild cardiomegaly. Overlying median sternotomy wires. Both lungs are clear. 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 IMPRESSION: No acute cardiopulmonary process. Electronically Signed   By: Prudencio Pair M.D.   On: 03/26/2019 21:03   Dg Lumbar Spine 2-3 Views  Result Date: 03/28/2019 CLINICAL DATA:  Recent fall at home. Pt does not remember falling. No known previous injury or surgery to pelvis or lumbar spine. EXAM: LUMBAR SPINE - 2-3 VIEW COMPARISON:  None. FINDINGS: There is no evidence of lumbar spine fracture. Alignment is normal. Intervertebral disc spaces are maintained. Flowing anterior osteophytosis. Gaseous distension of bowel loops throughout the abdomen. No acute finding in the visualized pelvis. SI joints are open. IMPRESSION: No acute osseous abnormality in the lumbar spine Electronically Signed   By: Audie Pinto M.D.   On: 03/28/2019 17:05   Dg Pelvis 1-2 Views  Result Date: 03/28/2019 CLINICAL DATA:  Recent fall at home. Pt does not remember falling. No known previous injury or surgery to pelvis or lumbar spine. EXAM: PELVIS - 1-2 VIEW COMPARISON:  Pelvis radiographs 03/23/2010 FINDINGS: There is no evidence of pelvic fracture or diastasis. Hips appear normally aligned. No focal bony  lesion. Bilateral postoperative clips. No pelvic bone lesions are seen. Regional soft tissues unremarkable. IMPRESSION: Negative pelvis radiographs. Electronically Signed   By: Audie Pinto M.D.   On: 03/28/2019 17:07   Dg Shoulder Right  Result Date: 03/30/2019 CLINICAL DATA:  Right shoulder pain without trauma. EXAM: RIGHT SHOULDER - 2+ VIEW COMPARISON:  06/05/2018 FINDINGS: No acute fracture or dislocation. Visualized portion of the right hemithorax is normal. Median sternotomy. Mild degradation secondary to positioning. IMPRESSION: No acute osseous abnormality. Electronically Signed   By: Abigail Miyamoto M.D.   On: 03/30/2019 14:55   Ct Head Wo Contrast  Result Date: 03/26/2019 CLINICAL DATA:  Head trauma, unwitnessed fall EXAM: CT HEAD WITHOUT CONTRAST TECHNIQUE: Contiguous axial images were obtained from the base of the skull through the vertex without intravenous contrast. COMPARISON:  February 25, 2019 FINDINGS: Brain: No evidence of acute territorial infarction, hemorrhage, hydrocephalus,extra-axial collection or mass lesion/mass effect. There is mild dilatation the ventricles and sulci consistent with age-related atrophy. Low-attenuation changes in the deep white matter consistent with small vessel ischemia. Vascular: No hyperdense vessel or unexpected calcification. Skull: The skull is intact. No fracture or focal lesion identified. Sinuses/Orbits: The visualized paranasal sinuses and mastoid air cells are clear. The orbits and globes intact. Other: None IMPRESSION: No acute intracranial abnormality. Findings consistent with age related atrophy and chronic small vessel ischemia Electronically Signed   By: Prudencio Pair M.D.   On: 03/26/2019 22:36   Dg Abd Portable 1v  Result Date: 04/03/2019 CLINICAL DATA:  73 year old male  with gastric distension. EXAM: PORTABLE ABDOMEN - 1 VIEW COMPARISON:  CT Abdomen and Pelvis 03/28/2019. FINDINGS: Supine view at 2345 hours. Mildly rotated to the right.  Stable visualized osseous structures. Stable small surgical clips in the pelvis. Non obstructed bowel gas pattern. There is some retained oral contrast in nondilated large bowel including the sigmoid colon. No definite pneumoperitoneum on this supine view. IMPRESSION: Nonobstructed bowel-gas pattern with some retained oral contrast in the distal large bowel. Electronically Signed   By: Genevie Ann M.D.   On: 04/03/2019 00:03   Ct Bone Marrow Biopsy & Aspiration  Result Date: 04/01/2019 INDICATION: 73 year old male with pancytopenia EXAM: CT GUIDED BONE MARROW ASPIRATION AND CORE BIOPSY Interventional Radiologist:  Criselda Peaches, MD MEDICATIONS: None. ANESTHESIA/SEDATION: Moderate (conscious) sedation was employed during this procedure. A total of 2 milligrams versed and 75 micrograms fentanyl were administered intravenously. The patient's level of consciousness and vital signs were monitored continuously by radiology nursing throughout the procedure under my direct supervision. Total monitored sedation time: 10 minutes FLUOROSCOPY TIME:  None COMPLICATIONS: None immediate. Estimated blood loss: <25 mL PROCEDURE: Informed written consent was obtained from the patient after a thorough discussion of the procedural risks, benefits and alternatives. All questions were addressed. Maximal Sterile Barrier Technique was utilized including caps, mask, sterile gowns, sterile gloves, sterile drape, hand hygiene and skin antiseptic. A timeout was performed prior to the initiation of the procedure. The patient was positioned prone and non-contrast localization CT was performed of the pelvis to demonstrate the iliac marrow spaces. Maximal barrier sterile technique utilized including caps, mask, sterile gowns, sterile gloves, large sterile drape, hand hygiene, and betadine prep. Under sterile conditions and local anesthesia, an 11 gauge coaxial bone biopsy needle was advanced into the right iliac marrow space. Needle  position was confirmed with CT imaging. Initially, bone marrow aspiration was performed. Next, the 11 gauge outer cannula was utilized to obtain a right iliac bone marrow core biopsy. Needle was removed. Hemostasis was obtained with compression. The patient tolerated the procedure well. Samples were prepared with the cytotechnologist. IMPRESSION: Technically successful CT-guided bone marrow aspiration and core biopsy of the right iliac bone. Electronically Signed   By: Jacqulynn Cadet M.D.   On: 04/01/2019 15:53     Assessment and plan- Patient is a 73 y.o. 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 Fever- patient continues to have intermittent episodes of fever. No infectious cause found. He is off antibiotics. While aml can cause fever, given his mental status change- MRI brain/ LP may be conisdered if patients family agreeable  2. Pancytopenia- improved over last 3 days. However BM biopsy shows AML which I have confirmed again with pathology Dr. Melina Copa over the phone especially given that his counts are improving. Again discussed my concerns starting inpatient chemo. Patients present PS is 4. He does not get OOB and is confused. I do not think he will benefit from [place] inpatient. Hospice remains a reasonable option to consider. Patients sisters are yet to come to a decision. Recommend palliative care consult to assist with goals of care.    Visit Diagnosis 1. Fall, initial encounter   2. Fever   3. Altered mental status, unspecified altered mental status type   4. Sepsis, due to unspecified organism, unspecified whether acute organ dysfunction present ([place])   5. Fall   6. Pain   7. Pancytopenia ([place])   8. Gastric distention      Dr. Randa Evens,  MD, MPH [place] at [facility] [phone] 04/05/2019 9:44 PM

## 2019-04-06 LAB — GLUCOSE, CAPILLARY
Glucose-Capillary: 306 mg/dL — ABNORMAL HIGH (ref 70–99)
Glucose-Capillary: 331 mg/dL — ABNORMAL HIGH (ref 70–99)
Glucose-Capillary: 371 mg/dL — ABNORMAL HIGH (ref 70–99)
Glucose-Capillary: 322 mg/dL — ABNORMAL HIGH (ref 70–99)

## 2019-04-06 LAB — CBC
HCT: 29.7 % — ABNORMAL LOW (ref 39.0–52.0)
Hemoglobin: 9.6 g/dL — ABNORMAL LOW (ref 13.0–17.0)
MCH: 24 pg — ABNORMAL LOW (ref 26.0–34.0)
MCHC: 32.3 g/dL (ref 30.0–36.0)
MCV: 74.3 fL — ABNORMAL LOW (ref 80.0–100.0)
Platelets: 154 10*3/uL (ref 150–400)
RBC: 4 MIL/uL — ABNORMAL LOW (ref 4.22–5.81)
RDW: 22.2 % — ABNORMAL HIGH (ref 11.5–15.5)
WBC: 5.6 10*3/uL (ref 4.0–10.5)
nRBC: 1.1 % — ABNORMAL HIGH (ref 0.0–0.2)

## 2019-04-06 LAB — BASIC METABOLIC PANEL
Anion gap: 11 (ref 5–15)
BUN: 22 mg/dL (ref 8–23)
CO2: 30 mmol/L (ref 22–32)
Calcium: 7.9 mg/dL — ABNORMAL LOW (ref 8.9–10.3)
Chloride: 94 mmol/L — ABNORMAL LOW (ref 98–111)
Creatinine, Ser: 0.98 mg/dL (ref 0.61–1.24)
GFR calc Af Amer: >60 mL/min (ref >60)
GFR calc non Af Amer: >60 mL/min (ref >60)
Glucose, Bld: 299 mg/dL — ABNORMAL HIGH (ref 70–99)
Potassium: 3.3 mmol/L — ABNORMAL LOW (ref 3.5–5.1)
Sodium: 135 mmol/L (ref 135–145)

## 2019-04-06 MED ORDER — ONDANSETRON HCL 4 MG PO TABS
4.0000 mg | ORAL_TABLET | Freq: Four times a day (QID) | ORAL | Status: DC | PRN
  Administered 2019-04-06: 13:00:00 4 mg via ORAL
  Filled 2019-04-06: qty 1

## 2019-04-06 MED ORDER — POTASSIUM CHLORIDE CRYS ER 20 MEQ PO TBCR
40.0000 meq | EXTENDED_RELEASE_TABLET | Freq: Once | ORAL | Status: AC
  Administered 2019-04-06: 40 meq via ORAL
  Filled 2019-04-06: qty 2

## 2019-04-06 NOTE — Progress Notes (Signed)
More alert and able to carry on conversation this morning; however, pt is more lethargic and slow to respond this afternoon. Drinking supplement/oral's- declined food so far today. Tylenol # 3 given for generalized aching effective. Zofran effective for nausea. Sister, Shanda Howells, at bedside today. IV antibiotic therapy continued.

## 2019-04-06 NOTE — Progress Notes (Signed)
Family Meeting Note  Advance Directive:no  Today a meeting took place with the Patient sister who is power of attorney.  Patient is unable to participate due [id number] capacity Confusion   The following clinical team members were present during this meeting:MD  The following were discussed:Patient's diagnosis patient with ongoing fevers due to AML.  Patient with poor overall prognosis.  Not a candidate for treatment for AML due to current performance level.: , Patient's progosis: < 6 months and Goals for treatment: DNR  Additional follow-up to be provided: Patient is DNR.  Patient will be discharged to skilled nursing facility with hospice care as per sister's wishes.  Time spent during discussion:25 minutes  Bettey Costa, MD

## 2019-04-06 NOTE — Consult Note (Signed)
[facility] Nurse wound consult note Reason for Consult: Consult received for patient who will discharge to SNF with Hospice services, altered mental status, seizures, HTN, hyperlipidemia, history of prostate cancer, pancytopenia.  Pressure injuries on bilateral buttocks,currently partial thickness ares and as such could be categorized as a Stage 2, but with darker pigmentation (maroon color) might also become a deep tissue pressure injury over time. Wound type: Pressure Pressure Injury POA: Yes Measurement: Nursing to measure and document on Flow Sheet today Wound bed: partial thickness skin loss with deep red/maroon discoloration Drainage (amount, consistency, odor) scant serous Periwound:intact, dry Dressing procedure/placement/frequency: I have placed an order for a mattress replacement with low air loss feature, turning side to side is in place as are bilateral buttock silicone foam dressings. I have added bilateral pressure redistribution heel boots to prevent pressure injuries to heels.  [place] nursing team will not follow, but will remain available to this patient, the nursing and medical teams.  Please re-consult if needed. Thanks, Maudie Flakes, MSN, RN, [place], [facility]  Pager# [phone]

## 2019-04-06 NOTE — Progress Notes (Signed)
[facility] NAME: Mike [name]    MR#:  [id number]  DATE OF BIRTH:  1946-02-07  SUBJECTIVE:  Patient continues to have fevers.  Sister has decided on DNR status and discharge to skilled nursing facility with hospice tomorrow.  Remains confused REVIEW OF SYSTEMS:    Patient not reliable   Tolerating Diet: yes      DRUG ALLERGIES:   Allergies  Allergen Reactions  . Naproxen Sodium Other (See Comments)    Reaction: Upset stomach  . Nsaids Other (See Comments)    Other reaction(s): Unknown  . Aleve [Naproxen] Nausea And Vomiting  . Ibuprofen Nausea Only    Reaction: Upset stomach     VITALS:  Blood pressure 131/75, pulse (!) 102, temperature 97.8 F (36.6 C), temperature source Oral, resp. rate 20, height 5\' 7"  (1.702 m), weight 90 kg, SpO2 98 %.  PHYSICAL EXAMINATION:  Constitutional: Appears well-developed and well-nourished. No distress. HENT: Normocephalic. Marland Kitchen Oropharynx is clear and moist.  Eyes: Conjunctivae and EOM are normal. PERRLA, no scleral icterus.  Neck: Normal ROM. Neck supple. No JVD. No tracheal deviation. CVS: RRR, S1/S2 +, no murmurs, no gallops, no carotid bruit.  Pulmonary: Effort and breath sounds normal, no stridor, rhonchi, wheezes, rales.  Abdominal: Soft. BS +,  no distension, tenderness, rebound or guarding.  Musculoskeletal: Normal range of motion. No edema and no tenderness. Left arm swelling better Neuro: Alert. CN 2-12 grossly intact. No focal deficits. Skin: Skin is warm and dry. No rash noted. Psychiatric: Confused.      LABORATORY PANEL:   CBC Recent Labs  Lab 04/06/19 0449  WBC 5.6  HGB 9.6*  HCT 29.7*  PLT 154   ------------------------------------------------------------------------------------------------------------------  Chemistries  Recent Labs  Lab 04/02/19 0648  04/06/19 0449  NA 136   < > 135  K 3.4*   < > 3.3*  CL 99   < > 94*  CO2 21*   < > 30   GLUCOSE 173*   < > 299*  BUN 13   < > 22  CREATININE 0.87   < > 0.98  CALCIUM 8.1*   < > 7.9*  MG 1.8  --   --    < > = values in this interval not displayed.   ------------------------------------------------------------------------------------------------------------------  Cardiac Enzymes No results for input(s): TROPONINI in the last 168 hours. ------------------------------------------------------------------------------------------------------------------  RADIOLOGY:  Dg Chest 1 View  Result Date: 04/05/2019 CLINICAL DATA:  Fever EXAM: CHEST  1 VIEW COMPARISON:  None. FINDINGS: Lungs are clear.  No pleural effusion or pneumothorax. The heart is normal in size. Postsurgical changes related to prior CABG. Median sternotomy. IMPRESSION: No evidence of acute cardiopulmonary disease. Electronically Signed   By: [name] M.D.   On: 04/05/2019 09:39     ASSESSMENT AND PLAN:   73 year old male with history of seizures and hypertension who presented to the emergency room due to fever and shortness of breath.  1.  Fever: Initially patient felt to have sepsis due to fever and tachycardia.  ID has evaluated patient and he was not initially on antibiotics.  All cultures have been negative.  His fevers are not due to an acute infection.  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  Sepsis has been ruled out.   He is not a candidate for intensive induction chemotherapy and a bone marrow transplant.  Presently his performance status is poor and he will likely be going to a rehab as  he cannot go back to living alone independently.  He still needs to work with physical therapy.  If his performance status improves at rehab-outpatient-methylating agent such as azathioprine could be considered which needs to be given 7 days consecutively every month has a median survival of about 10 months.  However if his performance status continues to decline then best supportive care/hospice may be appropriate.  Untreated  AML has poor prognosis less than 3 to 6 months.     2.  Pancytopenia: This has improved.  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   3.  Seizure disorder: Due to ongoing fever and pancytopenia it has been recommended to discontinue phenobarbital which was weaned off. He will continue Vimpat  4:Anxiety: Continue Paxil and Xanax  5.  Essential hypertension: Continue Toprol  6.  Diabetes: Continue ADA diet with sliding scale and Lantus as per recommendations by diabetes nurse.  7.  BPH: Continue Flomax  8.  Hypokalemia: Replete  Palliative care consultation placed.  Discussed with sister yesterday.  Patient will be discharged to skilled nursing facility with hospice. Discharge Monday if patient has insurance approval   Management plans discussed with the patient's power of attorney yesterday CODE STATUS: DNR  TOTAL TIME TAKING CARE OF THIS PATIENT: 24 minutes.     POSSIBLE D/C 2-4 days, DEPENDING ON CLINICAL CONDITION.   Bettey Costa M.D on 04/06/2019 at 8:53 AM  Between 7am to 6pm - Pager - [phone] After 6pm go to [email or web address] - password [email or web address] [facility]  Office  [phone]  CC: Primary care physician; Adin Hector, MD  Note: This dictation was prepared with Dragon dictation along with smaller phrase technology. Any transcriptional errors that result from this process are unintentional.

## 2019-04-07 ENCOUNTER — Encounter: Payer: Self-pay | Admitting: Gastroenterology

## 2019-04-07 ENCOUNTER — Inpatient Hospital Stay: Payer: Medicare Other

## 2019-04-07 LAB — GLUCOSE, CAPILLARY
Glucose-Capillary: 246 mg/dL — ABNORMAL HIGH (ref 70–99)
Glucose-Capillary: 392 mg/dL — ABNORMAL HIGH (ref 70–99)
Glucose-Capillary: 348 mg/dL — ABNORMAL HIGH (ref 70–99)
Glucose-Capillary: 207 mg/dL — ABNORMAL HIGH (ref 70–99)

## 2019-04-07 LAB — SARS CORONAVIRUS 2 (TAT 6-24 HRS): SARS Coronavirus 2: NEGATIVE

## 2019-04-07 MED ORDER — SODIUM CHLORIDE 0.9 % IV SOLN
INTRAVENOUS | Status: DC | PRN
  Administered 2019-04-07: 15 mL via INTRAVENOUS

## 2019-04-07 MED ORDER — NAPHAZOLINE-GLYCERIN 0.012-0.2 % OP SOLN
1.0000 [drp] | Freq: Four times a day (QID) | OPHTHALMIC | Status: DC | PRN
  Filled 2019-04-07: qty 15

## 2019-04-07 MED ORDER — INSULIN GLARGINE 100 UNIT/ML ~~LOC~~ SOLN
13.0000 [IU] | Freq: Every day | SUBCUTANEOUS | Status: DC
  Administered 2019-04-08: 13 [IU] via SUBCUTANEOUS
  Filled 2019-04-07: qty 0.13

## 2019-04-07 MED ORDER — INSULIN ASPART 100 UNIT/ML ~~LOC~~ SOLN
3.0000 [IU] | Freq: Three times a day (TID) | SUBCUTANEOUS | Status: DC
  Administered 2019-04-07 – 2019-04-08 (×3): 3 [IU] via SUBCUTANEOUS
  Filled 2019-04-07 (×3): qty 1

## 2019-04-07 MED ORDER — ENOXAPARIN SODIUM 100 MG/ML ~~LOC~~ SOLN
1.0000 mg/kg | Freq: Two times a day (BID) | SUBCUTANEOUS | Status: DC
  Administered 2019-04-07: 21:00:00 90 mg via SUBCUTANEOUS
  Filled 2019-04-07 (×2): qty 1

## 2019-04-07 NOTE — Progress Notes (Signed)
Ch receive pg regarding pt to complete an AD. Ch checked in with care team regarding pt who shared that the pt was not mentally able to respond appropriately consistently for the AD completion. Ch still provided AD education but did not move forward with AD being notarized. Sister and pt understood. Pt is DNR and shared that based on his recent diagnosis of leukemia, he would not want any of these life prolonging measures: dialysis, CPR, ventilator, a feeding tube, or any other life prolonging measure. Pt shared that during this hospitalization he has experienced a decrease in appetite. Ch provided words of comfort and provided a compassionate presence. Pt will be d/c to [facility] which he felt comfortable with.  No further needs at this time.    04/07/19 1500  Clinical Encounter Type  Visited With Health care provider;Patient and family together  Visit Type Other (Comment);Spiritual support;Social support (AD education )  Referral From Nurse  Consult/Referral To Chaplain  Spiritual Encounters  Spiritual Needs Grief support;Emotional  Stress Factors  Patient Stress Factors Health changes;Loss of control;Major life changes  Family Stress Factors Major life changes  Advance Directives (For Healthcare)  Does Patient Have a Medical Advance Directive? Unable to assess, patient is non-responsive or altered mental status

## 2019-04-07 NOTE — TOC Progression Note (Addendum)
Transition of Care [facility]) - Progression Note    Patient Details  Name: Mike [name] MRN: [id number] Date of Birth: 08-27-1945  Transition of Care [facility] CM/SW [place], LCSW Phone Number: 04/07/2019, 12:28 PM  Clinical Narrative: COVID results are pending. [facility] is aware of plan for palliative follow up. Palliative is not allowed in the building but can follow by phone. They will order an air mattress per WOC recommendations.  1:20 pm: Updated patient and daughter. Patient wants his daughter to become HCPOA. Asked RN for chaplain consult.  Expected Discharge Plan: [place] Barriers to Discharge: Continued Medical Work up  Expected Discharge Plan and Services Expected Discharge Plan: [place]   Discharge Planning Services: CM Consult Post Acute Care Choice: [place] Living arrangements for the past 2 months: Single Family Home                                       Social Determinants of Health (SDOH) Interventions    Readmission Risk Interventions No flowsheet data found.

## 2019-04-07 NOTE — Progress Notes (Signed)
Made Dr. Brett Albino aware of swelling in patient's left arm.  Patient stated this morning that this is normal for him however, his sister states that it is not.  Patient also complained of pain in his left arm when working with physical therapy.  New orders received from Dr. Brett Albino.  Clarise Cruz, BSN

## 2019-04-07 NOTE — Progress Notes (Signed)
   Date of Admission:  03/26/2019      Subjective: No fever in 48 hours As per sister he is a little bit more clear headed Complaining of left arm pain  Medications:  . ALPRAZolam  0.5-1 mg Oral BID  . aspirin EC  81 mg Oral Daily  . atorvastatin  20 mg Oral Daily  . Chlorhexidine Gluconate Cloth  6 each Topical Daily  . cholecalciferol  1,000 Units Oral Daily  . feeding supplement (NEPRO CARB STEADY)  237 mL Oral BID BM  . furosemide  40 mg Oral Daily  . gabapentin  1,200 mg Oral QHS  . gabapentin  300 mg Oral Daily  . insulin aspart  0-15 Units Subcutaneous TID WC  . insulin aspart  0-5 Units Subcutaneous QHS  . insulin glargine  10 Units Subcutaneous Daily  . ketorolac  7.5 mg Intravenous Once  . lacosamide  100 mg Oral BID  . metoprolol succinate  50 mg Oral BID  . multivitamin with minerals  1 tablet Oral Daily  . omega-3 acid ethyl esters  1,000 mg Oral Daily  . pantoprazole  40 mg Oral Daily  . PARoxetine  20 mg Oral Daily  . sodium chloride flush  10-40 mL Intracatheter Q12H  . tamsulosin  0.4 mg Oral Daily  . vitamin B-12  2,000 mcg Oral Daily    Objective: Vital signs in last 24 hours: Temp:  [97.7 F (36.5 C)-98.6 F (37 C)] 97.7 F (36.5 C) (10/19 0909) Pulse Rate:  [71-112] 71 (10/19 0909) Resp:  [17-22] 17 (10/19 0909) BP: (105-127)/(51-102) 118/72 (10/19 0909) SpO2:  [94 %-99 %] 97 % (10/19 0909)  PHYSICAL EXAM:  Unable to examine the patient because of him being not cooperative  Lab Results Recent Labs    04/05/19 0632 04/06/19 0449  WBC 7.1 5.6  HGB 10.0* 9.6*  HCT 30.9* 29.7*  NA  --  135  K  --  3.3*  CL  --  94*  CO2  --  30  BUN  --  22  CREATININE  --  0.98   Liver Panel No results for input(s): PROT, ALBUMIN, AST, ALT, ALKPHOS, BILITOT, BILIDIR, IBILI in the last 72 hours. Sedimentation Rate No results for input(s): ESRSEDRATE in the last 72 hours. C-Reactive Protein No results for input(s): CRP in the last 72  hours.  Microbiology:  Studies/Results: No results found.   Assessment/Plan: AML: Followed by oncologist who does not think he is not a candidate for chemotherapy currently because of his underlying functional status.  He has been referred to physical therapy rehab and if he improves then he will go on chemo.  Unexplained fever: Malignancy versus infection versus phenobarbital As he was having proctodynia other Unasyn was started last week.  Afebrile in 48 hours.  Continue Unasyn or Augmentin until 04/09/2019.  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  Warmly small skin pressure ulcer noted  Pancytopenia: Combination of phenobarbital and AML.  Much improved  Seizure disorder was on phenobarbital which has been tapered and stopped.  He is currently on Vimpat.  Discussed with sister.  ID will sign off call if needed.

## 2019-04-07 NOTE — Consult Note (Signed)
[place] for Enoxaparin Indication: VTE treatment   Allergies  Allergen Reactions  . Naproxen Sodium Other (See Comments)    Reaction: Upset stomach  . Nsaids Other (See Comments)    Other reaction(s): Unknown  . Aleve [Naproxen] Nausea And Vomiting  . Ibuprofen Nausea Only    Reaction: Upset stomach     Patient Measurements: Height: 5\' 7"  (170.2 cm) Weight: 198 lb 6.6 oz (90 kg) IBW/kg (Calculated) : 66.1 Heparin Dosing Weight: 90 kg   Vital Signs: Temp: 98.4 F (36.9 C) (10/19 1717) Temp Source: Oral (10/19 1717) BP: 128/67 (10/19 1717) Pulse Rate: 101 (10/19 1717)  Labs: Recent Labs    04/05/19 0632 04/06/19 0449  HGB 10.0* 9.6*  HCT 30.9* 29.7*  PLT 152 154  CREATININE  --  0.98    Estimated Creatinine Clearance: 70.8 mL/min (by C-G formula based on SCr of 0.98 mg/dL).   Medications:  No PTA anticoagulants  No prior anticoagulants scheduled during this admission.  Assessment: Baseline hemoglobin down and platelets WNL.    Goal of Therapy:   Monitor platelets by anticoagulation protocol: Yes   Plan:  Will order enoxaparin 1 mg/kg Q12H. Will follow CBC with AM labs.   Rowland Lathe 04/07/2019,8:24 PM
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
## 2019-04-07 NOTE — Plan of Care (Signed)
PMT note: Spoke with Dr. Brett Albino. Consult cancelled. [place] set.

## 2019-04-07 NOTE — Progress Notes (Signed)
Inpatient Diabetes Program Recommendations  AACE/ADA: New Consensus Statement on Inpatient Glycemic Control   Target Ranges:  Prepandial:   less than 140 mg/dL      Peak postprandial:   less than 180 mg/dL (1-2 hours)      Critically ill patients:  140 - 180 mg/dL  Results for Mike [name], Mike [name] (MRN [id number]) as of 04/07/2019 13:19  Ref. Range 04/06/2019 08:05 04/06/2019 12:09 04/06/2019 16:28 04/06/2019 21:21 04/07/2019 08:14 04/07/2019 11:53  Glucose-Capillary Latest Ref Range: 70 - 99 mg/dL 306 (H) 331 (H) 371 (H) 322 (H) 246 (H) 392 (H)    Review of Glycemic Control  Diabetes history: DM2 Outpatient Diabetes medications: Amaryl 4 mg QAM, Amaryl 2 mg QPM, Actos 30 mg daily Current orders for Inpatient glycemic control: Lantus 10 units daily, Novolog 0-15 units TID with meals, Novolog 0-5 units QHS  Inpatient Diabetes Program Recommendations:   Insulin-Basal: Please consider increasing Lantus to 13 units daily.  Insulin-Meal Coverage: Please consider ordering Novolog 3 units TID with meals for meal coverage if patient eats at least 50% of meals.  Thanks, Barnie Alderman, RN, MSN, CDE Diabetes Coordinator Inpatient Diabetes Program [phone] (Team Pager from 8am to 5pm)

## 2019-04-07 NOTE — Progress Notes (Addendum)
[place] [facility] [place] NAME: Yisroel Hamacher    MR#:  [id number]  DATE OF BIRTH:  28-Apr-1946  SUBJECTIVE:   Patient states he is doing well today.  He is looking forward to leaving the hospital.  He has not had any fevers in over 24 hours.  He endorses some overall weakness, but no other concerns today.  REVIEW OF SYSTEMS:    Review of Systems  Constitutional: Positive for malaise/fatigue. Negative for chills and fever.  HENT: Negative for congestion and sore throat.   Eyes: Negative for blurred vision and double vision.  Respiratory: Negative for cough and shortness of breath.   Cardiovascular: Negative for chest pain and palpitations.  Gastrointestinal: Negative for nausea and vomiting.  Genitourinary: Negative for dysuria and urgency.  Musculoskeletal: Negative for back pain and neck pain.  Neurological: Negative for dizziness and headaches.  Psychiatric/Behavioral: Negative for depression. The patient is not nervous/anxious.     DRUG ALLERGIES:   Allergies  Allergen Reactions  . Naproxen Sodium Other (See Comments)    Reaction: Upset stomach  . Nsaids Other (See Comments)    Other reaction(s): Unknown  . Aleve [Naproxen] Nausea And Vomiting  . Ibuprofen Nausea Only    Reaction: Upset stomach     VITALS:  Blood pressure 118/72, pulse 71, temperature 97.7 F (36.5 C), temperature source Oral, resp. rate 17, height 5\' 7"  (1.702 m), weight 90 kg, SpO2 97 %.  PHYSICAL EXAMINATION:  Constitutional: Laying in bed, in NAD, well-appearing. HENT: Normocephalic. Atraumatic. Oropharynx is clear and moist.  Eyes: Conjunctivae and EOM are normal. PERRLA, no scleral icterus.  Neck: Normal ROM. Neck supple. No JVD. No tracheal deviation. CVS: RRR, S1/S2, no murmurs, no gallops, no carotid bruit.  Pulmonary: Effort and breath sounds normal, no stridor, rhonchi, wheezes, rales.  Abdominal: Soft. BS +,  no distension, tenderness, rebound or  guarding.  Musculoskeletal: Normal range of motion. No edema and no tenderness. Neuro: Alert. CN 2-12 grossly intact. No focal deficits. Skin: Skin is warm and dry. No rash noted. Psychiatric: Mildly confused but answering questions appropriately.  LABORATORY PANEL:   CBC Recent Labs  Lab 04/06/19 0449  WBC 5.6  HGB 9.6*  HCT 29.7*  PLT 154   ------------------------------------------------------------------------------------------------------------------  Chemistries  Recent Labs  Lab 04/02/19 0648  04/06/19 0449  NA 136   < > 135  K 3.4*   < > 3.3*  CL 99   < > 94*  CO2 21*   < > 30  GLUCOSE 173*   < > 299*  BUN 13   < > 22  CREATININE 0.87   < > 0.98  CALCIUM 8.1*   < > 7.9*  MG 1.8  --   --    < > = values in this interval not displayed.   ------------------------------------------------------------------------------------------------------------------  Cardiac Enzymes No results for input(s): TROPONINI in the last 168 hours. ------------------------------------------------------------------------------------------------------------------  RADIOLOGY:  No results found.   ASSESSMENT AND PLAN:   Fever- likely secondary to AML.  Initially meeting sepsis criteria with fever and tachycardia, but sepsis has been ruled out. -Blood and urine cultures x2 with no growth -ID following -Patient is currently on Unasyn  AML-new diagnosis -Seen by oncology, who did not feel that he was a candidate for chemotherapy and bone marrow transplant at this time -Can follow-up with oncology as an outpatient  Pancytopenia-improving.  Likely due to AML and phenobarbital. -Monitor  Seizure disorder- no seizure-like episodes here -  Phenobarbital has been weaned and patient has been started on Vimpat  Rectal pain-resolved.  Likely due to perianal ulceration and erythema. -Patient underwent flex sig on 10/16, which was normal other than the perianal findings -Follow-up with GI in  3-4 weeks  Chronic anxiety-stable -Continue home Paxil and Xanax  Hypertension-stable -Continue home metoprolol  Type 2 diabetes-stable -Continue Lantus and SSI  Stage II pressure ulcers on the bilateral buttocks- present on admission -Wound RN recommended low air loss mattress, frequent turns, bilateral buttock silicone foam dressings, and bilateral pressure redistribution heel boots  Acute urinary retention- foley placed 10/12 -Urology (Dr. Jeffie Pollock) was consulted and recommended outpatient voiding trial  Plan is for discharge to SNF with outpatient palliative care, likely tomorrow.  Discussed with patient and sister, [name], at bedside. Repeat COVID has been ordered.  CODE STATUS: DNR  TOTAL TIME TAKING CARE OF THIS PATIENT: 40 minutes.   POSSIBLE D/C tomorrow, DEPENDING ON CLINICAL CONDITION.   Berna Spare Donnovan Stamour M.D on 04/07/2019 at 12:29 PM  Between 7am to 6pm - Pager - [phone]  After 6pm go to [email or web address] - password [email or web address] [facility]  Office  [phone]  CC: Primary care physician; Adin Hector, MD  Note: This dictation was prepared with Dragon dictation along with smaller phrase technology. Any transcriptional errors that result from this process are unintentional.

## 2019-04-08 ENCOUNTER — Encounter (HOSPITAL_COMMUNITY): Payer: Self-pay | Admitting: Oncology

## 2019-04-08 LAB — CBC
HCT: 26 % — ABNORMAL LOW (ref 39.0–52.0)
Hemoglobin: 8.3 g/dL — ABNORMAL LOW (ref 13.0–17.0)
MCH: 24.1 pg — ABNORMAL LOW (ref 26.0–34.0)
MCHC: 31.9 g/dL (ref 30.0–36.0)
MCV: 75.6 fL — ABNORMAL LOW (ref 80.0–100.0)
Platelets: 149 10*3/uL — ABNORMAL LOW (ref 150–400)
RBC: 3.44 MIL/uL — ABNORMAL LOW (ref 4.22–5.81)
RDW: 22.4 % — ABNORMAL HIGH (ref 11.5–15.5)
WBC: 3.4 10*3/uL — ABNORMAL LOW (ref 4.0–10.5)
nRBC: 0.6 % — ABNORMAL HIGH (ref 0.0–0.2)

## 2019-04-08 LAB — MAGNESIUM: Magnesium: 2 mg/dL (ref 1.7–2.4)

## 2019-04-08 LAB — GLUCOSE, CAPILLARY
Glucose-Capillary: 232 mg/dL — ABNORMAL HIGH (ref 70–99)
Glucose-Capillary: 249 mg/dL — ABNORMAL HIGH (ref 70–99)
Glucose-Capillary: 179 mg/dL — ABNORMAL HIGH (ref 70–99)
Glucose-Capillary: 148 mg/dL — ABNORMAL HIGH (ref 70–99)

## 2019-04-08 LAB — BASIC METABOLIC PANEL
Anion gap: 10 (ref 5–15)
BUN: 21 mg/dL (ref 8–23)
CO2: 31 mmol/L (ref 22–32)
Calcium: 7.4 mg/dL — ABNORMAL LOW (ref 8.9–10.3)
Chloride: 98 mmol/L (ref 98–111)
Creatinine, Ser: 0.77 mg/dL (ref 0.61–1.24)
GFR calc Af Amer: >60 mL/min (ref >60)
GFR calc non Af Amer: >60 mL/min (ref >60)
Glucose, Bld: 179 mg/dL — ABNORMAL HIGH (ref 70–99)
Potassium: 2.9 mmol/L — ABNORMAL LOW (ref 3.5–5.1)
Sodium: 139 mmol/L (ref 135–145)

## 2019-04-08 LAB — SURGICAL PATHOLOGY

## 2019-04-08 MED ORDER — INSULIN GLARGINE 100 UNIT/ML ~~LOC~~ SOLN
16.0000 [IU] | Freq: Every day | SUBCUTANEOUS | Status: DC
  Administered 2019-04-09: 16 [IU] via SUBCUTANEOUS
  Filled 2019-04-08 (×2): qty 0.16

## 2019-04-08 MED ORDER — APIXABAN 5 MG PO TABS: 5.0000 mg | ORAL_TABLET | Freq: Two times a day (BID) | ORAL | Status: DC

## 2019-04-08 MED ORDER — AMOXICILLIN-POT CLAVULANATE 875-125 MG PO TABS
1.0000 | ORAL_TABLET | Freq: Two times a day (BID) | ORAL | Status: DC
  Administered 2019-04-08 – 2019-04-09 (×2): 1 via ORAL
  Filled 2019-04-08 (×2): qty 1

## 2019-04-08 MED ORDER — AMOXICILLIN-POT CLAVULANATE 875-125 MG PO TABS: 1.0000 | ORAL_TABLET | Freq: Two times a day (BID) | ORAL | Status: DC

## 2019-04-08 MED ORDER — SODIUM CHLORIDE 0.9 % IV SOLN
510.0000 mg | Freq: Once | INTRAVENOUS | Status: AC
  Administered 2019-04-08: 510 mg via INTRAVENOUS
  Filled 2019-04-08: qty 17

## 2019-04-08 MED ORDER — LIDOCAINE VISCOUS HCL 2 % MT SOLN
15.0000 mL | Freq: Four times a day (QID) | OROMUCOSAL | Status: DC
  Administered 2019-04-08 – 2019-04-09 (×3): 15 mL via OROMUCOSAL
  Filled 2019-04-08 (×4): qty 15

## 2019-04-08 MED ORDER — NAPHAZOLINE-GLYCERIN 0.012-0.2 % OP SOLN
2.0000 [drp] | Freq: Every day | OPHTHALMIC | Status: DC
  Administered 2019-04-08 – 2019-04-09 (×2): 2 [drp] via OPHTHALMIC
  Filled 2019-04-08: qty 15

## 2019-04-08 MED ORDER — POTASSIUM CHLORIDE CRYS ER 20 MEQ PO TBCR
40.0000 meq | EXTENDED_RELEASE_TABLET | ORAL | Status: AC
  Administered 2019-04-08 (×2): 40 meq via ORAL
  Filled 2019-04-08 (×2): qty 2

## 2019-04-08 MED ORDER — HYDROXYZINE HCL 10 MG PO TABS
10.0000 mg | ORAL_TABLET | Freq: Three times a day (TID) | ORAL | Status: DC | PRN
  Filled 2019-04-08: qty 1

## 2019-04-08 MED ORDER — INSULIN ASPART 100 UNIT/ML ~~LOC~~ SOLN
5.0000 [IU] | Freq: Three times a day (TID) | SUBCUTANEOUS | Status: DC
  Administered 2019-04-08 – 2019-04-09 (×3): 5 [IU] via SUBCUTANEOUS
  Filled 2019-04-08 (×3): qty 1

## 2019-04-08 MED ORDER — APIXABAN 5 MG PO TABS
10.0000 mg | ORAL_TABLET | Freq: Two times a day (BID) | ORAL | Status: DC
  Administered 2019-04-08 – 2019-04-09 (×3): 10 mg via ORAL
  Filled 2019-04-08 (×3): qty 2

## 2019-04-08 NOTE — Progress Notes (Addendum)
[place] [facility] [place] NAME: Mike [name]    MR#:  [id number]  DATE OF BIRTH:  [birth year]  SUBJECTIVE:   Patient developed some left upper extremity edema yesterday evening.  Doppler ultrasound showed a left upper extremity DVT.  Patient states his swelling is much better today.  He denies any bleeding.  REVIEW OF SYSTEMS:    Review of Systems  Constitutional: Positive for malaise/fatigue. Negative for chills and fever.  HENT: Negative for congestion and sore throat.   Eyes: Negative for blurred vision and double vision.  Respiratory: Negative for cough and shortness of breath.   Cardiovascular: Negative for chest pain and palpitations.  Gastrointestinal: Negative for nausea and vomiting.  Genitourinary: Negative for dysuria and urgency.  Musculoskeletal: Negative for back pain and neck pain.  Neurological: Negative for dizziness and headaches.  Psychiatric/Behavioral: Negative for depression. The patient is not nervous/anxious.     DRUG ALLERGIES:   Allergies  Allergen Reactions  . Naproxen Sodium Other (See Comments)    Reaction: Upset stomach  . Nsaids Other (See Comments)    Other reaction(s): Unknown  . Aleve [Naproxen] Nausea And Vomiting  . Ibuprofen Nausea Only    Reaction: Upset stomach     VITALS:  Blood pressure 119/77, pulse (!) 105, temperature 98.7 F (37.1 C), temperature source Oral, resp. rate 18, height 5\' 7"  (1.702 m), weight 90 kg, SpO2 97 %.  PHYSICAL EXAMINATION:  Constitutional: Laying in bed, in NAD, well-appearing. HENT: Normocephalic. Atraumatic. Oropharynx is clear and moist.  Eyes: Conjunctivae and EOM are normal. PERRLA, no scleral icterus.  Neck: Normal ROM. Neck supple. No JVD. No tracheal deviation. CVS: RRR, S1/S2, no murmurs, no gallops, no carotid bruit.  Pulmonary: Effort and breath sounds normal, no stridor, rhonchi, wheezes, rales.  Abdominal: Soft. BS +,  no distension, tenderness, rebound  or guarding.  Musculoskeletal: Normal range of motion. + Left upper extremity is mildly swollen.  No erythema. Neuro: Alert. CN 2-12 grossly intact. No focal deficits. Skin: Skin is warm and dry. No rash noted. Psychiatric: Calm. Answering questions appropriately.  LABORATORY PANEL:   CBC Recent Labs  Lab 04/08/19 0500  WBC 3.4*  HGB 8.3*  HCT 26.0*  PLT 149*   ------------------------------------------------------------------------------------------------------------------  Chemistries  Recent Labs  Lab 04/08/19 0500  NA 139  K 2.9*  CL 98  CO2 31  GLUCOSE 179*  BUN 21  CREATININE 0.77  CALCIUM 7.4*  MG 2.0   ------------------------------------------------------------------------------------------------------------------  Cardiac Enzymes No results for input(s): TROPONINI in the last 168 hours. ------------------------------------------------------------------------------------------------------------------  RADIOLOGY:  US Venous Img Upper Uni Left  Result Date: 04/07/2019 CLINICAL DATA:  73 year old male with left arm swelling EXAM: LEFT UPPER EXTREMITY VENOUS DOPPLER ULTRASOUND TECHNIQUE: Gray-scale sonography with graded compression, as well as color Doppler and duplex ultrasound were performed to evaluate the upper extremity deep venous system from the level of the subclavian vein and including the jugular, axillary, basilic, radial, ulnar and upper cephalic vein. Spectral Doppler was utilized to evaluate flow at rest and with distal augmentation maneuvers. COMPARISON:  None. FINDINGS: Contralateral Subclavian Vein: Respiratory phasicity is normal and symmetric with the symptomatic side. No evidence of thrombus. Normal compressibility. Internal Jugular Vein: No evidence of thrombus. Normal compressibility, respiratory phasicity and response to augmentation. Subclavian Vein: No evidence of thrombus. Normal compressibility, respiratory phasicity and response to  augmentation. Axillary Vein: No evidence of thrombus. Normal compressibility, respiratory phasicity and response to augmentation. Cephalic Vein: Noncompressible. Occlusive thrombus  present from the proximal forearm to the proximal humerus. Basilic Vein: Noncompressible. The lumen is filled with low-level internal echoes. Findings are consistent with occlusive thrombus from the proximal forearm to the proximal upper arm. Brachial Veins: Abnormal. One of the paired brachial veins is noncompressible in the lumen is filled with low-level internal echoes. No evidence of color flow on color Doppler imaging. Findings are consistent with acute occlusive deep venous thrombosis. Radial Veins: No evidence of thrombus. Normal compressibility, respiratory phasicity and response to augmentation. Ulnar Veins: No evidence of thrombus. Normal compressibility, respiratory phasicity and response to augmentation. Venous Reflux:  None visualized. Other Findings:  None visualized. IMPRESSION: 1. Positive for acute occlusive deep venous thrombosis involving 1 of the paired brachial veins from the antecubital fossa to the proximal humerus. 2. Positive for acute occlusive superficial venous thrombosis involving the cephalic and basilic veins beginning in the proximal forearm and extending into the proximal upper arm. Electronically Signed   By: Jacqulynn Cadet M.D.   On: 04/07/2019 17:28   Dg Shoulder Left  Result Date: 04/07/2019 CLINICAL DATA:  Left arm swelling. EXAM: LEFT SHOULDER - 2+ VIEW COMPARISON:  None. FINDINGS: No acute fracture or dislocation is identified. Acromioclavicular joint space narrowing and mild marginal spurring are noted. Spurring is also noted along the undersurface of the acromion. The soft tissues are unremarkable. IMPRESSION: 1. No acute osseous abnormality identified. 2. Mild acromioclavicular osteoarthrosis. Electronically Signed   By: Logan Bores M.D.   On: 04/07/2019 17:09     ASSESSMENT AND  PLAN:   Left upper extremity DVT- likely due to hypercoagulable state in the setting of AML. -Left upper extremity duplex ultrasound yesterday was positive for DVT -Given full dose Lovenox last night -Start on Eliquis twice daily today  Fever- likely secondary to AML.  Initially meeting sepsis criteria with fever and tachycardia, but sepsis has been ruled out.  Patient has been afebrile for > 48 hours. -Blood and urine cultures x2 with no growth -ID following -Switch patient from Unasyn to Augmentin today.  Tomorrow will be his last day of antibiotics.  Hypokalemia- magnesium is normal. -Replete and recheck  AML-new diagnosis -Seen by oncology, who did not feel that he was a candidate for chemotherapy and bone marrow transplant at this time -Can follow-up with oncology as an outpatient  Pancytopenia with iron deficiency anemia- worsened from yesterday to todau.  Likely due to AML and phenobarbital. -Will give a dose of IV iron x 1 today -Recheck CBC in the morning  Seizure disorder- no seizure-like episodes here -Phenobarbital has been stopped and patient has been started on Vimpat  Rectal pain-resolved.  Likely due to perianal ulceration and erythema. -Patient underwent flex sig on 10/16, which was normal other than the perianal findings -Follow-up with GI in 3-4 weeks  Chronic anxiety-stable -Continue home Paxil and Xanax  Hypertension-stable -Continue home metoprolol  Type 2 diabetes-stable -Continue Lantus and SSI  Stage II pressure ulcers on the bilateral buttocks- present on admission -Wound RN recommended low air loss mattress, frequent turns, bilateral buttock silicone foam dressings, and bilateral pressure redistribution heel boots  Acute urinary retention- foley placed 10/12 -Urology (Dr. Jeffie Pollock) was consulted and recommended outpatient voiding trial  Sister, [name], was at bedside and was updated on the plan.  Plan for likely discharge to SNF with outpatient  palliative care tomorrow. Repeat COVID test 10/19 was negative.  CODE STATUS: DNR  TOTAL TIME TAKING CARE OF THIS PATIENT: 45 minutes.   POSSIBLE D/C tomorrow,  DEPENDING ON CLINICAL CONDITION.   Berna Spare  M.D on 04/08/2019 at 1:01 PM  Between 7am to 6pm - Pager [phone]  After 6pm go to [email or web address] - password [email or web address] [facility]  Office  [phone]  CC: Primary care physician; Adin Hector, MD  Note: This dictation was prepared with Dragon dictation along with smaller phrase technology. Any transcriptional errors that result from this process are unintentional.

## 2019-04-08 NOTE — Progress Notes (Signed)
Inpatient Diabetes Program Recommendations  AACE/ADA: New Consensus Statement on Inpatient Glycemic Control   Target Ranges:  Prepandial:   less than 140 mg/dL      Peak postprandial:   less than 180 mg/dL (1-2 hours)      Critically ill patients:  140 - 180 mg/dL   Results for KASHAUN, SITZER (MRN [id number]) as of 04/08/2019 13:35  Ref. Range 04/07/2019 08:14 04/07/2019 11:53 04/07/2019 17:13 04/07/2019 21:35 04/08/2019 07:44 04/08/2019 11:58  Glucose-Capillary Latest Ref Range: 70 - 99 mg/dL 246 (H) 392 (H) 348 (H) 207 (H) 232 (H) 249 (H)   Review of Glycemic Control  Diabetes history:DM2 Outpatient Diabetes medications:Amaryl 4 mg QAM, Amaryl 2 mg QPM, Actos 30 mg daily Current orders for Inpatient glycemic control:Lantus 13 units daily, Novolog 0-15 units TID with meals, Novolog 0-5 units QHS, Novolog 3 units TID with meals for meal coverage  Inpatient Diabetes Program Recommendations:   Insulin-Basal: Please consider increasing Lantus to 16 units daily.  Insulin-Meal Coverage: Please consider increasing meal coverage to Novolog 5 units TID with meals if patient eats at least 50% of meals.  Thanks, Barnie Alderman, RN, MSN, CDE Diabetes Coordinator Inpatient Diabetes Program [phone] (Team Pager from 8am to 5pm)

## 2019-04-08 NOTE — Care Management Important Message (Signed)
Important Message  Patient Details  Name: Mike [name] MRN: [id number] Date of Birth: Oct 23, 1945   Medicare Important Message Given:  Yes     Juliann Pulse A Kadelyn Dimascio 04/08/2019, 10:53 AM

## 2019-04-08 NOTE — Progress Notes (Signed)
   04/08/19 0800  HEENT  Mucous Membrane(s) Ulcerations  notified Dr Brett Albino

## 2019-04-08 NOTE — Progress Notes (Signed)
Sister called and wants a call from [place], they would like to transfer pt to [place] once DC.

## 2019-04-08 NOTE — Consult Note (Signed)
[place] for Apixaban dosing Indication: VTE treatment   Allergies  Allergen Reactions  . Naproxen Sodium Other (See Comments)    Reaction: Upset stomach  . Nsaids Other (See Comments)    Other reaction(s): Unknown  . Aleve [Naproxen] Nausea And Vomiting  . Ibuprofen Nausea Only    Reaction: Upset stomach     Patient Measurements: Height: 5\' 7"  (170.2 cm) Weight: 198 lb 6.6 oz (90 kg) IBW/kg (Calculated) : 66.1 Heparin Dosing Weight: 90 kg   Vital Signs: Temp: 98.7 F (37.1 C) (10/20 0818) Temp Source: Oral (10/20 0818) BP: 119/77 (10/20 0818) Pulse Rate: 105 (10/20 0818)  Labs: Recent Labs    04/06/19 0449 04/08/19 0500  HGB 9.6* 8.3*  HCT 29.7* 26.0*  PLT 154 149*  CREATININE 0.98 0.77    Estimated Creatinine Clearance: 86.7 mL/min (by C-G formula based on SCr of 0.77 mg/dL).   Medications:  No PTA anticoagulants  Patient received therapeutic lovenox dose@2049 . .  Assessment: Baseline hemoglobin down and platelets WNL.    Goal of Therapy:   Monitor platelets by anticoagulation protocol: Yes   Plan:  Will order apixaban 10mg  BID x 7 days, followed by 5mg  BID thereafter. Will follow CBC with AM labs.   Mike [name] Mike [name] 04/08/2019,12:11 PM

## 2019-04-08 NOTE — Progress Notes (Signed)
sister is here and would like a visit from dr. Pt has sores in mouth and would like something for dry eyes. Sister request labs to be reviewed with her. notified daughter.

## 2019-04-08 NOTE — TOC Progression Note (Signed)
Transition of Care [facility]) - Progression Note    Patient Details  Name: Mike [name] MRN: [id number] Date of Birth: 08/07/1945  [facility]) CM/SW Contact  Shelbie Hutching, RN Phone Number: 04/08/2019, 4:05 PM  Clinical Narrative:    Patient's sister is requesting that patient be transferred to [facility], she reports that they have a program specifically for patient's with AML that is 30 days.  MD checking to see if [facility] can accept.     Expected Discharge Plan: [place] Barriers to Discharge: Continued Medical Work up  Expected Discharge Plan and Services Expected Discharge Plan: [place]   Discharge Planning Services: CM Consult Post Acute Care Choice: [place] Living arrangements for the past 2 months: Single Family Home                                       Social Determinants of Health (SDOH) Interventions    Readmission Risk Interventions No flowsheet data found.

## 2019-04-09 ENCOUNTER — Other Ambulatory Visit: Payer: Self-pay | Admitting: Internal Medicine

## 2019-04-09 LAB — CBC
HCT: 24.6 % — ABNORMAL LOW (ref 39.0–52.0)
Hemoglobin: 7.8 g/dL — ABNORMAL LOW (ref 13.0–17.0)
MCH: 24.5 pg — ABNORMAL LOW (ref 26.0–34.0)
MCHC: 31.7 g/dL (ref 30.0–36.0)
MCV: 77.1 fL — ABNORMAL LOW (ref 80.0–100.0)
Platelets: 169 10*3/uL (ref 150–400)
RBC: 3.19 MIL/uL — ABNORMAL LOW (ref 4.22–5.81)
RDW: 22.5 % — ABNORMAL HIGH (ref 11.5–15.5)
WBC: 2.9 10*3/uL — ABNORMAL LOW (ref 4.0–10.5)
nRBC: 0 % (ref 0.0–0.2)

## 2019-04-09 LAB — BASIC METABOLIC PANEL
Anion gap: 9 (ref 5–15)
BUN: 18 mg/dL (ref 8–23)
CO2: 30 mmol/L (ref 22–32)
Calcium: 7.8 mg/dL — ABNORMAL LOW (ref 8.9–10.3)
Chloride: 101 mmol/L (ref 98–111)
Creatinine, Ser: 0.73 mg/dL (ref 0.61–1.24)
GFR calc Af Amer: >60 mL/min (ref >60)
GFR calc non Af Amer: >60 mL/min (ref >60)
Glucose, Bld: 166 mg/dL — ABNORMAL HIGH (ref 70–99)
Potassium: 4 mmol/L (ref 3.5–5.1)
Sodium: 140 mmol/L (ref 135–145)

## 2019-04-09 LAB — GLUCOSE, CAPILLARY
Glucose-Capillary: 195 mg/dL — ABNORMAL HIGH (ref 70–99)
Glucose-Capillary: 200 mg/dL — ABNORMAL HIGH (ref 70–99)

## 2019-04-09 MED ORDER — APIXABAN 5 MG PO TABS: ORAL_TABLET | ORAL | 0 refills | Status: AC

## 2019-04-09 MED ORDER — POLYETHYLENE GLYCOL 3350 17 G PO PACK: 17.0000 g | PACK | Freq: Every day | ORAL | 0 refills | Status: AC | PRN

## 2019-04-09 MED ORDER — NEPRO/CARBSTEADY PO LIQD: 237.0000 mL | Freq: Two times a day (BID) | ORAL | 0 refills | Status: DC

## 2019-04-09 MED ORDER — TAMSULOSIN HCL 0.4 MG PO CAPS: 0.4000 mg | ORAL_CAPSULE | Freq: Every day | ORAL | 0 refills | Status: AC

## 2019-04-09 MED ORDER — NAPHAZOLINE-GLYCERIN 0.012-0.2 % OP SOLN: 2.0000 [drp] | Freq: Every day | OPHTHALMIC | 0 refills | Status: DC

## 2019-04-09 MED ORDER — HYDROXYZINE HCL 10 MG PO TABS
10.0000 mg | ORAL_TABLET | Freq: Three times a day (TID) | ORAL | Status: DC
  Administered 2019-04-09: 10 mg via ORAL
  Filled 2019-04-09 (×3): qty 1

## 2019-04-09 MED ORDER — LACOSAMIDE 100 MG PO TABS: 100.0000 mg | ORAL_TABLET | Freq: Two times a day (BID) | ORAL | 0 refills | Status: AC

## 2019-04-09 NOTE — Discharge Summary (Signed)
[facility] [place] NAME: Mike [name]    MR#:  [id number]  DATE OF BIRTH:  [birth year]  DATE OF ADMISSION:  03/26/2019   ADMITTING PHYSICIAN: Lang Snow, NP  DATE OF DISCHARGE: 04/09/19  PRIMARY CARE PHYSICIAN: Adin Hector, MD   ADMISSION DIAGNOSIS:  Fever [R50.9] Fall, initial encounter [W19.XXXA] Altered mental status, unspecified altered mental status type [R41.82] Sepsis, due to unspecified organism, unspecified whether acute organ dysfunction present ([place]) [A41.9] DISCHARGE DIAGNOSIS:  Active Problems:   Sepsis ([place])   Pressure injury of skin  SECONDARY DIAGNOSIS:   Past Medical History:  Diagnosis Date  . Anxiety   . Diabetes mellitus without complication ([place])   . Epilepsy ([place])   . High cholesterol   . Hypertension   . Prostate cancer [facility]) 2004   prostate removed   HOSPITAL COURSE:   Mike [name] is a 73 year old male who presented to the ED with shortness of breath and fever.  In the ED, he was meeting sepsis criteria with fever, tachycardia, and lactic acidosis.  He was started on broad-spectrum antibiotics and he was admitted for further management.  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  Initially meeting sepsis criteria with fever and tachycardia, but sepsis has been ruled out.  Patient has been afebrile for > 72 48 hours. -Blood and urine cultures x2 with no growth -ID following -Completed course of antibiotics while in the hospital  Left upper extremity DVT- likely due to hypercoagulable state in the setting of AML. -Left upper extremity duplex ultrasound on 10/19 was positive for DVT -Patient will receive eliquis 10mg  bid x 6 more days, then transition to eliquis 5mg  bid after that -Keep left upper extremity elevated  AML-new diagnosis -Needs to follow-up with oncology as an outpatient -Outpatient palliative care to follow on discharge  Pancytopenia with iron deficiency anemia- cell counts are low  but stable. Likely due to AML. -Received a dose of IV iron during this hospitalization -Needs CBC rechecked as an outpatient  Seizure disorder- no seizure-like episodes here -Phenobarbital has been stopped due to pancytopenia and patient has been started on Vimpat  Rectal pain-resolved.  Likely due to perianal ulceration and erythema. -Patient underwent flex sig on 10/16, which was normal other than the perianal findings -Follow-up with GI in 3-4 weeks  Chronic anxiety-stable -Continued home Paxil and Xanax  Hypertension-stable -Continuedd home metoprolol  Type 2 diabetes-stable -Continue Lantus and SSI  Stage II pressure ulcers on the bilateral buttocks- present on admission -Wound RN recommended low air loss mattress, frequent turns, bilateral buttock silicone foam dressings, and bilateral pressure redistribution heel boots  Acute urinary retention- foley placed 10/12 -Urology (Dr. Jeffie Pollock) was consulted and recommended outpatient voiding trial -Needs to f/u with urology in 1 week -Flomax was started this admission  DISCHARGE CONDITIONS:  AML Left upper extremity DVT Pancytopenia Seizure disorder Rectal pain Chronic anxiety Hypertension Type 2 diabetes Stage II pressure ulcer on the bilateral buttocks Acute urinary retention with Foley in place CONSULTS OBTAINED:  Treatment Team:  Lequita Asal, MD Irine Seal, MD Tsosie Billing, MD Leotis Pain, MD DRUG ALLERGIES:   Allergies  Allergen Reactions  . Naproxen Sodium Other (See Comments)    Reaction: Upset stomach  . Nsaids Other (See Comments)    Other reaction(s): Unknown  . Aleve [Naproxen] Nausea And Vomiting  . Ibuprofen Nausea Only    Reaction: Upset stomach    DISCHARGE MEDICATIONS:   Allergies as of 04/09/2019  Reactions   Naproxen Sodium Other (See Comments)   Reaction: Upset stomach   Nsaids Other (See Comments)   Other reaction(s): Unknown   Aleve [naproxen]  Nausea And Vomiting   Ibuprofen Nausea Only   Reaction: Upset stomach      Medication List    STOP taking these medications   irbesartan 300 MG tablet Commonly known as: AVAPRO   PHENobarbital 64.8 MG tablet Commonly known as: LUMINAL     TAKE these medications   acetaminophen-codeine 300-30 MG tablet Commonly known as: TYLENOL #3 Take 1 tablet by mouth every 8 (eight) hours as needed for moderate pain.   ALPRAZolam 0.5 MG tablet Commonly known as: XANAX Take 0.5-1 mg by mouth 2 (two) times daily. 0.5 mg in the morning and 1 mg at bedtime   apixaban 5 MG Tabs tablet Commonly known as: ELIQUIS Take 2 tablets (10mg ) by mouth twice a day for 6 more days, then take 1 tablet (5mg ) by mouth twice a day   aspirin EC 81 MG tablet Take 81 mg by mouth daily.   atorvastatin 20 MG tablet Commonly known as: LIPITOR Take 20 mg by mouth daily.   feeding supplement (NEPRO CARB STEADY) Liqd Take 237 mLs by mouth 2 (two) times daily between meals.   furosemide 40 MG tablet Commonly known as: LASIX Take 40 mg by mouth daily as needed for edema.   gabapentin 300 MG capsule Commonly known as: NEURONTIN Take 300 mg by mouth daily.   gabapentin 300 MG capsule Commonly known as: NEURONTIN Take 1,200 mg by mouth at bedtime.   glimepiride 2 MG tablet Commonly known as: AMARYL Take 2-4 mg by mouth 2 (two) times daily. 4 mg in the morning and 2 mg in the evening   hydrOXYzine 25 MG tablet Commonly known as: ATARAX/VISTARIL Take 25-50 mg by mouth 2 (two) times daily. 25 mg in the morning and 50 mg at bedtime   Lacosamide 100 MG Tabs Take 1 tablet (100 mg total) by mouth 2 (two) times daily.   metoprolol succinate 50 MG 24 hr tablet Commonly known as: TOPROL-XL Take 50 mg by mouth 2 (two) times daily.   naphazoline-glycerin 0.012-0.2 % Soln Commonly known as: CLEAR EYES REDNESS Place 2 drops into both eyes daily.   Nitrostat 0.4 MG SL tablet Generic drug: nitroGLYCERIN Take  0.4 mg by mouth every 5 (five) minutes as needed for chest pain.   Omega-3 1000 MG Caps Take 1,000 mg by mouth daily.   omeprazole 20 MG capsule Commonly known as: PRILOSEC Take 20 mg by mouth daily.   PARoxetine 20 MG tablet Commonly known as: PAXIL Take 1 tablet (20 mg total) by mouth daily.   pioglitazone 30 MG tablet Commonly known as: ACTOS Take 30 mg by mouth daily.   polyethylene glycol 17 g packet Commonly known as: MIRALAX / GLYCOLAX Take 17 g by mouth daily as needed for moderate constipation.   potassium chloride 10 MEQ tablet Commonly known as: KLOR-CON Take 10 mEq by mouth daily as needed (when taking furosemide).   tamsulosin 0.4 MG Caps capsule Commonly known as: FLOMAX Take 1 capsule (0.4 mg total) by mouth daily. Start taking on: April 10, 2019   Vitamin B-12 CR 1000 MCG Tbcr Take 2,000 mcg by mouth daily.   Vitamin D-1000 Max St 25 MCG (1000 UT) tablet Generic drug: Cholecalciferol Take 1,000 Units by mouth daily.        DISCHARGE INSTRUCTIONS:  1.  Follow-up with PCP in  5 days 2.  Follow-up with oncology in 2-3 days 3.  Follow-up with urology in 1 week 4.  Stop phenobarbital and Avapro 5.  Start Vimpat 100 mg twice daily 6.  Take Eliquis 10 mg twice daily for 6 more days, then take Eliquis 5 mg twice daily 7.  Take Flomax 0.5 mg daily DIET:  Cardiac diet and Diabetic diet DISCHARGE CONDITION:  Stable ACTIVITY:  Activity as tolerated OXYGEN:  Home Oxygen: No.  Oxygen Delivery: room air DISCHARGE LOCATION:  nursing home   If you experience worsening of your admission symptoms, develop shortness of breath, life threatening emergency, suicidal or homicidal thoughts you must seek medical attention immediately by calling 911 or calling your MD immediately  if symptoms less severe.  You Must read complete instructions/literature along with all the possible adverse reactions/side effects for all the Medicines you take and that have been  prescribed to you. Take any new Medicines after you have completely understood and accpet all the possible adverse reactions/side effects.   Please note  You were cared for by a hospitalist during your hospital stay. If you have any questions about your discharge medications or the care you received while you were in the hospital after you are discharged, you can call the unit and asked to speak with the hospitalist on call if the hospitalist that took care of you is not available. Once you are discharged, your primary care physician will handle any further medical issues. Please note that NO REFILLS for any discharge medications will be authorized once you are discharged, as it is imperative that you return to your primary care physician (or establish a relationship with a primary care physician if you do not have one) for your aftercare needs so that they can reassess your need for medications and monitor your lab values.    On the day of Discharge:  VITAL SIGNS:  Blood pressure 122/88, pulse (!) 104, temperature 98.2 F (36.8 C), temperature source Oral, resp. rate 18, height 5\' 7"  (1.702 m), weight 90 kg, SpO2 97 %. PHYSICAL EXAMINATION:  Constitutional: Laying in bed, in NAD, well-appearing. HENT: Normocephalic. Atraumatic. Oropharynx is clear and moist.  Eyes: Conjunctivae and EOM are normal. PERRLA, no scleral icterus.  Neck: Normal ROM. Neck supple. No JVD. No tracheal deviation. CVS: RRR, S1/S2, no murmurs, no gallops, no carotid bruit.  Pulmonary: Effort and breath sounds normal, no stridor, rhonchi, wheezes, rales.  Abdominal: Soft. BS +,  no distension, tenderness, rebound or guarding.  Musculoskeletal: Normal range of motion. + Left upper extremity is mildly swollen.  No erythema. Neuro: Alert. CN 2-12 grossly intact. No focal deficits. Skin: Skin is warm and dry. No rash noted. Psychiatric: Calm. Answering questions appropriately. DATA REVIEW:   CBC Recent Labs  Lab 04/09/19  0408  WBC 2.9*  HGB 7.8*  HCT 24.6*  PLT 169    Chemistries  Recent Labs  Lab 04/08/19 0500 04/09/19 0408  NA 139 140  K 2.9* 4.0  CL 98 101  CO2 31 30  GLUCOSE 179* 166*  BUN 21 18  CREATININE 0.77 0.73  CALCIUM 7.4* 7.8*  MG 2.0  --      Microbiology Results  Results for orders placed or performed during the hospital encounter of 03/26/19  Culture, blood (Routine x 2)     Status: None   Collection Time: 03/26/19  8:12 PM   Specimen: BLOOD  Result Value Ref Range Status   Specimen Description BLOOD LEFT ANTECUBITAL  Final  Special Requests   Final    BOTTLES DRAWN AEROBIC AND ANAEROBIC Blood Culture adequate volume   Culture   Final    NO GROWTH 5 DAYS Performed at [facility], [address] [place], [place] [address]    Report Status 03/31/2019 FINAL  Final  Culture, blood (Routine x 2)     Status: None   Collection Time: 03/26/19  8:50 PM   Specimen: BLOOD  Result Value Ref Range Status   Specimen Description BLOOD BLOOD RIGHT HAND  Final   Special Requests   Final    BOTTLES DRAWN AEROBIC AND ANAEROBIC Blood Culture adequate volume   Culture   Final    NO GROWTH 5 DAYS Performed at [facility], [address] [place], [place] [address]    Report Status 03/31/2019 FINAL  Final  SARS Coronavirus 2 [facility] order, Performed in [facility] hospital lab) Nasopharyngeal Nasopharyngeal Swab     Status: None   Collection Time: 03/26/19  8:50 PM   Specimen: Nasopharyngeal Swab  Result Value Ref Range Status   SARS Coronavirus 2 NEGATIVE NEGATIVE Final    Comment: (NOTE) If result is NEGATIVE SARS-CoV-2 target nucleic acids are NOT DETECTED. The SARS-CoV-2 RNA is generally detectable in upper and lower  respiratory specimens during the acute phase of infection. The lowest  concentration of SARS-CoV-2 viral copies this assay can detect is 250  copies / mL. A negative result does not preclude SARS-CoV-2 infection  and should not be  used as the sole basis for treatment or other  patient management decisions.  A negative result may occur with  improper specimen collection / handling, submission of specimen other  than nasopharyngeal swab, presence of viral mutation(s) within the  areas targeted by this assay, and inadequate number of viral copies  (<250 copies / mL). A negative result must be combined with clinical  observations, patient history, and epidemiological information. If result is POSITIVE SARS-CoV-2 target nucleic acids are DETECTED. The SARS-CoV-2 RNA is generally detectable in upper and lower  respiratory specimens dur ing the acute phase of infection.  Positive  results are indicative of active infection with SARS-CoV-2.  Clinical  correlation with patient history and other diagnostic information is  necessary to determine patient infection status.  Positive results do  not rule out bacterial infection or co-infection with other viruses. If result is PRESUMPTIVE POSTIVE SARS-CoV-2 nucleic acids MAY BE PRESENT.   A presumptive positive result was obtained on the submitted specimen  and confirmed on repeat testing.  While 2019 novel coronavirus  (SARS-CoV-2) nucleic acids may be present in the submitted sample  additional confirmatory testing may be necessary for epidemiological  and / or clinical management purposes  to differentiate between  SARS-CoV-2 and other Sarbecovirus currently known to infect humans.  If clinically indicated additional testing with an alternate test  methodology [phone]) is advised. The SARS-CoV-2 RNA is generally  detectable in upper and lower respiratory sp ecimens during the acute  phase of infection. The expected result is Negative. Fact Sheet for Patients:  StrictlyIdeas.no Fact Sheet for Healthcare Providers: BankingDealers.co.za This test is not yet approved or cleared by the [place] FDA and has been authorized  for detection and/or diagnosis of SARS-CoV-2 by FDA under an Emergency Use Authorization (EUA).  This EUA will remain in effect (meaning this test can be used) for the duration of the COVID-19 declaration under Section 564(b)(1) of the Act, 21 U.S.C. section 360bbb-3(b)(1), unless the authorization is  terminated or revoked sooner. Performed at [facility], [address], [place], [place] [address]   Urine culture     Status: None   Collection Time: 03/26/19  8:51 PM   Specimen: In/Out Cath Urine  Result Value Ref Range Status   Specimen Description   Final    IN/OUT CATH URINE Performed at [facility], [address], [place], [place] [address]    Special Requests   Final    NONE Performed at [facility], [address], [place], [place] [address]    Culture   Final    NO GROWTH Performed at [facility], [place] [address] [place], [place] [address]    Report Status 03/27/2019 FINAL  Final  MRSA PCR Screening     Status: None   Collection Time: 03/27/19 10:00 AM   Specimen: Nasal Mucosa; Nasopharyngeal  Result Value Ref Range Status   MRSA by PCR NEGATIVE NEGATIVE Final    Comment:        The GeneXpert MRSA Assay (FDA approved for NASAL specimens only), is one component of a comprehensive MRSA colonization surveillance program. It is not intended to diagnose MRSA infection nor to guide or monitor treatment for MRSA infections. Performed at [facility], [address] [place], [place] [address]   CULTURE, BLOOD (ROUTINE X 2) w Reflex to ID Panel     Status: None   Collection Time: 03/30/19  9:13 AM   Specimen: BLOOD  Result Value Ref Range Status   Specimen Description BLOOD R HAND  Final   Special Requests   Final    BOTTLES DRAWN AEROBIC AND ANAEROBIC Blood Culture adequate volume   Culture   Final    NO GROWTH 5 DAYS Performed at [facility], [address] [place], [place] [address]    Report Status  04/04/2019 FINAL  Final  CULTURE, BLOOD (ROUTINE X 2) w Reflex to ID Panel     Status: None   Collection Time: 03/30/19  9:21 AM   Specimen: BLOOD  Result Value Ref Range Status   Specimen Description BLOOD R AC  Final   Special Requests   Final    BOTTLES DRAWN AEROBIC AND ANAEROBIC Blood Culture adequate volume   Culture   Final    NO GROWTH 5 DAYS Performed at [facility], [address], [place], [place] [address]    Report Status 04/04/2019 FINAL  Final  Urine Culture     Status: None   Collection Time: 03/30/19  9:21 AM   Specimen: Urine, Catheterized  Result Value Ref Range Status   Specimen Description   Final    URINE, CATHETERIZED Performed at [facility], [address], [place], [place] [address]    Special Requests   Final    NONE Performed at [facility], [address], [place],  [address]    Culture   Final    NO GROWTH Performed at [facility], [place] [address] [place],  [address]    Report Status 03/31/2019 FINAL  Final  SARS CORONAVIRUS 2 (TAT 6-24 HRS) Nasopharyngeal Nasopharyngeal Swab     Status: None   Collection Time: 04/07/19 10:34 AM   Specimen: Nasopharyngeal Swab  Result Value Ref Range Status   SARS Coronavirus 2 NEGATIVE NEGATIVE Final    Comment: (NOTE) SARS-CoV-2 target nucleic acids are NOT DETECTED. The SARS-CoV-2 RNA is generally detectable in upper and lower respiratory specimens during the  acute phase of infection. Negative results do not preclude SARS-CoV-2 infection, do not rule out co-infections with other pathogens, and should not be used as the sole basis for treatment or other patient management decisions. Negative results must be combined with clinical observations, patient history, and epidemiological information. The expected result is Negative. Fact Sheet for Patients: SugarRoll.be Fact Sheet for Healthcare Providers:  https://www.woods-mathews.com/ This test is not yet approved or cleared by the [place] FDA and  has been authorized for detection and/or diagnosis of SARS-CoV-2 by FDA under an Emergency Use Authorization (EUA). This EUA will remain  in effect (meaning this test can be used) for the duration of the COVID-19 declaration under Section 56 4(b)(1) of the Act, 21 U.S.C. section 360bbb-3(b)(1), unless the authorization is terminated or revoked sooner. Performed at [facility], [place] [address], [place], [place] [address]     RADIOLOGY:  No results found.   Management plans discussed with the patient, family and they are in agreement.  CODE STATUS: DNR   TOTAL TIME TAKING CARE OF THIS PATIENT: 45 minutes.    Berna Spare  M.D on 04/09/2019 at 12:25 PM  Between 7am to 6pm - Pager - [phone]  After 6pm go to [email or web address] - password [email or web address] [facility]  [facility]  Office  [phone]  CC: Primary care physician; Adin Hector, MD   Note: This dictation was prepared with Dragon dictation along with smaller phrase technology. Any transcriptional errors that result from this process are unintentional.

## 2019-04-09 NOTE — TOC Transition Note (Signed)
Transition of Care [facility]) - CM/SW Discharge Note   Patient Details  Name: Mike [name] MRN: [id number] Date of Birth: December 02, 1945  [facility] CM/SW Contact:  Shelbie Hutching, RN Phone Number: 04/09/2019, 3:10 PM   Clinical Narrative:    Patient has been medically cleared for discharge and is discharged to [facility] in [place].  Patient did not qualify for [facility]'s 30 day intensive AML treatment program.  Patient is going to room 502, bedside RN to call report to [facility] at [phone].   Final next level of care: Skilled Nursing Facility Barriers to Discharge: Barriers Resolved   Patient Goals and CMS Choice Patient states their goals for this hospitalization and ongoing recovery are:: would like to go home CMS Medicare.gov Compare Post Acute Care list provided to:: Patient Represenative (must comment) Choice offered to / list presented to : Sibling([place]- Sister)  Discharge Placement              Patient chooses bed at: [facility] Patient to be transferred to facility by: [facility] Name of family member notified: [name]- sister Patient and family notified of of transfer: 04/09/19  Discharge Plan and Services   Discharge Planning Services: CM Consult Post Acute Care Choice: [place]                               Social Determinants of Health ([place]) Interventions     Readmission Risk Interventions Readmission Risk Prevention Plan 04/09/2019  Transportation Screening Complete  PCP or Specialist Appt within 3-5 Days Complete  HRI or Home Care Consult Complete  Social Work Consult for [place] Planning/Counseling Complete  Palliative Care Screening Not Applicable  Medication Review Press photographer) Complete  Some recent data might be hidden

## 2019-04-10 DIAGNOSIS — I82629 Acute embolism and thrombosis of deep veins of unspecified upper extremity: Secondary | ICD-10-CM | POA: Insufficient documentation

## 2019-04-12 ENCOUNTER — Emergency Department
Admission: EM | Admit: 2019-04-12 | Discharge: 2019-04-13 | Disposition: A | Discharge status: Home / Self Care | Payer: Medicare Other | Attending: Emergency Medicine | Admitting: Emergency Medicine

## 2019-04-12 ENCOUNTER — Encounter: Payer: Self-pay | Admitting: Emergency Medicine

## 2019-04-12 DIAGNOSIS — Z8546 Personal history of malignant neoplasm of prostate: Secondary | ICD-10-CM | POA: Diagnosis not present

## 2019-04-12 DIAGNOSIS — C9200 Acute myeloblastic leukemia, not having achieved remission: Secondary | ICD-10-CM | POA: Insufficient documentation

## 2019-04-12 DIAGNOSIS — D509 Iron deficiency anemia, unspecified: Secondary | ICD-10-CM

## 2019-04-12 DIAGNOSIS — Z7901 Long term (current) use of anticoagulants: Secondary | ICD-10-CM | POA: Diagnosis not present

## 2019-04-12 DIAGNOSIS — Z7982 Long term (current) use of aspirin: Secondary | ICD-10-CM | POA: Insufficient documentation

## 2019-04-12 DIAGNOSIS — F332 Major depressive disorder, recurrent severe without psychotic features: Secondary | ICD-10-CM | POA: Diagnosis present

## 2019-04-12 DIAGNOSIS — E119 Type 2 diabetes mellitus without complications: Secondary | ICD-10-CM | POA: Diagnosis not present

## 2019-04-12 DIAGNOSIS — Z20828 Contact with and (suspected) exposure to other viral communicable diseases: Secondary | ICD-10-CM | POA: Diagnosis not present

## 2019-04-12 DIAGNOSIS — F4325 Adjustment disorder with mixed disturbance of emotions and conduct: Secondary | ICD-10-CM | POA: Diagnosis present

## 2019-04-12 DIAGNOSIS — Z794 Long term (current) use of insulin: Secondary | ICD-10-CM | POA: Diagnosis not present

## 2019-04-12 DIAGNOSIS — Z79899 Other long term (current) drug therapy: Secondary | ICD-10-CM | POA: Insufficient documentation

## 2019-04-12 DIAGNOSIS — I10 Essential (primary) hypertension: Secondary | ICD-10-CM | POA: Insufficient documentation

## 2019-04-12 DIAGNOSIS — F329 Major depressive disorder, single episode, unspecified: Secondary | ICD-10-CM | POA: Diagnosis present

## 2019-04-12 LAB — SALICYLATE LEVEL: Salicylate Lvl: <7 mg/dL (ref 2.8–30.0)

## 2019-04-12 LAB — ETHANOL: Alcohol, Ethyl (B): <10 mg/dL (ref <10)

## 2019-04-12 LAB — URINE DRUG SCREEN, QUALITATIVE (ARMC ONLY)
Amphetamines, Ur Screen: NOT DETECTED
Barbiturates, Ur Screen: POSITIVE — AB
Benzodiazepine, Ur Scrn: POSITIVE — AB
Cannabinoid 50 Ng, Ur ~~LOC~~: NOT DETECTED
Cocaine Metabolite,Ur ~~LOC~~: NOT DETECTED
MDMA (Ecstasy)Ur Screen: NOT DETECTED
Methadone Scn, Ur: NOT DETECTED
Opiate, Ur Screen: NOT DETECTED
Phencyclidine (PCP) Ur S: NOT DETECTED
Tricyclic, Ur Screen: NOT DETECTED

## 2019-04-12 LAB — COMPREHENSIVE METABOLIC PANEL
ALT: 55 U/L — ABNORMAL HIGH (ref 0–44)
AST: 65 U/L — ABNORMAL HIGH (ref 15–41)
Albumin: 2.6 g/dL — ABNORMAL LOW (ref 3.5–5.0)
Alkaline Phosphatase: 117 U/L (ref 38–126)
Anion gap: 14 (ref 5–15)
BUN: 14 mg/dL (ref 8–23)
CO2: 25 mmol/L (ref 22–32)
Calcium: 8.2 mg/dL — ABNORMAL LOW (ref 8.9–10.3)
Chloride: 97 mmol/L — ABNORMAL LOW (ref 98–111)
Creatinine, Ser: 0.76 mg/dL (ref 0.61–1.24)
GFR calc Af Amer: >60 mL/min (ref >60)
GFR calc non Af Amer: >60 mL/min (ref >60)
Glucose, Bld: 188 mg/dL — ABNORMAL HIGH (ref 70–99)
Potassium: 3.5 mmol/L (ref 3.5–5.1)
Sodium: 136 mmol/L (ref 135–145)
Total Bilirubin: 1 mg/dL (ref 0.3–1.2)
Total Protein: 6.4 g/dL — ABNORMAL LOW (ref 6.5–8.1)

## 2019-04-12 LAB — CBC
HCT: 23.6 % — ABNORMAL LOW (ref 39.0–52.0)
Hemoglobin: 7.4 g/dL — ABNORMAL LOW (ref 13.0–17.0)
MCH: 24.1 pg — ABNORMAL LOW (ref 26.0–34.0)
MCHC: 31.4 g/dL (ref 30.0–36.0)
MCV: 76.9 fL — ABNORMAL LOW (ref 80.0–100.0)
Platelets: 213 10*3/uL (ref 150–400)
RBC: 3.07 MIL/uL — ABNORMAL LOW (ref 4.22–5.81)
RDW: 22.4 % — ABNORMAL HIGH (ref 11.5–15.5)
WBC: 2.5 10*3/uL — ABNORMAL LOW (ref 4.0–10.5)
nRBC: 2.8 % — ABNORMAL HIGH (ref 0.0–0.2)

## 2019-04-12 LAB — SARS CORONAVIRUS 2 BY RT PCR (HOSPITAL ORDER, PERFORMED IN ~~LOC~~ HOSPITAL LAB): SARS Coronavirus 2: NEGATIVE

## 2019-04-12 LAB — ACETAMINOPHEN LEVEL: Acetaminophen (Tylenol), Serum: <10 ug/mL — ABNORMAL LOW (ref 10–30)

## 2019-04-12 MED ORDER — PANTOPRAZOLE SODIUM 40 MG PO TBEC: 40.0000 mg | DELAYED_RELEASE_TABLET | Freq: Every day | ORAL | Status: DC

## 2019-04-12 MED ORDER — METOPROLOL SUCCINATE ER 50 MG PO TB24
50.0000 mg | ORAL_TABLET | Freq: Two times a day (BID) | ORAL | Status: DC
  Administered 2019-04-12 – 2019-04-13 (×2): 50 mg via ORAL
  Filled 2019-04-12 (×2): qty 1

## 2019-04-12 MED ORDER — CLONAZEPAM 0.5 MG PO TABS
0.5000 mg | ORAL_TABLET | Freq: Two times a day (BID) | ORAL | Status: DC
  Administered 2019-04-12 – 2019-04-13 (×2): 0.5 mg via ORAL
  Filled 2019-04-12 (×2): qty 1

## 2019-04-12 MED ORDER — ASPIRIN EC 81 MG PO TBEC: 81.0000 mg | DELAYED_RELEASE_TABLET | Freq: Every day | ORAL | Status: DC

## 2019-04-12 MED ORDER — GLIMEPIRIDE 2 MG PO TABS: 2.0000 mg | ORAL_TABLET | Freq: Two times a day (BID) | ORAL | Status: DC

## 2019-04-12 MED ORDER — TAMSULOSIN HCL 0.4 MG PO CAPS
0.4000 mg | ORAL_CAPSULE | Freq: Every day | ORAL | Status: DC
  Administered 2019-04-13: 0.4 mg via ORAL
  Filled 2019-04-12: qty 1

## 2019-04-12 MED ORDER — INSULIN ASPART 100 UNIT/ML ~~LOC~~ SOLN: 2.0000 [IU] | Freq: Three times a day (TID) | SUBCUTANEOUS | Status: DC

## 2019-04-12 MED ORDER — GABAPENTIN 300 MG PO CAPS
1200.0000 mg | ORAL_CAPSULE | Freq: Every day | ORAL | Status: DC
  Administered 2019-04-12: 1200 mg via ORAL
  Filled 2019-04-12: qty 4

## 2019-04-12 MED ORDER — POLYETHYLENE GLYCOL 3350 17 G PO PACK: 17.0000 g | PACK | Freq: Every day | ORAL | Status: DC | PRN

## 2019-04-12 MED ORDER — HYDROXYZINE HCL 25 MG PO TABS
25.0000 mg | ORAL_TABLET | Freq: Two times a day (BID) | ORAL | Status: DC
  Administered 2019-04-12 – 2019-04-13 (×2): 25 mg via ORAL
  Filled 2019-04-12 (×2): qty 1

## 2019-04-12 MED ORDER — ATORVASTATIN CALCIUM 20 MG PO TABS: 20.0000 mg | ORAL_TABLET | Freq: Every day | ORAL | Status: DC

## 2019-04-12 MED ORDER — VITAMIN B-12 ER 1000 MCG PO TBCR: 2000.0000 ug | EXTENDED_RELEASE_TABLET | Freq: Every day | ORAL | Status: DC

## 2019-04-12 MED ORDER — OMEGA-3 1000 MG PO CAPS: 1000.0000 mg | ORAL_CAPSULE | Freq: Every day | ORAL | Status: DC

## 2019-04-12 MED ORDER — PIOGLITAZONE HCL 30 MG PO TABS
30.0000 mg | ORAL_TABLET | Freq: Every day | ORAL | Status: DC
  Administered 2019-04-13: 30 mg via ORAL
  Filled 2019-04-12: qty 1

## 2019-04-12 MED ORDER — HYDROXYZINE HCL 25 MG PO TABS: 25.0000 mg | ORAL_TABLET | Freq: Two times a day (BID) | ORAL | Status: DC

## 2019-04-12 MED ORDER — HYDROCODONE-ACETAMINOPHEN 5-325 MG PO TABS
1.0000 | ORAL_TABLET | Freq: Four times a day (QID) | ORAL | Status: DC | PRN
  Administered 2019-04-12: 1 via ORAL
  Filled 2019-04-12: qty 1

## 2019-04-12 MED ORDER — APIXABAN 5 MG PO TABS
5.0000 mg | ORAL_TABLET | Freq: Two times a day (BID) | ORAL | Status: DC
  Administered 2019-04-12 – 2019-04-13 (×2): 5 mg via ORAL
  Filled 2019-04-12 (×2): qty 1

## 2019-04-12 MED ORDER — GLIMEPIRIDE 2 MG PO TABS
2.0000 mg | ORAL_TABLET | Freq: Two times a day (BID) | ORAL | Status: DC
  Administered 2019-04-12 – 2019-04-13 (×2): 2 mg via ORAL
  Filled 2019-04-12 (×3): qty 1

## 2019-04-12 MED ORDER — PAROXETINE HCL 20 MG PO TABS: 20.0000 mg | ORAL_TABLET | Freq: Every day | ORAL | Status: DC

## 2019-04-12 MED ORDER — PAROXETINE HCL 20 MG PO TABS
20.0000 mg | ORAL_TABLET | Freq: Every day | ORAL | Status: DC
  Filled 2019-04-12: qty 1

## 2019-04-12 MED ORDER — LACOSAMIDE 50 MG PO TABS
100.0000 mg | ORAL_TABLET | Freq: Two times a day (BID) | ORAL | Status: DC
  Administered 2019-04-12 – 2019-04-13 (×2): 100 mg via ORAL
  Filled 2019-04-12 (×2): qty 2

## 2019-04-12 MED ORDER — FERROUS SULFATE 325 (65 FE) MG PO TABS
325.0000 mg | ORAL_TABLET | Freq: Two times a day (BID) | ORAL | Status: DC
  Administered 2019-04-12 – 2019-04-13 (×2): 325 mg via ORAL
  Filled 2019-04-12 (×4): qty 1

## 2019-04-12 MED ORDER — PANTOPRAZOLE SODIUM 40 MG PO TBEC
40.0000 mg | DELAYED_RELEASE_TABLET | Freq: Every day | ORAL | Status: DC
  Administered 2019-04-13: 40 mg via ORAL
  Filled 2019-04-12: qty 1

## 2019-04-12 MED ORDER — POTASSIUM CHLORIDE CRYS ER 20 MEQ PO TBCR: 10.0000 meq | EXTENDED_RELEASE_TABLET | Freq: Every day | ORAL | Status: DC | PRN

## 2019-04-12 MED ORDER — GABAPENTIN 300 MG PO CAPS: 300.0000 mg | ORAL_CAPSULE | Freq: Every day | ORAL | Status: DC

## 2019-04-12 MED ORDER — VITAMIN B-12 1000 MCG PO TABS
2000.0000 ug | ORAL_TABLET | Freq: Every day | ORAL | Status: DC
  Administered 2019-04-13: 2000 ug via ORAL
  Filled 2019-04-12: qty 2

## 2019-04-12 MED ORDER — INSULIN GLARGINE 100 UNIT/ML ~~LOC~~ SOLN
5.0000 [IU] | Freq: Every day | SUBCUTANEOUS | Status: DC
  Administered 2019-04-13: 5 [IU] via SUBCUTANEOUS
  Filled 2019-04-12: qty 0.05

## 2019-04-12 MED ORDER — OMEGA-3-ACID ETHYL ESTERS 1 G PO CAPS
1000.0000 mg | ORAL_CAPSULE | Freq: Every day | ORAL | Status: DC
  Administered 2019-04-13: 1000 mg via ORAL
  Filled 2019-04-12: qty 1

## 2019-04-12 MED ORDER — FUROSEMIDE 40 MG PO TABS: 40.0000 mg | ORAL_TABLET | Freq: Every day | ORAL | Status: DC | PRN

## 2019-04-12 MED ORDER — PIOGLITAZONE HCL 30 MG PO TABS: 30.0000 mg | ORAL_TABLET | Freq: Every day | ORAL | Status: DC

## 2019-04-12 MED ORDER — GABAPENTIN 300 MG PO CAPS
300.0000 mg | ORAL_CAPSULE | Freq: Every day | ORAL | Status: DC
  Administered 2019-04-13: 300 mg via ORAL
  Filled 2019-04-12: qty 1

## 2019-04-12 MED ORDER — NEPRO/CARBSTEADY PO LIQD: 237.0000 mL | Freq: Two times a day (BID) | ORAL | Status: DC

## 2019-04-12 MED ORDER — TAMSULOSIN HCL 0.4 MG PO CAPS: 0.4000 mg | ORAL_CAPSULE | Freq: Every day | ORAL | Status: DC

## 2019-04-12 MED ORDER — ATORVASTATIN CALCIUM 20 MG PO TABS
20.0000 mg | ORAL_TABLET | Freq: Every day | ORAL | Status: DC
  Administered 2019-04-12: 20 mg via ORAL
  Filled 2019-04-12: qty 1

## 2019-04-12 MED ORDER — NAPHAZOLINE-GLYCERIN 0.012-0.2 % OP SOLN
2.0000 [drp] | Freq: Every day | OPHTHALMIC | Status: DC
  Filled 2019-04-12: qty 15

## 2019-04-12 MED ORDER — ASPIRIN EC 81 MG PO TBEC
81.0000 mg | DELAYED_RELEASE_TABLET | Freq: Every day | ORAL | Status: DC
  Administered 2019-04-13: 81 mg via ORAL
  Filled 2019-04-12: qty 1

## 2019-04-12 NOTE — ED Notes (Signed)
Pt remains in medical gown due to pt being medically monitored. Pt only came in gown and brief. Remains in same at this time.

## 2019-04-12 NOTE — ED Notes (Signed)
Chux placed under pt in layers to help pad pt's pressure ulcers per MD.

## 2019-04-12 NOTE — ED Notes (Signed)
Psychiatry at bedside.

## 2019-04-12 NOTE — ED Provider Notes (Addendum)
[facility] Emergency Department Provider Note  ____________________________________________   First MD Initiated Contact with Patient 04/12/19 1516     (approximate)  I have reviewed the triage vital signs and the nursing notes.   HISTORY  Chief Complaint Suicidal    HPI Mike [name] is a 73 y.o. male with past medical history as below here with suicidal attempt.  The patient states that he was recently hospitalized for sepsis and infection.  He was discharged to a skilled nursing facility.  He states he did not want to go at that time and since then, has felt very depressed about his living situation.  He has a history of depression and is thought about harming himself, but states that due to being trapped in the skilled nursing facility, he became suicidal and wants to end his life.  He tried to cut his wrist with a fingernail clipper today.  He states that he continues to have suicidal ideation and would attempt to harm himself if he was told to leave and go back to the skilled nursing was noted.  He was living alone prior to this.  He states that his sisters have pushed him to go to the skilled nursing facility, he does not want talk to him at this time.        Past Medical History:  Diagnosis Date  . Anxiety   . Diabetes mellitus without complication ([place])   . Epilepsy ([place])   . High cholesterol   . Hypertension   . Prostate cancer [facility]) 2004   prostate removed    Patient Active Problem List   Diagnosis Date Noted  . 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 Pressure injury of skin 04/05/2019  . Sepsis ([place]) 03/27/2019  . AKI (acute kidney injury) ([place]) 07/24/2018  . 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 ([place]) 06/13/2018    Past Surgical History:  Procedure Laterality Date  . CARDIAC SURGERY    . FLEXIBLE SIGMOIDOSCOPY N/A 04/04/2019   Procedure: FLEXIBLE SIGMOIDOSCOPY;  Surgeon: Virgel Manifold, MD;  Location: [facility] ENDOSCOPY;  Service: Endoscopy;  Laterality: N/A;  . IRRIGATION AND DEBRIDEMENT FOOT Right 06/14/2018   Procedure: IRRIGATION AND DEBRIDEMENT FOOT;  Surgeon: Sharlotte Alamo, DPM;  Location: [facility] ORS;  Service: Podiatry;  Laterality: Right;    Prior to Admission medications   Medication Sig Start Date End Date Taking? Authorizing Provider  acetaminophen-codeine (TYLENOL #3) 300-30 MG tablet Take 1 tablet by mouth every 8 (eight) hours as needed for moderate pain. 02/14/19  Yes Gregor Hams, MD  ALPRAZolam Duanne Moron) 0.5 MG tablet Take 0.5-1 mg by mouth 2 (two) times daily. 0.5 mg in the morning and 1 mg at bedtime   Yes [provider]  apixaban (ELIQUIS) 5 MG TABS tablet Take 2 tablets (10mg ) by mouth twice a day for 6 more days, then take 1 tablet (5mg ) by mouth twice a day 04/09/19  Yes Mayo, Pete Pelt, MD  aspirin EC 81 MG tablet Take 81 mg by mouth daily.   Yes [provider]  atorvastatin (LIPITOR) 20 MG tablet Take 20 mg by mouth daily.   Yes [provider]  Cholecalciferol (VITAMIN D-1000 MAX ST) 25 MCG (1000 UT) tablet Take 1,000 Units by mouth daily. 08/30/12  Yes [provider]  Cyanocobalamin (VITAMIN B-12 CR) 1000 MCG TBCR Take 2,000 mcg by mouth daily.  12/27/10  Yes [provider]  furosemide (LASIX) 40 MG tablet Take 40 mg by mouth daily as needed for edema. 02/19/19  Yes [provider]  gabapentin (NEURONTIN) 300 MG capsule Take 300 mg by mouth daily.   Yes [provider]  gabapentin (NEURONTIN) 300 MG capsule Take 1,200 mg by mouth at bedtime.   Yes [provider]  glimepiride (AMARYL) 2 MG tablet Take 2-4 mg by mouth 2 (two) times daily. 4 mg in the morning and 2 mg in the evening 07/19/18  Yes [provider]  hydrOXYzine (ATARAX/VISTARIL) 25 MG tablet Take 25-50 mg by mouth 2 (two) times daily. 25 mg in the  morning and 50 mg at bedtime 01/05/15  Yes [provider]  insulin aspart (NOVOLOG) 100 UNIT/ML injection Inject 2-15 Units into the skin 3 (three) times daily with meals. Per sliding scale 121-150= 2 units 151-200= 3 units 201-250= 5 units 251-300= 8 units 301-350= 11 units 351-400= 15 units   Yes [provider]  insulin glargine (LANTUS) 100 UNIT/ML injection Inject 5 Units into the skin daily.   Yes [provider]  lacosamide 100 MG TABS Take 1 tablet (100 mg total) by mouth 2 (two) times daily. 04/09/19  Yes Mayo, Pete Pelt, MD  metoprolol succinate (TOPROL-XL) 50 MG 24 hr tablet Take 50 mg by mouth 2 (two) times daily. 01/01/19  Yes [provider]  naphazoline-glycerin (CLEAR EYES REDNESS) 0.012-0.2 % SOLN Place 2 drops into both eyes daily. 04/09/19  Yes Mayo, Pete Pelt, MD  Nutritional Supplements (FEEDING SUPPLEMENT, NEPRO CARB STEADY,) LIQD Take 237 mLs by mouth 2 (two) times daily between meals. 04/09/19  Yes Mayo, Pete Pelt, MD  Omega-3 1000 MG CAPS Take 1,000 mg by mouth daily. 12/23/10  Yes [provider]  omeprazole (PRILOSEC) 20 MG capsule Take 20 mg by mouth daily.  12/19/14  Yes [provider]  PARoxetine (PAXIL) 20 MG tablet Take 1 tablet (20 mg total) by mouth daily. 06/18/18  Yes Gladstone Lighter, MD  pioglitazone (ACTOS) 30 MG tablet Take 30 mg by mouth daily. 01/23/19  Yes [provider]  polyethylene glycol (MIRALAX / GLYCOLAX) 17 g packet Take 17 g by mouth daily as needed for moderate constipation. 04/09/19  Yes Mayo, Pete Pelt, MD  potassium chloride (KLOR-CON) 10 MEQ tablet Take 10 mEq by mouth daily as needed (when taking furosemide).  02/19/19  Yes [provider]  tamsulosin (FLOMAX) 0.4 MG CAPS capsule Take 1 capsule (0.4 mg total) by mouth daily. 04/10/19  Yes Mayo, Pete Pelt, MD  nitroGLYCERIN (NITROSTAT) 0.4 MG SL tablet Take 0.4 mg by mouth every 5 (five) minutes as needed for chest pain.   07/24/14   [provider]    Allergies Naproxen sodium, Nsaids, Aleve [naproxen], and Ibuprofen  History reviewed. No pertinent family history.  Social History Social History   Tobacco Use  . Smoking status: Never Smoker  . Smokeless tobacco: Never Used  Substance Use Topics  . Alcohol use: No  . Drug use: No    Review of Systems  Review of Systems  Constitutional: Positive for fatigue. Negative for chills and fever.  HENT: Negative for sore throat.   Respiratory: Negative for shortness of breath.   Cardiovascular: Negative for chest pain.  Gastrointestinal: Negative for abdominal pain.  Genitourinary: Negative for flank pain.  Musculoskeletal: Negative for neck pain.  Skin: Positive for wound. Negative for rash.  Allergic/Immunologic: Negative for immunocompromised state.  Neurological: Negative for weakness and numbness.  Hematological: Does  not bruise/bleed easily.  Psychiatric/Behavioral: Positive for dysphoric mood.  All other systems reviewed and are negative.    ____________________________________________  PHYSICAL EXAM:      VITAL SIGNS: ED Triage Vitals  Enc Vitals Group     BP 04/12/19 1515 (!) 135/59     Pulse Rate 04/12/19 1515 66     Resp 04/12/19 1515 18     Temp 04/12/19 1515 97.9 F (36.6 C)     Temp Source 04/12/19 1515 Oral     SpO2 04/12/19 1515 94 %     Weight --      Height --      Head Circumference --      Peak Flow --      Pain Score 04/12/19 1507 0     Pain Loc --      Pain Edu? --      Excl. in [place]? --      Physical Exam Vitals signs and nursing note reviewed.  Constitutional:      General: He is not in acute distress.    Appearance: He is well-developed.  HENT:     Head: Normocephalic and atraumatic.  Eyes:     Conjunctiva/sclera: Conjunctivae normal.  Neck:     Musculoskeletal: Neck supple.  Cardiovascular:     Rate and Rhythm: Normal rate and regular rhythm.     Heart sounds: Normal heart sounds. No murmur.  No friction rub.  Pulmonary:     Effort: Pulmonary effort is normal. No respiratory distress.     Breath sounds: Normal breath sounds. No wheezing or rales.  Abdominal:     General: There is no distension.     Palpations: Abdomen is soft.     Tenderness: There is no abdominal tenderness.  Skin:    General: Skin is warm.     Capillary Refill: Capillary refill takes less than 2 seconds.     Comments: Superficial excoriation to the right volar wrist.  No deep laceration.  Neurological:     Mental Status: He is alert and oriented to person, place, and time.     Motor: No abnormal muscle tone.  Psychiatric:        Mood and Affect: Mood is depressed.       ____________________________________________   LABS (all labs ordered are listed, but only abnormal results are displayed)  Labs Reviewed  COMPREHENSIVE METABOLIC PANEL - Abnormal; Notable for the following components:      Result Value   Chloride 97 (*)    Glucose, Bld 188 (*)    Calcium 8.2 (*)    Total Protein 6.4 (*)    Albumin 2.6 (*)    AST 65 (*)    ALT 55 (*)    All other components within normal limits  ACETAMINOPHEN LEVEL - Abnormal; Notable for the following components:   Acetaminophen (Tylenol), Serum <10 (*)    All other components within normal limits  CBC - Abnormal; Notable for the following components:   WBC 2.5 (*)    RBC 3.07 (*)    Hemoglobin 7.4 (*)    HCT 23.6 (*)    MCV 76.9 (*)    MCH 24.1 (*)    RDW 22.4 (*)    nRBC 2.8 (*)    All other components within normal limits  URINE DRUG SCREEN, QUALITATIVE ([facility] ONLY) - Abnormal; Notable for the following components:   Barbiturates, Ur Screen POSITIVE (*)    Benzodiazepine, Ur Scrn POSITIVE (*)  All other components within normal limits  SARS CORONAVIRUS 2 BY RT PCR (HOSPITAL ORDER, [facility])  ETHANOL  SALICYLATE LEVEL  CBC    ____________________________________________  EKG:    ________________________________________  RADIOLOGY All imaging, including plain films, CT scans, and ultrasounds, independently reviewed by me, and interpretations confirmed via formal radiology reads.  ED MD interpretation:   None  Official radiology report(s): No results found.  ____________________________________________  PROCEDURES   Procedure(s) performed (including Critical Care):  Procedures  ____________________________________________  INITIAL IMPRESSION / MDM / [place] / ED COURSE  As part of my medical decision making, I reviewed the following data within the electronic MEDICAL RECORD NUMBER Notes from prior ED visits and [place] Controlled Substance Database      *DONTRE FASCIANO was evaluated in Emergency Department on 04/13/2019 for the symptoms described in the history of present illness. He was evaluated in the context of the global COVID-19 pandemic, which necessitated consideration that the patient might be at risk for infection with the SARS-CoV-2 virus that causes COVID-19. Institutional protocols and algorithms that pertain to the evaluation of patients at risk for COVID-19 are in a state of rapid change based on information released by regulatory bodies including the CDC and federal and state organizations. These policies and algorithms were followed during the patient's care in the ED.  Some ED evaluations and interventions may be delayed as a result of limited staffing during the pandemic.*      Medical Decision Making: 73 year old male here with suicidal ideation in setting of recent health stressors and going to a skilled nursing facility.  He has a superficial excoriation on his wrist, which can be dressed with local wound care.  No evidence of deep laceration.  Otherwise, screening lab work is not significantly changed from recent admission.  Of note, his hemoglobin does seem to be slowly downtrending.  He received an IV iron infusion as an inpatient.   I discussed the case with Dr. Grayland Ormond of hematology and oncology suspects this is due to his acute AML, and recommends transfusion threshold of hemoglobin greater than 7.  Will start on iron supplementation here, and obtain daily CBC, but per discussion with Heme-Onc acute, emergent transfusion not indicated at this time.  ____________________________________________  FINAL CLINICAL IMPRESSION(S) / ED DIAGNOSES  Final diagnoses:  Suicidal ideation  Iron deficiency anemia, unspecified iron deficiency anemia type  Acute myeloid leukemia not having achieved remission (HCC)     MEDICATIONS GIVEN DURING THIS VISIT:  Medications  naphazoline-glycerin (CLEAR EYES REDNESS) ophth solution 2 drop (has no administration in time range)  polyethylene glycol (MIRALAX / GLYCOLAX) packet 17 g (has no administration in time range)  feeding supplement (NEPRO CARB STEADY) liquid 237 mL (has no administration in time range)  lacosamide (VIMPAT) tablet 100 mg (100 mg Oral Given 04/12/19 2250)  apixaban (ELIQUIS) tablet 5 mg (5 mg Oral Given 04/12/19 2251)  gabapentin (NEURONTIN) capsule 1,200 mg (1,200 mg Oral Given 04/12/19 2250)  metoprolol succinate (TOPROL-XL) 24 hr tablet 50 mg (50 mg Oral Given 04/12/19 2251)  aspirin EC tablet 81 mg (has no administration in time range)  atorvastatin (LIPITOR) tablet 20 mg (20 mg Oral Given 04/12/19 1853)  furosemide (LASIX) tablet 40 mg (has no administration in time range)  gabapentin (NEURONTIN) capsule 300 mg (has no administration in time range)  glimepiride (AMARYL) tablet 2 mg (2 mg Oral Given 04/12/19 2251)  hydrOXYzine (ATARAX/VISTARIL) tablet 25 mg (25 mg Oral Given  04/12/19 2252)  omega-3 acid ethyl esters (LOVAZA) capsule 1,000 mg (has no administration in time range)  pantoprazole (PROTONIX) EC tablet 40 mg (has no administration in time range)  PARoxetine (PAXIL) tablet 20 mg (has no administration in time range)  pioglitazone (ACTOS) tablet 30 mg  (has no administration in time range)  tamsulosin (FLOMAX) capsule 0.4 mg (has no administration in time range)  vitamin B-12 (CYANOCOBALAMIN) tablet 2,000 mcg (has no administration in time range)  clonazePAM (KLONOPIN) tablet 0.5 mg (0.5 mg Oral Given 04/12/19 2249)  ferrous sulfate tablet 325 mg (325 mg Oral Given 04/12/19 1854)  HYDROcodone-acetaminophen (NORCO/VICODIN) 5-325 MG per tablet 1 tablet (1 tablet Oral Given 04/12/19 2252)  insulin aspart (novoLOG) injection 2-15 Units (has no administration in time range)  insulin glargine (LANTUS) injection 5 Units (has no administration in time range)  potassium chloride SA (KLOR-CON) CR tablet 10 mEq (has no administration in time range)     ED Discharge Orders    None       Note:  This document was prepared using Dragon voice recognition software and may include unintentional dictation errors.   Duffy Bruce, MD 04/13/19 Greig Right, MD 04/13/19 [phone]

## 2019-04-12 NOTE — BH Assessment (Signed)
Assessment Note  Mike [name] is an 73 y.o. male  Who presents to the ER from his facility, [facility], due to voicing SI and using a fingernail file to cut his wrist. Patient states he was recently diagnosed with cancer and he no longer have a reason to live. Per the report of the patient's sister ([name]), he recently found out he has cancer and he may have three to six months to live. It's predicated on whether or not he is able to have chemotherapy. She further explains, the patient has had a history of undiagnosed bipolar and severe mood swings. Since he been told he has cancer, the patient has been agitated and mean to the staff at the facility. He also has started voicing SI and this is the first time he has done this. Sister reports he's been in the facility for less than a week.  During the interview, the patient was cooperative and matter of fact when answering the questions. When asked how he was going to end his life, he stated he didn't know. Prior to the arrival to the ER, patient stated he was going to jump out of a window. Patient currently unable to walk and need assistance to perform some of his ADL's.  Patient denies HI and AV/H. He also denies the use of mind-altering substances. He have no history of violence or aggression.  Diagnosis: Major Depression  Past Medical History:  Past Medical History:  Diagnosis Date  . Anxiety   . Diabetes mellitus without complication ([place])   . Epilepsy ([place])   . High cholesterol   . Hypertension   . Prostate cancer [facility]) 2004   prostate removed    Past Surgical History:  Procedure Laterality Date  . CARDIAC SURGERY    . FLEXIBLE SIGMOIDOSCOPY N/A 04/04/2019   Procedure: FLEXIBLE SIGMOIDOSCOPY;  Surgeon: Virgel Manifold, MD;  Location: [facility] ENDOSCOPY;  Service: Endoscopy;  Laterality: N/A;  . IRRIGATION AND DEBRIDEMENT FOOT Right 06/14/2018   Procedure: IRRIGATION AND DEBRIDEMENT FOOT;  Surgeon: Sharlotte Alamo,  DPM;  Location: [facility] ORS;  Service: Podiatry;  Laterality: Right;    Family History: History reviewed. No pertinent family history.  Social History:  reports that he has never smoked. He has never used smokeless tobacco. He reports that he does not drink alcohol or use drugs.  Additional Social History:  Alcohol / Drug Use Pain Medications: See PTA Prescriptions: See PTA Over the Counter: See PTA History of alcohol / drug use?: No history of alcohol / drug abuse Longest period of sobriety (when/how long): n/a  CIWA: CIWA-Ar BP: (!) 135/59 Pulse Rate: 66 COWS:    Allergies:  Allergies  Allergen Reactions  . Naproxen Sodium Other (See Comments)    Reaction: Upset stomach  . Nsaids Other (See Comments)    Other reaction(s): Unknown  . Aleve [Naproxen] Nausea And Vomiting  . Ibuprofen Nausea Only    Reaction: Upset stomach     Home Medications: (Not in a hospital admission)   OB/GYN Status:  No LMP for male patient.  General Assessment Data Location of Assessment: [facility] ED TTS Assessment: In system Is this a Tele or Face-to-Face Assessment?: Face-to-Face Is this an Initial Assessment or a Re-assessment for this encounter?: Initial Assessment Patient Accompanied by:: N/A Language Other than English: No Living Arrangements: In Assisted Living/Nursing Home (Comment: Name of Nursing Home([facility]) What gender do you identify as?: Male Marital status: Single Pregnancy Status: No Living Arrangements: Other (Comment) Can pt return  to current living arrangement?: No Admission Status: Voluntary Is patient capable of signing voluntary admission?: No Referral Source: Self/Family/Friend Insurance type: Medicare A&B  Medical Screening Exam ([place]) Medical Exam completed: Yes  Crisis Care Plan Living Arrangements: Other (Comment) Legal Guardian: Other:(Self) Name of Psychiatrist: Reports of none Name of Therapist: Reports of none  Education Status Is  patient currently in school?: No Is the patient employed, unemployed or receiving disability?: Unemployed, Receiving disability income  Risk to self with the past 6 months Suicidal Ideation: Yes-Currently Present Has patient been a risk to self within the past 6 months prior to admission? : No Suicidal Intent: No-Not Currently/Within Last 6 Months Has patient had any suicidal intent within the past 6 months prior to admission? : No Is patient at risk for suicide?: No Suicidal Plan?: No Has patient had any suicidal plan within the past 6 months prior to admission? : No Access to Means: No What has been your use of drugs/alcohol within the last 12 months?: Reports of none Previous Attempts/Gestures: No How many times?: 0 Other Self Harm Risks: Reports of none Triggers for Past Attempts: None known Intentional Self Injurious Behavior: None Family Suicide History: No Recent stressful life event(s): Recent negative physical changes Persecutory voices/beliefs?: No Depression: Yes Depression Symptoms: Tearfulness, Isolating, Fatigue, Guilt, Loss of interest in usual pleasures, Feeling worthless/self pity, Feeling angry/irritable Substance abuse history and/or treatment for substance abuse?: No Suicide prevention information given to non-admitted patients: Not applicable  Risk to Others within the past 6 months Homicidal Ideation: No Does patient have any lifetime risk of violence toward others beyond the six months prior to admission? : No Thoughts of Harm to Others: No Current Homicidal Intent: No Current Homicidal Plan: No Access to Homicidal Means: No Identified Victim: Reports of none History of harm to others?: No Assessment of Violence: None Noted Violent Behavior Description: Reports of none Does patient have access to weapons?: No Criminal Charges Pending?: No Does patient have a court date: No Is patient on probation?: No  Psychosis Hallucinations: None noted Delusions:  None noted  Mental Status Report Appearance/Hygiene: Unremarkable, In scrubs Eye Contact: Fair Motor Activity: Unable to assess Speech: Logical/coherent, Unremarkable Level of Consciousness: Alert Mood: Depressed, Anxious, Pleasant Affect: Appropriate to circumstance, Sad Anxiety Level: Minimal Thought Processes: Coherent, Relevant Judgement: Unimpaired Orientation: Person, Place, Time, Situation, Appropriate for developmental age Obsessive Compulsive Thoughts/Behaviors: Minimal  Cognitive Functioning Concentration: Normal Memory: Recent Intact, Remote Intact Is patient IDD: No Insight: Fair Impulse Control: Fair Appetite: Good Have you had any weight changes? : No Change Sleep: No Change Total Hours of Sleep: 8 Vegetative Symptoms: None     Prior Inpatient Therapy Prior Inpatient Therapy: No  Prior Outpatient Therapy Prior Outpatient Therapy: No Does patient have an ACCT team?: No Does patient have Intensive In-House Services?  : No Does patient have [facility] services? : No Does patient have P4CC services?: No          Abuse/Neglect Assessment (Assessment to be complete while patient is alone) Abuse/Neglect Assessment Can Be Completed: Yes Physical Abuse: Denies Verbal Abuse: Denies Sexual Abuse: Denies Exploitation of patient/patient's resources: Denies Self-Neglect: Denies Values / Beliefs Cultural Requests During Hospitalization: None Spiritual Requests During Hospitalization: None Consults Spiritual Care Consult Needed: No Social Work Consult Needed: No Regulatory affairs officer (For Healthcare) Does Patient Have a Medical Advance Directive?: Yes Does patient want to make changes to medical advance directive?: Yes (ED - Information included in AVS) Type of Advance Directive: Out  of facility DNR (pink MOST or yellow form)       Child/Adolescent Assessment Running Away Risk: Denies(Patient is an adult)  Disposition:  Disposition Initial Assessment  Completed for this Encounter: Yes  On Site Evaluation by:   Reviewed with Physician:    Gunnar Fusi MS, LCAS, [facility], [place] Therapeutic Triage Specialist 04/12/2019 5:58 PM

## 2019-04-12 NOTE — BH Assessment (Signed)
Referral information for Psychiatric Hospitalization faxed to;   [facility] ([phone]---[phone]),  . [place] [phone] or [phone]),   . [facility] [phone]).

## 2019-04-12 NOTE — Consult Note (Signed)
Secretary Psychiatry Consult   Reason for Consult:  Suicide attempt Referring Physician:  EDP Patient Identification: Mike [name] MRN:  [id number] Principal Diagnosis: Major depressive disorder, recurrent severe without psychotic features ([place]) Diagnosis:  Principal Problem:   Major depressive disorder, recurrent severe without psychotic features ([place]) Active Problems:   AML (acute myeloblastic leukemia) ([place])   Total Time spent with patient: 1 hour  Subjective:   Mike [name] is a 73 y.o. male patient admitted with suicide attempt.  HPI: Patient seen and evaluated in person by this provider.  73 year old male who came to the emergency department after cutting wrist with a fingernail file at his skilled nursing facility.  He was recently discharged from the hospital and had to move to a SNF as he could no longer care for himself.  This week he did find out that he has AML and needs to get stronger before he can do chemotherapy.  If he does chemotherapy, he may can live a year.  Without chemotherapy his life expectancy is 3 months.  Patient desires to return home but according to his sisters and his inability to care for himself this is not possible.  Continues to endorse suicidal thoughts and plan to do whatever he can.  No homicidal thoughts or hallucinations or substance abuse.  His sister reports he has never had thoughts of self-harm before now and this is very much not like him.  Geriatric psych recommended.  Past Psychiatric History: depression, anxiety  Risk to Self:  yes Risk to Others:  none Prior Inpatient Therapy:  none Prior Outpatient Therapy:  none  Past Medical History:  Past Medical History:  Diagnosis Date  . Anxiety   . Diabetes mellitus without complication ([place])   . Epilepsy ([place])   . High cholesterol   . Hypertension   . Prostate cancer [facility]) 2004   prostate removed    Past Surgical History:  Procedure Laterality Date  . CARDIAC SURGERY    .  FLEXIBLE SIGMOIDOSCOPY N/A 04/04/2019   Procedure: FLEXIBLE SIGMOIDOSCOPY;  Surgeon: Virgel Manifold, MD;  Location: [facility] ENDOSCOPY;  Service: Endoscopy;  Laterality: N/A;  . IRRIGATION AND DEBRIDEMENT FOOT Right 06/14/2018   Procedure: IRRIGATION AND DEBRIDEMENT FOOT;  Surgeon: Sharlotte Alamo, DPM;  Location: [facility] ORS;  Service: Podiatry;  Laterality: Right;   Family History: History reviewed. No pertinent family history. Family Psychiatric  History: none  Social History:  Social History   Substance and Sexual Activity  Alcohol Use No     Social History   Substance and Sexual Activity  Drug Use No    Social History   Socioeconomic History  . Marital status: Single    Spouse name: Not on file  . Number of children: Not on file  . Years of education: Not on file  . Highest education level: Not on file  Occupational History  . Not on file  Social Needs  . Financial resource strain: Not on file  . Food insecurity    Worry: Not on file    Inability: Not on file  . Transportation needs    Medical: Not on file    Non-medical: Not on file  Tobacco Use  . Smoking status: Never Smoker  . Smokeless tobacco: Never Used  Substance and Sexual Activity  . Alcohol use: No  . Drug use: No  . Sexual activity: Not Currently  Lifestyle  . Physical activity    Days per week: Not on file  Minutes per session: Not on file  . Stress: Not on file  Relationships  . Social Herbalist on phone: Not on file    Gets together: Not on file    Attends religious service: Not on file    Active member of club or organization: Not on file    Attends meetings of clubs or organizations: Not on file    Relationship status: Not on file  Other Topics Concern  . Not on file  Social History Narrative  . Not on file   Additional Social History:    Allergies:   Allergies  Allergen Reactions  . Naproxen Sodium Other (See Comments)    Reaction: Upset stomach  . Nsaids Other (See  Comments)    Other reaction(s): Unknown  . Aleve [Naproxen] Nausea And Vomiting  . Ibuprofen Nausea Only    Reaction: Upset stomach     Labs:  Results for orders placed or performed during the hospital encounter of 04/12/19 (from the past 48 hour(s))  cbc     Status: Abnormal   Collection Time: 04/12/19  3:46 PM  Result Value Ref Range   WBC 2.5 (L) 4.0 - 10.5 K/uL   RBC 3.07 (L) 4.22 - 5.81 MIL/uL   Hemoglobin 7.4 (L) 13.0 - 17.0 g/dL    Comment: Reticulocyte Hemoglobin testing may be clinically indicated, consider ordering this additional test [id number]    HCT 23.6 (L) 39.0 - 52.0 %   MCV 76.9 (L) 80.0 - 100.0 fL   MCH 24.1 (L) 26.0 - 34.0 pg   MCHC 31.4 30.0 - 36.0 g/dL   RDW 22.4 (H) 11.5 - 15.5 %   Platelets 213 150 - 400 K/uL   nRBC 2.8 (H) 0.0 - 0.2 %    Comment: Performed at [facility], [address], [place], [place] [address]    Current Facility-Administered Medications  Medication Dose Route Frequency Provider Last Rate Last Dose  . apixaban (ELIQUIS) tablet 5 mg  5 mg Oral BID Patrecia Pour, NP      . Derrill Memo ON 04/13/2019] aspirin EC tablet 81 mg  81 mg Oral Daily Patrecia Pour, NP      . atorvastatin (LIPITOR) tablet 20 mg  20 mg Oral q1800 Patrecia Pour, NP      . clonazePAM Bobbye [place]) tablet 0.5 mg  0.5 mg Oral BID Patrecia Pour, NP      . Derrill Memo ON 04/13/2019] feeding supplement (NEPRO CARB STEADY) liquid 237 mL  237 mL Oral BID BM Patrecia Pour, NP      . Derrill Memo ON 04/13/2019] furosemide (LASIX) tablet 40 mg  40 mg Oral Daily PRN Patrecia Pour, NP      . gabapentin (NEURONTIN) capsule 1,200 mg  1,200 mg Oral QHS Patrecia Pour, NP      . Derrill Memo ON 04/13/2019] gabapentin (NEURONTIN) capsule 300 mg  300 mg Oral Daily Lord, Jamison Y, NP      . glimepiride (AMARYL) tablet 2 mg  2 mg Oral BID Patrecia Pour, NP      . hydrOXYzine (ATARAX/VISTARIL) tablet 25 mg  25 mg Oral BID Patrecia Pour, NP      . Lacosamide TABS 100 mg  100 mg  Oral BID Patrecia Pour, NP      . metoprolol succinate (TOPROL-XL) 24 hr tablet 50 mg  50 mg Oral BID Patrecia Pour, NP      . naphazoline-glycerin (CLEAR EYES  REDNESS) ophth solution 2 drop  2 drop Both Eyes Daily Patrecia Pour, NP      . Derrill Memo ON 04/13/2019] Omega-3 CAPS 1,000 mg  1,000 mg Oral Daily Patrecia Pour, NP      . Derrill Memo ON 04/13/2019] pantoprazole (PROTONIX) EC tablet 40 mg  40 mg Oral Daily Patrecia Pour, NP      . Derrill Memo ON 04/13/2019] PARoxetine (PAXIL) tablet 20 mg  20 mg Oral Daily Patrecia Pour, NP      . Derrill Memo ON 04/13/2019] pioglitazone (ACTOS) tablet 30 mg  30 mg Oral Daily Lord, Jamison Y, NP      . polyethylene glycol (MIRALAX / GLYCOLAX) packet 17 g  17 g Oral Daily PRN Patrecia Pour, NP      . Derrill Memo ON 04/13/2019] tamsulosin (FLOMAX) capsule 0.4 mg  0.4 mg Oral Daily Patrecia Pour, NP      . Derrill Memo ON 04/13/2019] Vitamin B-12 CR TBCR 2,000 mcg  2,000 mcg Oral Daily Patrecia Pour, NP       Current Outpatient Medications  Medication Sig Dispense Refill  . acetaminophen-codeine (TYLENOL #3) 300-30 MG tablet Take 1 tablet by mouth every 8 (eight) hours as needed for moderate pain. 20 tablet 0  . ALPRAZolam (XANAX) 0.5 MG tablet Take 0.5-1 mg by mouth 2 (two) times daily. 0.5 mg in the morning and 1 mg at bedtime    . apixaban (ELIQUIS) 5 MG TABS tablet Take 2 tablets (10mg ) by mouth twice a day for 6 more days, then take 1 tablet (5mg ) by mouth twice a day 60 tablet 0  . aspirin EC 81 MG tablet Take 81 mg by mouth daily.    Marland Kitchen atorvastatin (LIPITOR) 20 MG tablet Take 20 mg by mouth daily.    . Cholecalciferol (VITAMIN D-1000 MAX ST) 25 MCG (1000 UT) tablet Take 1,000 Units by mouth daily.    . Cyanocobalamin (VITAMIN B-12 CR) 1000 MCG TBCR Take 2,000 mcg by mouth daily.     . furosemide (LASIX) 40 MG tablet Take 40 mg by mouth daily as needed for edema.    . gabapentin (NEURONTIN) 300 MG capsule Take 300 mg by mouth daily.    Marland Kitchen gabapentin (NEURONTIN)  300 MG capsule Take 1,200 mg by mouth at bedtime.    Marland Kitchen glimepiride (AMARYL) 2 MG tablet Take 2-4 mg by mouth 2 (two) times daily. 4 mg in the morning and 2 mg in the evening    . hydrOXYzine (ATARAX/VISTARIL) 25 MG tablet Take 25-50 mg by mouth 2 (two) times daily. 25 mg in the morning and 50 mg at bedtime    . lacosamide 100 MG TABS Take 1 tablet (100 mg total) by mouth 2 (two) times daily. 60 tablet 0  . metoprolol succinate (TOPROL-XL) 50 MG 24 hr tablet Take 50 mg by mouth 2 (two) times daily.    . naphazoline-glycerin (CLEAR EYES REDNESS) 0.012-0.2 % SOLN Place 2 drops into both eyes daily. 30 mL 0  . nitroGLYCERIN (NITROSTAT) 0.4 MG SL tablet Take 0.4 mg by mouth every 5 (five) minutes as needed for chest pain.     . Nutritional Supplements (FEEDING SUPPLEMENT, NEPRO CARB STEADY,) LIQD Take 237 mLs by mouth 2 (two) times daily between meals. 1000 mL 0  . Omega-3 1000 MG CAPS Take 1,000 mg by mouth daily.    Marland Kitchen omeprazole (PRILOSEC) 20 MG capsule Take 20 mg by mouth daily.     Marland Kitchen PARoxetine (PAXIL) 20  MG tablet Take 1 tablet (20 mg total) by mouth daily. 30 tablet 1  . pioglitazone (ACTOS) 30 MG tablet Take 30 mg by mouth daily.    . polyethylene glycol (MIRALAX / GLYCOLAX) 17 g packet Take 17 g by mouth daily as needed for moderate constipation. 14 each 0  . potassium chloride (KLOR-CON) 10 MEQ tablet Take 10 mEq by mouth daily as needed (when taking furosemide).     . tamsulosin (FLOMAX) 0.4 MG CAPS capsule Take 1 capsule (0.4 mg total) by mouth daily. 30 capsule 0    Musculoskeletal: Strength & Muscle Tone: decreased Gait & Station: unable to stand Patient leans: N/A  Psychiatric Specialty Exam: Physical Exam  Nursing note and vitals reviewed. Constitutional: He is oriented to person, place, and time. He appears well-developed and well-nourished.  HENT:  Head: Normocephalic.  Neck: Normal range of motion.  Respiratory: Effort normal.  Neurological: He is alert and oriented to  person, place, and time.  Psychiatric: His speech is normal and behavior is normal. His mood appears anxious. His affect is blunt. Cognition and memory are normal. He expresses impulsivity. He exhibits a depressed mood. He expresses suicidal ideation. He expresses suicidal plans.    Review of Systems  Musculoskeletal: Positive for back pain and joint pain.  Psychiatric/Behavioral: Positive for depression and suicidal ideas. The patient is nervous/anxious.   All other systems reviewed and are negative.   Blood pressure (!) 135/59, pulse 66, temperature 97.9 F (36.6 C), temperature source Oral, resp. rate 18, SpO2 94 %.There is no height or weight on file to calculate BMI.  General Appearance: Casual  Eye Contact:  Fair  Speech:  Normal Rate  Volume:  Normal  Mood:  Anxious and Depressed  Affect:  Congruent  Thought Process:  Coherent and Descriptions of Associations: Intact  Orientation:  Full (Time, Place, and Person)  Thought Content:  Rumination  Suicidal Thoughts:  Yes.  with intent/plan  Homicidal Thoughts:  No  Memory:  Immediate;   Fair Recent;   Fair Remote;   Fair  Judgement:  Poor  Insight:  Fair  Psychomotor Activity:  Decreased  Concentration:  Concentration: Fair and Attention Span: Fair  Recall:  [employer] of Knowledge:  Fair  Language:  Fair  Akathisia:  No  Handed:  Right  AIMS (if indicated):     Assets:  Leisure Time Resilience Social Support  ADL's:  Impaired  Cognition:  WNL  Sleep:      73 year old male with history of depression and anxiety.  His depression has a increase greatly since recent hospitalization and discovery of AML cancer.  Attempted to cut his wrist at his nursing facility and continues to endorse suicidal thoughts along with feelings of hopelessness, helplessness, and worthlessness.  No homicidal ideations, hallucinations, or subs abuse.  Treatment Plan Summary: Daily contact with patient to assess and evaluate symptoms and progress  in treatment, Medication management and Plan Major depressive disorder, recurrent, severe without psychosis:  -Continue Paxil 20 mg daily -Seek geriatric psychiatric hospitalization  Anxiety: -Change Xanax twice daily to Klonopin 0.5 mg twice daily  Disposition: Recommend psychiatric Inpatient admission when medically cleared.  Waylan Boga, NP 04/12/2019 4:27 PM

## 2019-04-12 NOTE — ED Notes (Addendum)
Pt belongings include cell phone and a charger.

## 2019-04-12 NOTE — ED Notes (Signed)
Pt offered dinner tray, and refused dinner trey.

## 2019-04-12 NOTE — ED Triage Notes (Signed)
Pt to ED by EMS from [facility]. Pt attempted to cut right wrist with a finger nail file. Upon arrival pt states he has SI and if he has to go back to [facility] he will jump out the window.

## 2019-04-13 DIAGNOSIS — D509 Iron deficiency anemia, unspecified: Secondary | ICD-10-CM | POA: Diagnosis not present

## 2019-04-13 DIAGNOSIS — F332 Major depressive disorder, recurrent severe without psychotic features: Secondary | ICD-10-CM | POA: Diagnosis not present

## 2019-04-13 LAB — CBC
HCT: 25.1 % — ABNORMAL LOW (ref 39.0–52.0)
Hemoglobin: 7.7 g/dL — ABNORMAL LOW (ref 13.0–17.0)
MCH: 23.8 pg — ABNORMAL LOW (ref 26.0–34.0)
MCHC: 30.7 g/dL (ref 30.0–36.0)
MCV: 77.5 fL — ABNORMAL LOW (ref 80.0–100.0)
Platelets: 208 10*3/uL (ref 150–400)
RBC: 3.24 MIL/uL — ABNORMAL LOW (ref 4.22–5.81)
RDW: 22.7 % — ABNORMAL HIGH (ref 11.5–15.5)
WBC: 2.7 10*3/uL — ABNORMAL LOW (ref 4.0–10.5)
nRBC: 2.6 % — ABNORMAL HIGH (ref 0.0–0.2)

## 2019-04-13 LAB — GLUCOSE, CAPILLARY: Glucose-Capillary: 93 mg/dL (ref 70–99)

## 2019-04-13 MED ORDER — INSULIN ASPART 100 UNIT/ML ~~LOC~~ SOLN: 2.0000 [IU] | Freq: Three times a day (TID) | SUBCUTANEOUS | Status: DC

## 2019-04-13 MED ORDER — FERROUS SULFATE 325 (65 FE) MG PO TABS: 325.0000 mg | ORAL_TABLET | Freq: Two times a day (BID) | ORAL | 3 refills | Status: AC

## 2019-04-13 MED ORDER — CLONAZEPAM 0.5 MG PO TABS: 0.5000 mg | ORAL_TABLET | Freq: Two times a day (BID) | ORAL | 0 refills | Status: AC

## 2019-04-13 NOTE — ED Notes (Signed)
Breakfast tray at bedside 

## 2019-04-13 NOTE — ED Notes (Addendum)
Pt unable to sign. [facility] called for d/c instructions. Belongings of cell phone and charger returned to pt.

## 2019-04-13 NOTE — BH Assessment (Signed)
Writer spoke with patient to complete an updated/reassessment. Patient denies SI/HI and AV/H. Patient in agreement with returning to the facility.  Writer called and spoke with the patient's nurse at [facility] ([phone]). Patient is able to return. They do not have transportation on the weekends, he will need to transport back via EMS. As far as follow up for psychiatric services, they have someone contracted with their facility and they are able to have the patient connect with them for ongoing treatment.  Writer updated patient's nurse Yong Channel).

## 2019-04-13 NOTE — Consult Note (Signed)
[facility] ED Discharge  04/13/2019 10:33 AM Mike [name]  MRN:  [id number] Principal Problem: Major depressive disorder, recurrent severe without psychotic features [facility]) Discharge Diagnoses: Principal Problem:   Major depressive disorder, recurrent severe without psychotic features ([place]) Active Problems:   AML (acute myeloblastic leukemia) ([place])  Subjective: "I'm doing better."  Denies suicidal/homicidal ideations, hallucinations, and substance abuse.  Patient seen and evaluated in person by this provider.  Yesterday he came in upset as he did not not want to be at his nursing home or any nursing home.  He has been depressed since he recently moved to a nursing home and having a diagnosis of AML.  Today he is calm and slept through the night.  Patient denies suicidal ideations today and agreeable to return to his SNF.  Explained to the patient that he would need to return to his nursing facility so they can continue his care.  Patient is stable to return to [facility].  Total Time spent with patient: 30 minutes  Past Psychiatric History: Depression, anxiety  Past Medical History:  Past Medical History:  Diagnosis Date  . Anxiety   . Diabetes mellitus without complication ([place])   . Epilepsy ([place])   . High cholesterol   . Hypertension   . Prostate cancer [facility]) 2004   prostate removed    Past Surgical History:  Procedure Laterality Date  . CARDIAC SURGERY    . FLEXIBLE SIGMOIDOSCOPY N/A 04/04/2019   Procedure: FLEXIBLE SIGMOIDOSCOPY;  Surgeon: Virgel Manifold, MD;  Location: [facility] ENDOSCOPY;  Service: Endoscopy;  Laterality: N/A;  . IRRIGATION AND DEBRIDEMENT FOOT Right 06/14/2018   Procedure: IRRIGATION AND DEBRIDEMENT FOOT;  Surgeon: Sharlotte Alamo, DPM;  Location: [facility] ORS;  Service: Podiatry;  Laterality: Right;   Family History: History reviewed. No pertinent family history. Family Psychiatric  History: See above Social History:  Social History   Substance and  Sexual Activity  Alcohol Use No     Social History   Substance and Sexual Activity  Drug Use No    Social History   Socioeconomic History  . Marital status: Single    Spouse name: Not on file  . Number of children: Not on file  . Years of education: Not on file  . Highest education level: Not on file  Occupational History  . Not on file  Social Needs  . Financial resource strain: Not on file  . Food insecurity    Worry: Not on file    Inability: Not on file  . Transportation needs    Medical: Not on file    Non-medical: Not on file  Tobacco Use  . Smoking status: Never Smoker  . Smokeless tobacco: Never Used  Substance and Sexual Activity  . Alcohol use: No  . Drug use: No  . Sexual activity: Not Currently  Lifestyle  . Physical activity    Days per week: Not on file    Minutes per session: Not on file  . Stress: Not on file  Relationships  . Social Herbalist on phone: Not on file    Gets together: Not on file    Attends religious service: Not on file    Active member of club or organization: Not on file    Attends meetings of clubs or organizations: Not on file    Relationship status: Not on file  Other Topics Concern  . Not on file  Social History Narrative  . Not on file  Has this patient used any form of tobacco in the last 30 days? (Cigarettes, Smokeless Tobacco, Cigars, and/or Pipes) NA  Current Medications: Current Facility-Administered Medications  Medication Dose Route Frequency Provider Last Rate Last Dose  . apixaban (ELIQUIS) tablet 5 mg  5 mg Oral BID Patrecia Pour, NP   5 mg at 04/13/19 [id number]  . aspirin EC tablet 81 mg  81 mg Oral Daily Patrecia Pour, NP   81 mg at 04/13/19 [id number]  . atorvastatin (LIPITOR) tablet 20 mg  20 mg Oral q1800 Patrecia Pour, NP   20 mg at 04/12/19 1853  . clonazePAM (KLONOPIN) tablet 0.5 mg  0.5 mg Oral BID Patrecia Pour, NP   0.5 mg at 04/13/19 [id number]  . feeding supplement (NEPRO CARB STEADY) liquid  237 mL  237 mL Oral BID BM Patrecia Pour, NP   Stopped at 04/13/19 [phone]  . ferrous sulfate tablet 325 mg  325 mg Oral BID WC Duffy Bruce, MD   325 mg at 04/13/19 0930  . furosemide (LASIX) tablet 40 mg  40 mg Oral Daily PRN Patrecia Pour, NP      . gabapentin (NEURONTIN) capsule 1,200 mg  1,200 mg Oral QHS Patrecia Pour, NP   1,200 mg at 04/12/19 2250  . gabapentin (NEURONTIN) capsule 300 mg  300 mg Oral Daily Patrecia Pour, NP   300 mg at 04/13/19 [id number]  . glimepiride (AMARYL) tablet 2 mg  2 mg Oral BID Patrecia Pour, NP   2 mg at 04/13/19 0935  . HYDROcodone-acetaminophen (NORCO/VICODIN) 5-325 MG per tablet 1 tablet  1 tablet Oral Q6H PRN Duffy Bruce, MD   1 tablet at 04/12/19 2252  . hydrOXYzine (ATARAX/VISTARIL) tablet 25 mg  25 mg Oral BID Patrecia Pour, NP   25 mg at 04/13/19 [id number]  . insulin aspart (novoLOG) injection 2-15 Units  2-15 Units Subcutaneous TID WC Vanessa [place], MD      . insulin glargine (LANTUS) injection 5 Units  5 Units Subcutaneous Daily Duffy Bruce, MD   5 Units at 04/13/19 [phone]  . lacosamide (VIMPAT) tablet 100 mg  100 mg Oral BID Patrecia Pour, NP   100 mg at 04/13/19 [id number]  . metoprolol succinate (TOPROL-XL) 24 hr tablet 50 mg  50 mg Oral BID Patrecia Pour, NP   50 mg at 04/13/19 [id number]  . naphazoline-glycerin (CLEAR EYES REDNESS) ophth solution 2 drop  2 drop Both Eyes Daily Patrecia Pour, NP   Stopped at 04/13/19 [phone]  . omega-3 acid ethyl esters (LOVAZA) capsule 1,000 mg  1,000 mg Oral Daily Patrecia Pour, NP   1,000 mg at 04/13/19 0934  . pantoprazole (PROTONIX) EC tablet 40 mg  40 mg Oral Daily Patrecia Pour, NP   40 mg at 04/13/19 [id number]  . PARoxetine (PAXIL) tablet 20 mg  20 mg Oral Daily Waylan Boga Y, NP      . pioglitazone (ACTOS) tablet 30 mg  30 mg Oral Daily Patrecia Pour, NP   30 mg at 04/13/19 0935  . polyethylene glycol (MIRALAX / GLYCOLAX) packet 17 g  17 g Oral Daily PRN Patrecia Pour, NP      . potassium chloride SA  (KLOR-CON) CR tablet 10 mEq  10 mEq Oral Daily PRN Duffy Bruce, MD      . tamsulosin [facility]) capsule 0.4 mg  0.4 mg Oral Daily Patrecia Pour, NP   0.4 mg at 04/13/19  0929  . vitamin B-12 (CYANOCOBALAMIN) tablet 2,000 mcg  2,000 mcg Oral Daily Patrecia Pour, NP   2,000 mcg at 04/13/19 [id number]   Current Outpatient Medications  Medication Sig Dispense Refill  . acetaminophen-codeine (TYLENOL #3) 300-30 MG tablet Take 1 tablet by mouth every 8 (eight) hours as needed for moderate pain. 20 tablet 0  . ALPRAZolam (XANAX) 0.5 MG tablet Take 0.5-1 mg by mouth 2 (two) times daily. 0.5 mg in the morning and 1 mg at bedtime    . apixaban (ELIQUIS) 5 MG TABS tablet Take 2 tablets (10mg ) by mouth twice a day for 6 more days, then take 1 tablet (5mg ) by mouth twice a day 60 tablet 0  . aspirin EC 81 MG tablet Take 81 mg by mouth daily.    Marland Kitchen atorvastatin (LIPITOR) 20 MG tablet Take 20 mg by mouth daily.    . Cholecalciferol (VITAMIN D-1000 MAX ST) 25 MCG (1000 UT) tablet Take 1,000 Units by mouth daily.    . Cyanocobalamin (VITAMIN B-12 CR) 1000 MCG TBCR Take 2,000 mcg by mouth daily.     . furosemide (LASIX) 40 MG tablet Take 40 mg by mouth daily as needed for edema.    . gabapentin (NEURONTIN) 300 MG capsule Take 300 mg by mouth daily.    Marland Kitchen gabapentin (NEURONTIN) 300 MG capsule Take 1,200 mg by mouth at bedtime.    Marland Kitchen glimepiride (AMARYL) 2 MG tablet Take 2-4 mg by mouth 2 (two) times daily. 4 mg in the morning and 2 mg in the evening    . hydrOXYzine (ATARAX/VISTARIL) 25 MG tablet Take 25-50 mg by mouth 2 (two) times daily. 25 mg in the morning and 50 mg at bedtime    . insulin aspart (NOVOLOG) 100 UNIT/ML injection Inject 2-15 Units into the skin 3 (three) times daily with meals. Per sliding scale 121-150= 2 units 151-200= 3 units 201-250= 5 units 251-300= 8 units 301-350= 11 units 351-400= 15 units    . insulin glargine (LANTUS) 100 UNIT/ML injection Inject 5 Units into the skin daily.    Marland Kitchen  lacosamide 100 MG TABS Take 1 tablet (100 mg total) by mouth 2 (two) times daily. 60 tablet 0  . metoprolol succinate (TOPROL-XL) 50 MG 24 hr tablet Take 50 mg by mouth 2 (two) times daily.    . naphazoline-glycerin (CLEAR EYES REDNESS) 0.012-0.2 % SOLN Place 2 drops into both eyes daily. 30 mL 0  . Nutritional Supplements (FEEDING SUPPLEMENT, NEPRO CARB STEADY,) LIQD Take 237 mLs by mouth 2 (two) times daily between meals. 1000 mL 0  . Omega-3 1000 MG CAPS Take 1,000 mg by mouth daily.    Marland Kitchen omeprazole (PRILOSEC) 20 MG capsule Take 20 mg by mouth daily.     Marland Kitchen PARoxetine (PAXIL) 20 MG tablet Take 1 tablet (20 mg total) by mouth daily. 30 tablet 1  . pioglitazone (ACTOS) 30 MG tablet Take 30 mg by mouth daily.    . polyethylene glycol (MIRALAX / GLYCOLAX) 17 g packet Take 17 g by mouth daily as needed for moderate constipation. 14 each 0  . potassium chloride (KLOR-CON) 10 MEQ tablet Take 10 mEq by mouth daily as needed (when taking furosemide).     . tamsulosin (FLOMAX) 0.4 MG CAPS capsule Take 1 capsule (0.4 mg total) by mouth daily. 30 capsule 0  . nitroGLYCERIN (NITROSTAT) 0.4 MG SL tablet Take 0.4 mg by mouth every 5 (five) minutes as needed for chest pain.  PTA Medications: (Not in a hospital admission)   Musculoskeletal: Strength & Muscle Tone: decreased Gait & Station: unable to stand Patient leans: N/A  Psychiatric Specialty Exam: Physical Exam  Nursing note and vitals reviewed. Constitutional: He is oriented to person, place, and time. He appears well-developed and well-nourished.  HENT:  Head: Normocephalic.  Neck: Normal range of motion.  Respiratory: Effort normal.  Musculoskeletal: Normal range of motion.  Neurological: He is alert and oriented to person, place, and time.  Psychiatric: His speech is normal and behavior is normal. Judgment and thought content normal. His mood appears anxious. Cognition and memory are normal. He exhibits a depressed mood.    Review of  Systems  Psychiatric/Behavioral: Positive for depression. The patient is nervous/anxious.   All other systems reviewed and are negative.   Blood pressure (!) 158/73, pulse 72, temperature 97.9 F (36.6 C), temperature source Oral, resp. rate 17, SpO2 100 %.There is no height or weight on file to calculate BMI.  General Appearance: Casual  Eye Contact:  Good  Speech:  Normal Rate  Volume:  Normal  Mood:  Anxious and Depressed  Affect:  Congruent  Thought Process:  Coherent and Descriptions of Associations: Intact  Orientation:  Full (Time, Place, and Person)  Thought Content:  WDL and Logical  Suicidal Thoughts:  No  Homicidal Thoughts:  No  Memory:  Immediate;   Good Recent;   Good Remote;   Good  Judgement:  Fair  Insight:  Fair  Psychomotor Activity:  Decreased   Concentration:  Concentration: Fair and Attention Span: Fair  Recall:  Good  Fund of Knowledge:  Good  Language:  Good  Akathisia:  No  Handed:  Right  AIMS (if indicated):     Assets:  Leisure Time Resilience Social Support  ADL's:  Impaired  Cognition:  WNL  Sleep:        Demographic Factors:  Male, Age 49 or older and Caucasian  Loss Factors: Decline in physical health  Historical Factors: NA  Risk Reduction Factors:   Sense of responsibility to family and Positive social support  Continued Clinical Symptoms:  Depression and anxiety  Cognitive Features That Contribute To Risk:  None    Suicide Risk:  Minimal: No identifiable suicidal ideation.  Patients presenting with no risk factors but with morbid ruminations; may be classified as minimal risk based on the severity of the depressive symptoms   Plan Of Care/Follow-up recommendations:  Major depressive disorder recurrent, moderate: -Continue Paxil 20 mg daily  Anxiety: -Continue hydroxyzine 25 mg in the morning and 50 mg in the evening -Continue Klonopin 0.5 mg twice daily Activity:  as tolerated Diet:  heart healthy  diet  Disposition: discharge back to SNF Waylan Boga, NP 04/13/2019, 10:33 AM

## 2019-04-13 NOTE — Discharge Instructions (Signed)
Preventing Iron Deficiency Anemia, Adult  Iron deficiency is having a lack of iron in the body. Iron is an important mineral that your body needs to build healthy red blood cells. Iron deficiency anemia is a condition in which the concentration of red blood cells or hemoglobin in the blood is below normal because of too little iron. Hemoglobin is a substance in red blood cells that carries oxygen to the body's tissues. You may develop iron deficiency anemia due to:  Blood loss from an injury or condition such as Crohn's disease.  Your body being unable to properly absorb iron and use it to create red blood cells.  Lack of iron in your diet. You can prevent iron deficiency anemia by making certain changes to your diet and lifestyle. What nutrition changes can be made?  Eat foods that are high in iron, such as: ? Red meat, especially liver and beef. ? Poultry. ? Seafood. ? Dried fruit. ? Prune juice. ? Nuts. ? Pumpkin seeds. ? Beans. ? Leafy green vegetables. ? Molasses. ? Tofu.  Look for foods that have added iron (are fortified). Many cereals and breads are iron fortified.  Eat foods that contain vitamin C along with iron-rich foods, preferably in the same meal. Vitamin C increases your body's ability to absorb iron. Foods high in vitamin C include: ? Citrus fruits, such as lemons, oranges, and grapefruits. ? Berries. ? Bell peppers. ? Tomatoes. ? Broccoli.  Do not follow a diet that is very low in fat or very high in fiber.  Do not drink very large amounts of milk, tea, or coffee. What actions can I take to lower my risk? It is important to know whether you are at risk for iron deficiency anemia. Ask your health care provider if you need a blood test to measure your iron or red blood cells. You may have a higher risk for iron deficiency anemia if:  You are a woman and one of the following applies: ? You have heavy menstrual periods. ? You are pregnant. ? You are  breastfeeding.  You have had bypass surgery for weight loss.  You have a digestive disorder such as Crohn's disease, irritable bowel syndrome, or celiac disease.  You regularly take antacids or acid-lowering medicines.  You follow a vegetarian or vegan diet. To lower your risk for iron deficiency:  If you are a vegetarian or vegan, talk with your health care provider or a dietitian about: ? Taking an iron supplement. ? Adding more iron-rich foods to your diet.  Limit your use of antacids or acid-lowering medicines.  If you have heavy menstrual periods, are pregnant, or are breastfeeding, ask your health care provider about taking an iron supplement.  Work with your health care provider to manage conditions that can cause iron deficiency.  Take over-the-counter and prescription medicines only as told by your health care provider.  Keep all follow-up visits as told by your health care provider. This is important. Why are these changes important? It is important to make these changes so that you do not develop iron deficiency anemia. Iron-rich foods help your body produce more red blood cells and have more energy. What can happen if changes are not made? If you do not make these changes, you could develop iron deficiency anemia. If not treated, iron deficiency anemia can lead to serious health complications, including:  Long-term (chronic) fatigue.  Shortness of breath.  Abnormal heart rhythms.  Heart failure.  Uncomfortable sensations and an overwhelming urge  to move your legs (restless legs syndrome).  Weakened disease-fighting system (immune system). Where to find more information Learn more about preventing iron deficiency from:  [place], Lung, and [place]: https://wilson-eaton.com/  American Society of Hematology: www.hematology.org Contact a health care provider if you:  Develop symptoms of iron deficiency, including: ? Fatigue. ? Headache. ? Pale skin,  lips, and nail beds. ? Poor appetite. ? Weakness. Summary  Iron deficiency anemia is a condition in which the concentration of red blood cells or hemoglobin in the blood is below normal because of too little iron.  You can help prevent iron deficiency anemia by eating more iron-rich foods, such as red meat, poultry, or tofu. Look for foods that have added iron (are fortified), such as cereals.  Eat foods that contain vitamin C along with iron-rich foods, preferably in the same meal. Vitamin C increases the body's ability to absorb iron.  Ask your health care provider about your risk for iron deficiency anemia and whether a multivitamin or supplement may be right for you. This information is not intended to replace advice given to you by your health care provider. Make sure you discuss any questions you have with your health care provider. Document Released: 04/05/2017 Document Revised: 09/27/2018 Document Reviewed: 04/05/2017 Elsevier Patient Education  [place].   Suicidal Feelings: How to Help Yourself Suicide is when you end your own life. There are many things you can do to help yourself feel better when struggling with these feelings. Many services and people are available to support you and others who struggle with similar feelings.  If you ever feel like you may hurt yourself or others, or have thoughts about taking your own life, get help right away. To get help:  Call your local emergency services (911 in the [place]  [facility] [place] [address]'s health and human services helpline (211 in the [place]  Go to your nearest emergency department.  Call a suicide hotline to speak with a trained counselor. The following suicide hotlines are available in the [place]: ? 1-800-273-TALK 717-539-3192). ? 1-800-SUICIDE 601-638-1237). ? 202-710-1303. This is a hotline for Spanish speakers. ? 7253730016. This is a hotline for TTY users. ? 1-866-4-U-TREVOR 331 275 0192).  This is a hotline for lesbian, gay, bisexual, transgender, or questioning youth. ? For a list of hotlines in [place], visit ParkingAffiliatePrograms.se.html  Contact a crisis center or a local suicide prevention center. To find a crisis center or suicide prevention center: ? Call your local hospital, clinic, community service organization, mental health center, social service provider, or health department. Ask for help with connecting to a crisis center. ? For a list of crisis centers in the [place], visit: suicidepreventionlifeline.org ? For a list of crisis centers in [place], visit: suicideprevention.ca How to help yourself feel better   Promise yourself that you will not do anything extreme when you have suicidal feelings. Remember, there is hope. Many people have gotten through suicidal thoughts and feelings, and you can too. If you have had these feelings before, remind yourself that you can get through them again.  Let family, friends, teachers, or counselors know how you are feeling. Try not to separate yourself from those who care about you and want to help you. Talk with someone every day, even if you do not feel sociable. Face-to-face conversation is best to help them understand your feelings.  Contact a mental health care provider and work with this person regularly.  Make a safety plan that you can follow  during a crisis. Include phone numbers of suicide prevention hotlines, mental health professionals, and trusted friends and family members you can call during an emergency. Save these numbers on your phone.  If you are thinking of taking a lot of medicine, give your medicine to someone who can give it to you as prescribed. If you are on antidepressants and are concerned you will overdose, tell your health care provider so that he or she can give you safer medicines.  Try to stick to your routines. Follow a schedule every day. Make self-care a  priority.  Make a list of realistic goals, and cross them off when you achieve them. Accomplishments can give you a sense of worth.  Wait until you are feeling better before doing things that you find difficult or unpleasant.  Do things that you have always enjoyed to take your mind off your feelings. Try reading a book, or listening to or playing music. Spending time outside, in nature, may help you feel better. Follow these instructions at home:   Visit your primary health care provider every year for a checkup.  Work with a mental health care provider as needed.  Eat a well-balanced diet, and eat regular meals.  Get plenty of rest.  Exercise if you are able. Just 30 minutes of exercise each day can help you feel better.  Take over-the-counter and prescription medicines only as told by your health care provider. Ask your mental health care provider about the possible side effects of any medicines you are taking.  Do not use alcohol or drugs, and remove these substances from your home.  Remove weapons, poisons, knives, and other deadly items from your home. General recommendations  Keep your living space well lit.  When you are feeling well, write yourself a letter with tips and support that you can read when you are not feeling well.  Remember that life's difficulties can be sorted out with help. Conditions can be treated, and you can learn behaviors and ways of thinking that will help you. Where to find more information  National Suicide Prevention Lifeline: www.suicidepreventionlifeline.org  Hopeline: www.hopeline.[place] for Suicide Prevention: PromotionalLoans.co.za  [employer] (for lesbian, gay, bisexual, transgender, or questioning youth): www.thetrevorproject.org Contact a health care provider if:  You feel as though you are a burden to others.  You feel agitated, angry, vengeful, or have extreme mood swings.  You have withdrawn from family and  friends. Get help right away if:  You are talking about suicide or wishing to die.  You start making plans for how to commit suicide.  You feel that you have no reason to live.  You start making plans for putting your affairs in order, saying goodbye, or giving your possessions away.  You feel guilt, shame, or unbearable pain, and it seems like there is no way out.  You are frequently using drugs or alcohol.  You are engaging in risky behaviors that could lead to death. If you have any of these symptoms, get help right away. Call emergency services, go to your nearest emergency department or crisis center, or call a suicide crisis helpline. Summary  Suicide is when you take your own life.  Promise yourself that you will not do anything extreme when you have suicidal feelings.  Let family, friends, teachers, or counselors know how you are feeling.  Get help right away if you feel as though life is getting too tough to handle and you are thinking about suicide.  This information is not intended to replace advice given to you by your health care provider. Make sure you discuss any questions you have with your health care provider. Document Released: 12/10/2002 Document Revised: 09/26/2018 Document Reviewed: 01/16/2017 Elsevier Patient Education  2020 [employer].

## 2019-04-14 ENCOUNTER — Emergency Department: Payer: Medicare Other

## 2019-04-14 ENCOUNTER — Other Ambulatory Visit: Payer: Self-pay

## 2019-04-14 ENCOUNTER — Emergency Department
Admission: EM | Admit: 2019-04-14 | Discharge: 2019-04-14 | Disposition: A | Discharge status: Skilled Nursing Facility | Payer: Medicare Other | Attending: Emergency Medicine | Admitting: Emergency Medicine

## 2019-04-14 ENCOUNTER — Encounter: Payer: Self-pay | Admitting: Emergency Medicine

## 2019-04-14 DIAGNOSIS — C9200 Acute myeloblastic leukemia, not having achieved remission: Secondary | ICD-10-CM | POA: Diagnosis present

## 2019-04-14 DIAGNOSIS — Z79899 Other long term (current) drug therapy: Secondary | ICD-10-CM | POA: Diagnosis not present

## 2019-04-14 DIAGNOSIS — E11628 Type 2 diabetes mellitus with other skin complications: Secondary | ICD-10-CM | POA: Diagnosis present

## 2019-04-14 DIAGNOSIS — E119 Type 2 diabetes mellitus without complications: Secondary | ICD-10-CM | POA: Insufficient documentation

## 2019-04-14 DIAGNOSIS — Z7901 Long term (current) use of anticoagulants: Secondary | ICD-10-CM | POA: Insufficient documentation

## 2019-04-14 DIAGNOSIS — Z8546 Personal history of malignant neoplasm of prostate: Secondary | ICD-10-CM | POA: Insufficient documentation

## 2019-04-14 DIAGNOSIS — R945 Abnormal results of liver function studies: Secondary | ICD-10-CM | POA: Insufficient documentation

## 2019-04-14 DIAGNOSIS — A419 Sepsis, unspecified organism: Secondary | ICD-10-CM | POA: Diagnosis present

## 2019-04-14 DIAGNOSIS — L8990 Pressure ulcer of unspecified site, unspecified stage: Secondary | ICD-10-CM | POA: Diagnosis present

## 2019-04-14 DIAGNOSIS — I10 Essential (primary) hypertension: Secondary | ICD-10-CM | POA: Insufficient documentation

## 2019-04-14 DIAGNOSIS — Z794 Long term (current) use of insulin: Secondary | ICD-10-CM | POA: Insufficient documentation

## 2019-04-14 DIAGNOSIS — Z7982 Long term (current) use of aspirin: Secondary | ICD-10-CM | POA: Diagnosis not present

## 2019-04-14 DIAGNOSIS — R7989 Other specified abnormal findings of blood chemistry: Secondary | ICD-10-CM

## 2019-04-14 DIAGNOSIS — F332 Major depressive disorder, recurrent severe without psychotic features: Secondary | ICD-10-CM | POA: Diagnosis present

## 2019-04-14 DIAGNOSIS — R110 Nausea: Secondary | ICD-10-CM | POA: Diagnosis present

## 2019-04-14 DIAGNOSIS — N179 Acute kidney failure, unspecified: Secondary | ICD-10-CM | POA: Diagnosis present

## 2019-04-14 LAB — URINALYSIS, COMPLETE (UACMP) WITH MICROSCOPIC
Bilirubin Urine: NEGATIVE
Glucose, UA: NEGATIVE mg/dL
Ketones, ur: NEGATIVE mg/dL
Nitrite: NEGATIVE
Protein, ur: 30 mg/dL — AB
RBC / HPF: >50 RBC/hpf — ABNORMAL HIGH (ref 0–5)
Specific Gravity, Urine: 1.017 (ref 1.005–1.030)
WBC, UA: >50 WBC/hpf — ABNORMAL HIGH (ref 0–5)
pH: 5 (ref 5.0–8.0)

## 2019-04-14 LAB — CBC WITH DIFFERENTIAL/PLATELET
Abs Immature Granulocytes: 0.44 10*3/uL — ABNORMAL HIGH (ref 0.00–0.07)
Basophils Absolute: 0 10*3/uL (ref 0.0–0.1)
Basophils Relative: 1 %
Eosinophils Absolute: 0.1 10*3/uL (ref 0.0–0.5)
Eosinophils Relative: 1 %
HCT: 24.5 % — ABNORMAL LOW (ref 39.0–52.0)
Hemoglobin: 7.8 g/dL — ABNORMAL LOW (ref 13.0–17.0)
Immature Granulocytes: 13 %
Lymphocytes Relative: 32 %
Lymphs Abs: 1.1 10*3/uL (ref 0.7–4.0)
MCH: 24.8 pg — ABNORMAL LOW (ref 26.0–34.0)
MCHC: 31.8 g/dL (ref 30.0–36.0)
MCV: 77.8 fL — ABNORMAL LOW (ref 80.0–100.0)
Monocytes Absolute: 0.2 10*3/uL (ref 0.1–1.0)
Monocytes Relative: 5 %
Neutro Abs: 1.7 10*3/uL (ref 1.7–7.7)
Neutrophils Relative %: 48 %
Platelets: 257 10*3/uL (ref 150–400)
RBC: 3.15 MIL/uL — ABNORMAL LOW (ref 4.22–5.81)
RDW: 23.2 % — ABNORMAL HIGH (ref 11.5–15.5)
WBC: 3.5 10*3/uL — ABNORMAL LOW (ref 4.0–10.5)
nRBC: 3.5 % — ABNORMAL HIGH (ref 0.0–0.2)

## 2019-04-14 LAB — COMPREHENSIVE METABOLIC PANEL
ALT: 44 U/L (ref 0–44)
AST: 52 U/L — ABNORMAL HIGH (ref 15–41)
Albumin: 2.9 g/dL — ABNORMAL LOW (ref 3.5–5.0)
Alkaline Phosphatase: 142 U/L — ABNORMAL HIGH (ref 38–126)
Anion gap: 11 (ref 5–15)
BUN: 11 mg/dL (ref 8–23)
CO2: 26 mmol/L (ref 22–32)
Calcium: 8.6 mg/dL — ABNORMAL LOW (ref 8.9–10.3)
Chloride: 100 mmol/L (ref 98–111)
Creatinine, Ser: 0.84 mg/dL (ref 0.61–1.24)
GFR calc Af Amer: >60 mL/min (ref >60)
GFR calc non Af Amer: >60 mL/min (ref >60)
Glucose, Bld: 217 mg/dL — ABNORMAL HIGH (ref 70–99)
Potassium: 3.8 mmol/L (ref 3.5–5.1)
Sodium: 137 mmol/L (ref 135–145)
Total Bilirubin: 1.1 mg/dL (ref 0.3–1.2)
Total Protein: 6.8 g/dL (ref 6.5–8.1)

## 2019-04-14 LAB — CBC
HCT: 24.9 % — ABNORMAL LOW (ref 39.0–52.0)
Hemoglobin: 7.8 g/dL — ABNORMAL LOW (ref 13.0–17.0)
MCH: 24.5 pg — ABNORMAL LOW (ref 26.0–34.0)
MCHC: 31.3 g/dL (ref 30.0–36.0)
MCV: 78.1 fL — ABNORMAL LOW (ref 80.0–100.0)
Platelets: 249 10*3/uL (ref 150–400)
RBC: 3.19 MIL/uL — ABNORMAL LOW (ref 4.22–5.81)
RDW: 23.1 % — ABNORMAL HIGH (ref 11.5–15.5)
WBC: 3.5 10*3/uL — ABNORMAL LOW (ref 4.0–10.5)
nRBC: 4 % — ABNORMAL HIGH (ref 0.0–0.2)

## 2019-04-14 MED ORDER — SODIUM CHLORIDE 0.9 % IV BOLUS
1000.0000 mL | Freq: Once | INTRAVENOUS | Status: AC
  Administered 2019-04-14: 1000 mL via INTRAVENOUS

## 2019-04-14 MED ORDER — ONDANSETRON HCL 4 MG/2ML IJ SOLN
4.0000 mg | Freq: Once | INTRAMUSCULAR | Status: AC
  Administered 2019-04-14: 4 mg via INTRAVENOUS
  Filled 2019-04-14: qty 2

## 2019-04-14 MED ORDER — SODIUM CHLORIDE 0.9 % IV SOLN
1.0000 g | Freq: Once | INTRAVENOUS | Status: AC
  Administered 2019-04-14: 1 g via INTRAVENOUS
  Filled 2019-04-14: qty 10

## 2019-04-14 NOTE — ED Notes (Signed)
Pt continues to request transfer to [facility] and to have foley cath removed. Delay explained. Pt requesting sister be updated. This RN explained Sherlene Shams already updated her. Pt declines another warm blanket. Pillow placed under pt's R hip as requested. Pt given 2nd pillow for head. Pt's buttocks red and pealing (stage1-2) wound. Bed locked low. Rail up. Call bell within reach. Pt watching TV.

## 2019-04-14 NOTE — ED Notes (Signed)
Pt declined remote to tv.

## 2019-04-14 NOTE — ED Notes (Signed)
Pt reports he has a sore on his bottom that hurts real bad. Pt also reports he wants to go to [facility] where his cancer doctor is.

## 2019-04-14 NOTE — ED Provider Notes (Signed)
[facility] Emergency Department Provider Note   ____________________________________________   First MD Initiated Contact with Patient 04/14/19 1526     (approximate)  I have reviewed the triage vital signs and the nursing notes.   HISTORY  Chief Complaint Nausea    HPI ARVIN RAUB is a 73 y.o. male who comes in a nursing home.  Apparently the nursing home sent him here because he was cutting on his wrist with nail clippers to hurt himself.  Patient says he did it because he does not want to be there.  He wants to go somewhere else.  He says he is not homicidal or suicidal.  His sister is here with him.  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  Patient also complains of burning in his penis.  He has a UTI and a urinary catheter in place because he was not emptying his bladder completely.  He has some mild diffuse abdominal pain better than earlier in the month when he had a CT scan of his belly.  He has some nausea as well.  His labs show some elevated liver functions which are similar to what he had previously.        Past Medical History:  Diagnosis Date  . Anxiety   . Diabetes mellitus without complication ([place])   . Epilepsy ([place])   . High cholesterol   . Hypertension   . Prostate cancer [facility]) 2004   prostate removed    Patient Active Problem List   Diagnosis Date Noted  . 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 Pressure injury of skin 04/05/2019  . Sepsis ([place]) 03/27/2019  . AKI (acute kidney injury) ([place]) 07/24/2018  . 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 ([place]) 06/13/2018    Past Surgical History:  Procedure Laterality Date  . CARDIAC SURGERY    . FLEXIBLE SIGMOIDOSCOPY N/A 04/04/2019   Procedure: FLEXIBLE SIGMOIDOSCOPY;  Surgeon: Virgel Manifold, MD;  Location: [facility] ENDOSCOPY;   Service: Endoscopy;  Laterality: N/A;  . IRRIGATION AND DEBRIDEMENT FOOT Right 06/14/2018   Procedure: IRRIGATION AND DEBRIDEMENT FOOT;  Surgeon: Sharlotte Alamo, DPM;  Location: [facility] ORS;  Service: Podiatry;  Laterality: Right;    Prior to Admission medications   Medication Sig Start Date End Date Taking? Authorizing Provider  acetaminophen-codeine (TYLENOL #3) 300-30 MG tablet Take 1 tablet by mouth every 8 (eight) hours as needed for moderate pain. 02/14/19   Gregor Hams, MD  apixaban (ELIQUIS) 5 MG TABS tablet Take 2 tablets (10mg ) by mouth twice a day for 6 more days, then take 1 tablet (5mg ) by mouth twice a day 04/09/19   Mayo, Pete Pelt, MD  aspirin EC 81 MG tablet Take 81 mg by mouth daily.    [provider]  atorvastatin (LIPITOR) 20 MG tablet Take 20 mg by mouth daily.    [provider]  Cholecalciferol (VITAMIN D-1000 MAX ST) 25 MCG (1000 UT) tablet Take 1,000 Units by mouth daily. 08/30/12   [provider]  clonazePAM (KLONOPIN) 0.5 MG tablet Take 1 tablet (0.5 mg total) by mouth 2 (two) times daily. 04/13/19   Patrecia Pour, NP  Cyanocobalamin (VITAMIN B-12 CR) 1000 MCG TBCR Take 2,000 mcg by mouth daily.  12/27/10   [provider]  ferrous sulfate 325 (65 FE) MG tablet Take 1 tablet (325 mg total) by  mouth 2 (two) times daily with a meal. 04/13/19   Lord, Asa Saunas, NP  furosemide (LASIX) 40 MG tablet Take 40 mg by mouth daily as needed for edema. 02/19/19   [provider]  gabapentin (NEURONTIN) 300 MG capsule Take 300 mg by mouth daily.    [provider]  gabapentin (NEURONTIN) 300 MG capsule Take 1,200 mg by mouth at bedtime.    [provider]  glimepiride (AMARYL) 2 MG tablet Take 2-4 mg by mouth 2 (two) times daily. 4 mg in the morning and 2 mg in the evening 07/19/18   [provider]  hydrOXYzine (ATARAX/VISTARIL) 25 MG tablet Take 25-50 mg by mouth 2 (two) times daily. 25 mg in the morning and 50 mg  at bedtime 01/05/15   [provider]  insulin aspart (NOVOLOG) 100 UNIT/ML injection Inject 2-15 Units into the skin 3 (three) times daily with meals. Per sliding scale 121-150= 2 units 151-200= 3 units 201-250= 5 units 251-300= 8 units 301-350= 11 units 351-400= 15 units    [provider]  insulin glargine (LANTUS) 100 UNIT/ML injection Inject 5 Units into the skin daily.    [provider]  lacosamide 100 MG TABS Take 1 tablet (100 mg total) by mouth 2 (two) times daily. 04/09/19   Mayo, Pete Pelt, MD  metoprolol succinate (TOPROL-XL) 50 MG 24 hr tablet Take 50 mg by mouth 2 (two) times daily. 01/01/19   [provider]  naphazoline-glycerin (CLEAR EYES REDNESS) 0.012-0.2 % SOLN Place 2 drops into both eyes daily. 04/09/19   Mayo, Pete Pelt, MD  nitroGLYCERIN (NITROSTAT) 0.4 MG SL tablet Take 0.4 mg by mouth every 5 (five) minutes as needed for chest pain.  07/24/14   [provider]  Nutritional Supplements (FEEDING SUPPLEMENT, NEPRO CARB STEADY,) LIQD Take 237 mLs by mouth 2 (two) times daily between meals. 04/09/19   Mayo, Pete Pelt, MD  Omega-3 1000 MG CAPS Take 1,000 mg by mouth daily. 12/23/10   [provider]  omeprazole (PRILOSEC) 20 MG capsule Take 20 mg by mouth daily.  12/19/14   [provider]  PARoxetine (PAXIL) 20 MG tablet Take 1 tablet (20 mg total) by mouth daily. 06/18/18   Gladstone Lighter, MD  pioglitazone (ACTOS) 30 MG tablet Take 30 mg by mouth daily. 01/23/19   [provider]  polyethylene glycol (MIRALAX / GLYCOLAX) 17 g packet Take 17 g by mouth daily as needed for moderate constipation. 04/09/19   Mayo, Pete Pelt, MD  potassium chloride (KLOR-CON) 10 MEQ tablet Take 10 mEq by mouth daily as needed (when taking furosemide).  02/19/19   [provider]  tamsulosin (FLOMAX) 0.4 MG CAPS capsule Take 1 capsule (0.4 mg total) by mouth daily. 04/10/19   Mayo, Pete Pelt, MD    Allergies Naproxen  sodium, Nsaids, Aleve [naproxen], and Ibuprofen  No family history on file.  Social History Social History   Tobacco Use  . Smoking status: Never Smoker  . Smokeless tobacco: Never Used  Substance Use Topics  . Alcohol use: No  . Drug use: No    Review of Systems  Constitutional: No fever/chills Eyes: No visual changes. ENT: No sore throat. Cardiovascular: Denies chest pain. Respiratory: Denies shortness of breath. Gastrointestinal: Mild abdominal pain.   nausea, no vomiting.  No diarrhea.  No constipation. Genitourinary:  dysuria. Musculoskeletal: Negative for back pain. Skin: Negative for rash. Neurological: Negative for headaches, focal weakness  ____________________________________________   PHYSICAL EXAM:  VITAL  SIGNS: ED Triage Vitals  Enc Vitals Group     BP 04/14/19 1356 (!) 180/90     Pulse Rate 04/14/19 1356 77     Resp 04/14/19 1356 18     Temp 04/14/19 1400 98 F (36.7 C)     Temp Source 04/14/19 1400 Oral     SpO2 04/14/19 1356 100 %     Weight 04/14/19 1358 198 lb (89.8 kg)     Height 04/14/19 1358 5\' 7"  (1.702 m)     Head Circumference --      Peak Flow --      Pain Score 04/14/19 1357 10     Pain Loc --      Pain Edu? --      Excl. in [place]? --     Constitutional: Alert and oriented. Well appearing and in no acute distress. Eyes: Conjunctivae are normal. Head: Atraumatic. Nose: No congestion/rhinnorhea. Mouth/Throat: Mucous membranes are moist.  Oropharynx non-erythematous. Neck: No stridor. Cardiovascular: Normal rate, regular rhythm. Grossly normal heart sounds.  Good peripheral circulation. Respiratory: Normal respiratory effort.  No retractions. Lungs CTAB. Gastrointestinal: Soft mildly diffusely tender. No distention. No abdominal bruits. No CVA tenderness. Genitourinary: Uncircumcised male with Foley catheter Musculoskeletal: No lower extremity tenderness nor edema.  No joint effusions. Neurologic:  Normal speech and language. No gross  focal neurologic deficits are appreciated. No gait instability. Skin:  Skin is warm, dry and intact. No rash noted. Psychiatric: Mood and affect are normal. Speech and behavior are normal.  ____________________________________________   LABS (all labs ordered are listed, but only abnormal results are displayed)  Labs Reviewed  COMPREHENSIVE METABOLIC PANEL - Abnormal; Notable for the following components:      Result Value   Glucose, Bld 217 (*)    Calcium 8.6 (*)    Albumin 2.9 (*)    AST 52 (*)    Alkaline Phosphatase 142 (*)    All other components within normal limits  CBC - Abnormal; Notable for the following components:   WBC 3.5 (*)    RBC 3.19 (*)    Hemoglobin 7.8 (*)    HCT 24.9 (*)    MCV 78.1 (*)    MCH 24.5 (*)    RDW 23.1 (*)    nRBC 4.0 (*)    All other components within normal limits  URINALYSIS, COMPLETE (UACMP) WITH MICROSCOPIC - Abnormal; Notable for the following components:   Color, Urine AMBER (*)    APPearance CLOUDY (*)    Hgb urine dipstick LARGE (*)    Protein, ur 30 (*)    Leukocytes,Ua MODERATE (*)    RBC / HPF >50 (*)    WBC, UA >50 (*)    Bacteria, UA RARE (*)    All other components within normal limits  CBC WITH DIFFERENTIAL/PLATELET - Abnormal; Notable for the following components:   WBC 3.5 (*)    RBC 3.15 (*)    Hemoglobin 7.8 (*)    HCT 24.5 (*)    MCV 77.8 (*)    MCH 24.8 (*)    RDW 23.2 (*)    nRBC 3.5 (*)    Abs Immature Granulocytes 0.44 (*)    All other components within normal limits   ____________________________________________  EKG  Patient developed chest pain shortly after I tell him he is going to have to go home and follow-up outpatient with [facility].  EKG was done and shows normal sinus rhythm rate of 68 there are some premature looks like  atrial contractions normal axis no acute ST-T changes ____________________________________________  RADIOLOGY  ED MD interpretation: Ultrasound read by radiology did not show  any acute findings.  Official radiology report(s): US Abdomen Limited Ruq  Result Date: 04/14/2019 CLINICAL DATA:  Elevated LFTs EXAM: ULTRASOUND ABDOMEN LIMITED RIGHT UPPER QUADRANT COMPARISON:  Abdominal radiograph 04/02/2019, CT abdomen pelvis 03/28/2019 FINDINGS: Gallbladder: Gallbladder appears under distended. No visible gallstones. No frank wall thickening. No pericholecystic fluid or inflammation. Sonographic Percell Miller sign is reportedly negative. 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 No concerning liver abnormality in the visualized portions. Decreased posterior through transmission may reflect some fatty infiltration. Portal vein is patent on color Doppler imaging with normal direction of blood flow towards the liver. Other: None. 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 Decreased hepatic through transmission, may reflect fatty infiltration. Underdistention of the gallbladder, correlate with postprandial state. No gross cholecystic abnormality. Electronically Signed   By: Lovena Le M.D.   On: 04/14/2019 17:28    ____________________________________________   PROCEDURES  Procedure(s) performed (including Critical Care):  Procedures   ____________________________________________   INITIAL IMPRESSION / ASSESSMENT AND PLAN / ED COURSE  At 535 were waiting for [facility] to call back. 553 spoke with [facility] oncology fellow reviewed labs and other studies with him.  He feels the patient is a good candidate for outpatient therapy but not needing inpatient admission at least not for his AML.  He will get them in touch with the scheduler and arrange outpatient follow-up hopefully within the week.      NP at the nursing home calls back and she is worried about him and his  cutting his wrist with the nail file.  We will get a psych consult.       ____________________________________________   FINAL CLINICAL IMPRESSION(S) / ED DIAGNOSES  Final diagnoses:  Elevated LFTs   Also AML remission status not specified  ED Discharge Orders    None       Note:  This document was prepared using Dragon voice recognition software and may include unintentional dictation errors.    Nena Polio, MD 04/14/19 2127

## 2019-04-14 NOTE — ED Notes (Signed)
Pt given two warm blankets.  

## 2019-04-14 NOTE — ED Notes (Signed)
Pt called out using call bell. Pt curious if he is being transferred to [facility].  Pt denies any current needs other than an update. Delay explained again.

## 2019-04-14 NOTE — ED Notes (Signed)
Brandy NP/PA called from [facility] in concern for pt as she states pt has tried to harm self at facility. This RN noted old wound to pt's R wrist earlier today. Theadora Rama states pt told her this morning before being sent to ER that he had thoughts of harming himself but denied a plan. Loletha Grayer requesting to talk with EDP. EDP notified and given number Brandy left.

## 2019-04-14 NOTE — ED Notes (Signed)
Pt states he does not walk. Family agrees pt with need EMS transport upon d/c. Secretary placing request for [facility] transport back to [facility].

## 2019-04-14 NOTE — ED Notes (Addendum)
EMS at bedside - given brief report on pt. Spoke with Therapist, sports at [facility] - updated.

## 2019-04-14 NOTE — ED Notes (Addendum)
Psych provider talking with EDP Mike [name] and on way to pt's room. Pt continues to be on cardiac monitoring.

## 2019-04-14 NOTE — Consult Note (Addendum)
[facility] Consult   Reason for Consult: Nausea Referring Physician: Dr. 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 male patient presented to [facility] ED via EMS from [facility]. After cutting his wrist with a fingernail file at his skilled nursing facility, the patient was brought to the emergency department. The site is tiny, scabbed over reddish; he denies pain or any discomfort.   He was recently discharged from the hospital and moved to an SNF as he could no longer care for himself. Per his sister, Ms. Raoul Pitch [phone]), about nine days ago, he was diagnosed with AML and needed to get stronger before doing chemotherapy.  If he does chemotherapy, he may live for a year.  Without chemotherapy, his life expectancy is three months.  According to his sisters and his inability to care for himself, the patient desires to return home; this is not possible. He denies suicidal, homicidal thoughts,  hallucinations, or substance abuse.  His sister reports he has never had self-harm thoughts before, and he is just adjusting to his diagnosis.  The patient was seen face-to-face by this provider; the chart reviewed and consulted with Dr. Weber Cooks and Dr. Rip Harbour on 04/14/2019 due to the patient's care. It was discussed with both providers that the patient does not meet the criteria to be admitted to the psychiatric inpatient unit.  The patient is alert and oriented x4, calm, cooperative, and mood-congruent with affect on  evaluation. The patient does not appear to be responding to internal or external stimuli. Neither is the patient presenting with any delusional thinking. The patient denies auditory or visual hallucinations. The patient denies suicidal, homicidal, or self-harm ideations. The patient is not presenting with any psychotic or paranoid behaviors. During an encounter with the patient, he was able to answer questions appropriately. Collateral was obtained by the patient's sister Ms. Minerba Zettle [phone]).  She discussed that her brother has never attempted to hurt himself or ever said he was suicidal.  She stated that her brother recently received the news of having AML and voiced that it is expected that he would act this way.  She declared, she wished the nursing home would have called her before having him brought to the ED.  She states that if they remove all objects away from him, this would not have happened.  She voiced he is too weak even to attempt to hurt himself. Plan: The patient is not a safety risk to self or others and does not require psychiatric inpatient admission for stabilization and treatment.  HPI: Per Dr. Rip Harbour; Mike [name] is a 73 y.o. male who comes in a nursing home.  Apparently the nursing home sent him here because he was cutting on his wrist with nail clippers to hurt himself.  Patient says he did it because he does not want to be there.  He wants to go somewhere else.  He says he is not homicidal or suicidal.  His sister is here with him.  She wants him transferred to [facility] to  be further evaluated for treatment for AML which he has.  Patient also complains of burning in his penis.  He has a UTI and a urinary catheter in place because he was not emptying his bladder completely.  He has some mild diffuse abdominal pain better than earlier in the month when he had a CT scan of his belly.  He has some nausea as well.  His labs show some elevated liver functions which are similar to  what he had previously.  Past Psychiatric History:  Anxiety Depression  Risk to Self:  No Risk to Others:  No Prior Inpatient Therapy:  No Prior Outpatient Therapy:  Yes  Past Medical History:  Past Medical History:  Diagnosis Date  . Anxiety   . Diabetes mellitus without complication ([place])   . Epilepsy ([place])   . High cholesterol   . Hypertension   . Prostate cancer [facility]) 2004   prostate removed    Past Surgical History:  Procedure Laterality Date  . CARDIAC SURGERY    . FLEXIBLE SIGMOIDOSCOPY N/A 04/04/2019   Procedure: FLEXIBLE SIGMOIDOSCOPY;  Surgeon: Virgel Manifold, MD;  Location: [facility] ENDOSCOPY;  Service: Endoscopy;  Laterality: N/A;  . IRRIGATION AND DEBRIDEMENT FOOT Right 06/14/2018   Procedure: IRRIGATION AND DEBRIDEMENT FOOT;  Surgeon: Sharlotte Alamo, DPM;  Location: [facility] ORS;  Service: Podiatry;  Laterality: Right;   Family History: No family history on file. Family Psychiatric  History: Maternal-depression Social History:  Social History   Substance and Sexual Activity  Alcohol Use No     Social History   Substance and Sexual Activity  Drug Use No    Social History   Socioeconomic History  . Marital status: Single    Spouse name: Not on file  . Number of children: Not on file  . Years of education: Not on file  . Highest education level: Not on file  Occupational History  . Not on file  Social Needs  . Financial resource strain: Not on file  . Food insecurity    Worry: Not on file    Inability: Not on file  . Transportation needs    Medical: Not on file    Non-medical: Not on file  Tobacco Use  . Smoking status: Never Smoker  . Smokeless tobacco: Never Used  Substance and Sexual Activity  . Alcohol use: No  . Drug use: No  . Sexual activity: Not Currently  Lifestyle  . Physical activity    Days per week: Not on file    Minutes per session: Not on file  . Stress: Not on file  Relationships  . Social Herbalist on phone:  Not on file    Gets together: Not on file    Attends religious service: Not on file    Active member of club or organization: Not on file    Attends meetings of clubs or organizations: Not on file    Relationship status: Not on file  Other Topics Concern  . Not on file  Social History Narrative  . Not on file   Additional Social History:    Allergies:   Allergies  Allergen Reactions  . Naproxen Sodium Other (See Comments)    Reaction: Upset stomach  . Nsaids Other (See Comments)    Other reaction(s): Unknown  . Aleve [Naproxen] Nausea And Vomiting  . Ibuprofen Nausea Only    Reaction: Upset stomach     Labs:  Results for orders placed or performed during the  hospital encounter of 04/14/19 (from the past 48 hour(s))  Comprehensive metabolic panel     Status: Abnormal   Collection Time: 04/14/19  1:54 PM  Result Value Ref Range   Sodium 137 135 - 145 mmol/L   Potassium 3.8 3.5 - 5.1 mmol/L   Chloride 100 98 - 111 mmol/L   CO2 26 22 - 32 mmol/L   Glucose, Bld 217 (H) 70 - 99 mg/dL   BUN 11 8 - 23 mg/dL   Creatinine, Ser 0.84 0.61 - 1.24 mg/dL   Calcium 8.6 (L) 8.9 - 10.3 mg/dL   Total Protein 6.8 6.5 - 8.1 g/dL   Albumin 2.9 (L) 3.5 - 5.0 g/dL   AST 52 (H) 15 - 41 U/L   ALT 44 0 - 44 U/L   Alkaline Phosphatase 142 (H) 38 - 126 U/L   Total Bilirubin 1.1 0.3 - 1.2 mg/dL   GFR calc non Af Amer >60 >60 mL/min   GFR calc Af Amer >60 >60 mL/min   Anion gap 11 5 - 15    Comment: Performed at [facility], [address] [place], [place] [address]  CBC     Status: Abnormal   Collection Time: 04/14/19  1:54 PM  Result Value Ref Range   WBC 3.5 (L) 4.0 - 10.5 K/uL   RBC 3.19 (L) 4.22 - 5.81 MIL/uL   Hemoglobin 7.8 (L) 13.0 - 17.0 g/dL   HCT 24.9 (L) 39.0 - 52.0 %   MCV 78.1 (L) 80.0 - 100.0 fL   MCH 24.5 (L) 26.0 - 34.0 pg   MCHC 31.3 30.0 - 36.0 g/dL   RDW 23.1 (H) 11.5 - 15.5 %   Platelets 249 150 - 400 K/uL   nRBC 4.0 (H) 0.0 - 0.2 %    Comment:  Performed at [facility], [address] [place], [place] [address]  CBC with Differential/Platelet     Status: Abnormal   Collection Time: 04/14/19  1:54 PM  Result Value Ref Range   WBC 3.5 (L) 4.0 - 10.5 K/uL   RBC 3.15 (L) 4.22 - 5.81 MIL/uL   Hemoglobin 7.8 (L) 13.0 - 17.0 g/dL    Comment: Reticulocyte Hemoglobin testing may be clinically indicated, consider ordering this additional test [id number]    HCT 24.5 (L) 39.0 - 52.0 %   MCV 77.8 (L) 80.0 - 100.0 fL   MCH 24.8 (L) 26.0 - 34.0 pg   MCHC 31.8 30.0 - 36.0 g/dL   RDW 23.2 (H) 11.5 - 15.5 %   Platelets 257 150 - 400 K/uL   nRBC 3.5 (H) 0.0 - 0.2 %   Neutrophils Relative % 48 %   Neutro Abs 1.7 1.7 - 7.7 K/uL   Lymphocytes Relative 32 %   Lymphs Abs 1.1 0.7 - 4.0 K/uL   Monocytes Relative 5 %   Monocytes Absolute 0.2 0.1 - 1.0 K/uL   Eosinophils Relative 1 %   Eosinophils Absolute 0.1 0.0 - 0.5 K/uL   Basophils Relative 1 %   Basophils Absolute 0.0 0.0 - 0.1 K/uL   WBC Morphology MILD LEFT SHIFT (1-5% METAS, OCC MYELO, OCC BANDS)     Comment: TOXIC GRANULATION   Smear Review MORPHOLOGY UNREMARKABLE    Immature Granulocytes 13 %   Abs Immature Granulocytes 0.44 (H) 0.00 - 0.07 K/uL   Polychromasia PRESENT     Comment: Performed at [facility], [address], [place], [place] [address]  Urinalysis, Complete w Microscopic  Status: Abnormal   Collection Time: 04/14/19  1:59 PM  Result Value Ref Range   Color, Urine AMBER (A) YELLOW    Comment: BIOCHEMICALS MAY BE AFFECTED BY COLOR   APPearance CLOUDY (A) CLEAR   Specific Gravity, Urine 1.017 1.005 - 1.030   pH 5.0 5.0 - 8.0   Glucose, UA NEGATIVE NEGATIVE mg/dL   Hgb urine dipstick LARGE (A) NEGATIVE   Bilirubin Urine NEGATIVE NEGATIVE   Ketones, ur NEGATIVE NEGATIVE mg/dL   Protein, ur 30 (A) NEGATIVE mg/dL   Nitrite NEGATIVE NEGATIVE   Leukocytes,Ua MODERATE (A) NEGATIVE   RBC / HPF >50 (H) 0 - 5 RBC/hpf   WBC, UA >50 (H) 0 - 5  WBC/hpf   Bacteria, UA RARE (A) NONE SEEN   Squamous Epithelial / LPF 0-5 0 - 5   Mucus PRESENT    Budding Yeast PRESENT    Hyaline Casts, UA PRESENT    Amorphous Crystal PRESENT     Comment: Performed at [facility], [address] [place], [place] [address]    No current facility-administered medications for this encounter.    Current Outpatient Medications  Medication Sig Dispense Refill  . acetaminophen-codeine (TYLENOL #3) 300-30 MG tablet Take 1 tablet by mouth every 8 (eight) hours as needed for moderate pain. 20 tablet 0  . apixaban (ELIQUIS) 5 MG TABS tablet Take 2 tablets (10mg ) by mouth twice a day for 6 more days, then take 1 tablet (5mg ) by mouth twice a day 60 tablet 0  . aspirin EC 81 MG tablet Take 81 mg by mouth daily.    Marland Kitchen atorvastatin (LIPITOR) 20 MG tablet Take 20 mg by mouth daily.    . Cholecalciferol (VITAMIN D-1000 MAX ST) 25 MCG (1000 UT) tablet Take 1,000 Units by mouth daily.    . clonazePAM (KLONOPIN) 0.5 MG tablet Take 1 tablet (0.5 mg total) by mouth 2 (two) times daily. 60 tablet 0  . Cyanocobalamin (VITAMIN B-12 CR) 1000 MCG TBCR Take 2,000 mcg by mouth daily.     . ferrous sulfate 325 (65 FE) MG tablet Take 1 tablet (325 mg total) by mouth 2 (two) times daily with a meal. 60 tablet 3  . furosemide (LASIX) 40 MG tablet Take 40 mg by mouth daily as needed for edema.    . gabapentin (NEURONTIN) 300 MG capsule Take 300 mg by mouth daily.    Marland Kitchen gabapentin (NEURONTIN) 300 MG capsule Take 1,200 mg by mouth at bedtime.    Marland Kitchen glimepiride (AMARYL) 2 MG tablet Take 2-4 mg by mouth 2 (two) times daily. 4 mg in the morning and 2 mg in the evening    . hydrOXYzine (ATARAX/VISTARIL) 25 MG tablet Take 25-50 mg by mouth 2 (two) times daily. 25 mg in the morning and 50 mg at bedtime    . insulin aspart (NOVOLOG) 100 UNIT/ML injection Inject 2-15 Units into the skin 3 (three) times daily with meals. Per sliding scale 121-150= 2 units 151-200= 3 units 201-250= 5  units 251-300= 8 units 301-350= 11 units 351-400= 15 units    . insulin glargine (LANTUS) 100 UNIT/ML injection Inject 5 Units into the skin daily.    Marland Kitchen lacosamide 100 MG TABS Take 1 tablet (100 mg total) by mouth 2 (two) times daily. 60 tablet 0  . metoprolol succinate (TOPROL-XL) 50 MG 24 hr tablet Take 50 mg by mouth 2 (two) times daily.    . naphazoline-glycerin (CLEAR EYES REDNESS) 0.012-0.2 % SOLN Place 2 drops  into both eyes daily. 30 mL 0  . nitroGLYCERIN (NITROSTAT) 0.4 MG SL tablet Take 0.4 mg by mouth every 5 (five) minutes as needed for chest pain.     . Nutritional Supplements (FEEDING SUPPLEMENT, NEPRO CARB STEADY,) LIQD Take 237 mLs by mouth 2 (two) times daily between meals. 1000 mL 0  . Omega-3 1000 MG CAPS Take 1,000 mg by mouth daily.    Marland Kitchen omeprazole (PRILOSEC) 20 MG capsule Take 20 mg by mouth daily.     Marland Kitchen PARoxetine (PAXIL) 20 MG tablet Take 1 tablet (20 mg total) by mouth daily. 30 tablet 1  . pioglitazone (ACTOS) 30 MG tablet Take 30 mg by mouth daily.    . polyethylene glycol (MIRALAX / GLYCOLAX) 17 g packet Take 17 g by mouth daily as needed for moderate constipation. 14 each 0  . potassium chloride (KLOR-CON) 10 MEQ tablet Take 10 mEq by mouth daily as needed (when taking furosemide).     . tamsulosin (FLOMAX) 0.4 MG CAPS capsule Take 1 capsule (0.4 mg total) by mouth daily. 30 capsule 0    Musculoskeletal: Strength & Muscle Tone: within normal limits Gait & Station: normal Patient leans: N/A  Psychiatric Specialty Exam: Physical Exam  Nursing note and vitals reviewed. Constitutional: He is oriented to person, place, and time.  Neck: Normal range of motion. Neck supple.  Neurological: He is alert and oriented to person, place, and time.  Psychiatric: His behavior is normal.    Review of Systems  Psychiatric/Behavioral: Positive for depression. The patient is nervous/anxious.   All other systems reviewed and are negative.   Blood pressure (!) 167/64, pulse  70, temperature 97.8 F (36.6 C), temperature source Oral, resp. rate 18, height 5\' 7"  (1.702 m), weight 89.8 kg, SpO2 97 %.Body mass index is 31.01 kg/m.  General Appearance: Casual  Eye Contact:  Good  Speech:  Clear and Coherent  Volume:  Normal  Mood:  Depressed  Affect:  Appropriate, Congruent, Depressed and Flat  Thought Process:  Coherent  Orientation:  Full (Time, Place, and Person)  Thought Content:  WDL and Logical  Suicidal Thoughts:  No  Homicidal Thoughts:  No  Memory:  Immediate;   Good Recent;   Good Remote;   Good  Judgement:  Fair  Insight:  Good  Psychomotor Activity:  Normal  Concentration:  Concentration: Good and Attention Span: Good  Recall:  Good  Fund of Knowledge:  Good  Language:  Good  Akathisia:  Negative  Handed:  Right  AIMS (if indicated):     Assets:  Desire for Improvement Physical Health Resilience Social Support  ADL's:  Intact  Cognition:  WNL  Sleep:     Good     Treatment Plan Summary: Plan Patient does not meet criteria for psychiatric inpatient admission.  Disposition: No evidence of imminent risk to self or others at present.   Patient does not meet criteria for psychiatric inpatient admission. Supportive therapy provided about ongoing stressors. Discussed crisis plan, support from social network, calling 911, coming to the Emergency Department, and calling Suicide Hotline.  Caroline Sauger, NP 04/14/2019 11:25 PM

## 2019-04-14 NOTE — ED Notes (Addendum)
Pt's sister at bedside visiting. Pt denies thoughts or intentions to harm self currently. Verbally agrees to notify this RN if he changes his mind.

## 2019-04-14 NOTE — ED Notes (Addendum)
Secretary called this RN to noify pt calling out with call bell d/t CP. Secretary states told EDP Malinda in person that pt c/o CP. EDP Malinda had just left pt's room after telling pt he would be d/c back to [facility] post psych consult if approved by psych. No new orders from [facility]

## 2019-04-14 NOTE — ED Notes (Signed)
EKG to EDP Malinda. Pt continues to c/o CP but states it is much better than earlier. Pt calmly laying in bed; dry; relaxed; no grimacing/moaning/groaning noted. EKG displays NSR.

## 2019-04-14 NOTE — Discharge Instructions (Addendum)
Please follow-up with [facility] hematology oncology outpatient.  I spoke to them and they said they would get you an outpatient appointment within about the next week or so.  Please return here for any further problems

## 2019-04-14 NOTE — ED Notes (Signed)
400cc of urine emptied from urine bag. Yellow/straw. Clear. No major odor.

## 2019-04-14 NOTE — ED Notes (Signed)
NS bolus and rocephin manually programmed into pump.

## 2019-04-14 NOTE — ED Triage Notes (Signed)
Pt in via EMS from [facility]. EMS reports per facility, pt was seen in the ED on Saturday for cutting his wrist with nail clippers. EMS reports per facility, today the PA asked him if he would do it again and he said he did not know so they called EMS for an ED evaluation. Pt reports he said that because he does not like [facility]. Pt also reports he does not feel well in his stomach and his urinary catheter hurts.

## 2019-04-14 NOTE — ED Notes (Addendum)
Pt given food tray and drink. States not very hungry. Repositioned as requested. Family remains at bedside with pt. 400cc of urine emptied from urine bag. Pt c/o being uncomfortable "between legs" per pt. This RN assessed foley cath. Moved leg strap up pt's leg. Site C/D/I. Briefs dry; no BM present.

## 2019-04-14 NOTE — ED Notes (Signed)
Family/pt educated about d/c paperwork. Family leaving bedside.

## 2019-04-15 ENCOUNTER — Other Ambulatory Visit: Payer: Self-pay

## 2019-04-16 ENCOUNTER — Other Ambulatory Visit: Payer: Self-pay

## 2019-04-16 ENCOUNTER — Encounter: Payer: Self-pay | Admitting: Urology

## 2019-04-16 ENCOUNTER — Ambulatory Visit (INDEPENDENT_AMBULATORY_CARE_PROVIDER_SITE_OTHER): Payer: Medicare Other | Admitting: Urology

## 2019-04-16 VITALS — BP 139/64 | HR 73

## 2019-04-16 DIAGNOSIS — R339 Retention of urine, unspecified: Secondary | ICD-10-CM | POA: Diagnosis not present

## 2019-04-16 DIAGNOSIS — C61 Malignant neoplasm of prostate: Secondary | ICD-10-CM

## 2019-04-16 NOTE — Progress Notes (Signed)
04/16/19 8:51 AM   Mike [name] July 19, 1945 [id number]  Referring provider: Adin Hector, MD [facility] [place],  [place] [address]  CC: Urinary retention  HPI: I saw Mike [name] in urology clinic for evaluation of urinary retention.  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  On CT, the bladder was quite distended, and prostate was surgically absent consistent with his history.  He had a significant rectal stool burden at that time and I suspect this was secondary to constipation.  Multiple urine cultures showed no growth at that time.  He denies any urinary symptoms at baseline specifically any gross hematuria, urinary incontinence, weak stream, or difficulty urinating.  There are no aggravating or alleviating factors.  Severity is moderate.  He continues to have a Foley catheter and is draining clear yellow urine.   PMH: Past Medical History:  Diagnosis Date  . Anxiety   . Diabetes mellitus without complication ([place])   . Epilepsy ([place])   . High cholesterol   . Hypertension   . Prostate cancer [facility] 2004   prostate removed    Surgical History: Past Surgical History:  Procedure Laterality Date  . CARDIAC SURGERY    . FLEXIBLE SIGMOIDOSCOPY N/A 04/04/2019   Procedure: FLEXIBLE SIGMOIDOSCOPY;  Surgeon: Virgel Manifold, MD;  Location: [facility] ENDOSCOPY;  Service: Endoscopy;  Laterality: N/A;  . IRRIGATION AND DEBRIDEMENT FOOT Right 06/14/2018   Procedure: IRRIGATION AND DEBRIDEMENT FOOT;  Surgeon: Sharlotte Alamo, DPM;  Location: [facility] ORS;  Service: Podiatry;  Laterality: Right;    Allergies:  Allergies  Allergen Reactions  .  Naproxen Sodium Other (See Comments)    Reaction: Upset stomach  . Nsaids Other (See Comments)    Other reaction(s): Unknown  . Aleve [Naproxen] Nausea And Vomiting  . Ibuprofen Nausea Only    Reaction: Upset stomach     Family History: No family history on file.  Social History:  reports that he has never smoked. He has never used smokeless tobacco. He reports that he does not drink alcohol or use drugs.  ROS: Please see flowsheet from today's date for complete review of systems.  Physical Exam: BP 139/64   Pulse 73    Constitutional: In wheelchair, frail-appearing Cardiovascular: No clubbing, cyanosis, or edema. Respiratory: Normal respiratory effort, no increased work of breathing. GI: Abdomen is soft, nontender, nondistended, no abdominal masses GU: 68 French Foley with clear yellow urine Lymph: No cervical or inguinal lymphadenopathy. Neurologic: Grossly intact, no focal deficits, moving all 4 extremities. Psychiatric: Normal mood and affect.  Laboratory Data: Reviewed Normal renal function  Pertinent Imaging: I have personally reviewed the CT from 10/9, massively distended bladder mild upstream fullness of the collecting systems, prostate surgically absent  Assessment & Plan:   In summary, the patient is an extremely comorbid and frail-appearing 73 year old male who resides in assisted living facility and developed urinary retention on 03/28/2019 likely secondary to severe constipation and fecal impaction.  His prostate is surgically absent after being treated with prostatectomy for prostate cancer in 2013, and PSA remains very low after surgery consistent with no recurrence of his disease.  I long conversation with the patient about these findings, and I recommended an  aggressive bowel regimen prior to removing his catheter and giving him a trial of void.  If he is still unable to void at that time, would consider cystoscopy to evaluate bladder neck contracture, however  a 16 [place] Foley reportedly passed easily.  Aggressive bowel regimen with MiraLAX and Senokot RTC 2 weeks for voiding trial  A total of 45 minutes were spent face-to-face with the patient, greater than 50% was spent in patient education, counseling, and coordination of care regarding urinary retention in the setting of prior prostatectomy.   Billey Co, [facility] [address] [place], [place] [address] [phone]

## 2019-04-17 ENCOUNTER — Ambulatory Visit (INDEPENDENT_AMBULATORY_CARE_PROVIDER_SITE_OTHER): Payer: Self-pay | Admitting: Gastroenterology

## 2019-04-17 ENCOUNTER — Encounter: Payer: Self-pay | Admitting: Gastroenterology

## 2019-04-17 DIAGNOSIS — Z5329 Procedure and treatment not carried out because of patient's decision for other reasons: Secondary | ICD-10-CM

## 2019-04-21 LAB — MISC LABCORP TEST (SEND OUT): Labcorp test code: 451953

## 2019-04-22 ENCOUNTER — Ambulatory Visit: Payer: POS | Admitting: Gastroenterology

## 2019-04-22 NOTE — Progress Notes (Signed)
04/23/19 3:37 PM   Mike [name] 12-04-1945 [id number]  Referring provider: Adin Hector, MD [place],  [place] [address]  CC: Urinary retention  HPI: Mike [name] is a 73 year old male with urinary retention who presents today for a TOV.   He saw Dr. Diamantina Providence on 04/16/2019 and was placed on a bowel regimen of MiraLax and Senokot.    Background history I saw Mike [name] in urology clinic for evaluation of urinary retention.  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  On CT, the bladder was quite distended, and prostate was surgically absent consistent with his history.  He had a significant rectal stool burden at that time and I suspect this was secondary to constipation.  Multiple urine cultures showed no growth at that time.  He denies any urinary symptoms at baseline specifically any gross hematuria, urinary incontinence, weak stream, or difficulty urinating.  There are no aggravating or alleviating factors.  Severity is moderate.  He continues to have a Foley catheter and is draining clear yellow urine.  He has no complaints at this time.  According to his MAR from [facility], he is receiving MiraLax and Senokot.  He states he is having BM"s.     PMH: Past Medical History:  Diagnosis Date  . Anxiety   . Diabetes mellitus without complication ([place])   . Epilepsy ([place])   . High cholesterol   . Hypertension   . Prostate cancer [facility]) 2004   prostate removed    Surgical History: Past Surgical History:  Procedure Laterality Date  . CARDIAC SURGERY    . FLEXIBLE SIGMOIDOSCOPY N/A 04/04/2019   Procedure: FLEXIBLE  SIGMOIDOSCOPY;  Surgeon: Virgel Manifold, MD;  Location: [facility] ENDOSCOPY;  Service: Endoscopy;  Laterality: N/A;  . IRRIGATION AND DEBRIDEMENT FOOT Right 06/14/2018   Procedure: IRRIGATION AND DEBRIDEMENT FOOT;  Surgeon: Sharlotte Alamo, DPM;  Location: [facility] ORS;  Service: Podiatry;  Laterality: Right;    Allergies:  Allergies  Allergen Reactions  . Naproxen Sodium Other (See Comments)    Reaction: Upset stomach  . Nsaids Other (See Comments)    Other reaction(s): Unknown  . Aleve [Naproxen] Nausea And Vomiting  . Ibuprofen Nausea Only    Reaction: Upset stomach     Family History: No family history on file.  Social History:  reports that he has never smoked. He has never used smokeless tobacco. He reports that he does not drink alcohol or use drugs.  ROS: Please see flowsheet from today's date for complete review of systems.  Physical Exam: BP 102/66   Pulse 79   Ht 5\' 7"  (1.702 m)   BMI 31.01 kg/m   Constitutional:  Well nourished. Alert and oriented, No acute distress. HEENT: [place] AT, moist mucus membranes.  Trachea midline, no masses. Cardiovascular: No clubbing, cyanosis, or edema. Respiratory: Normal respiratory effort, no increased work of breathing. GI: Abdomen is soft, non tender, non distended, no abdominal masses. Liver and spleen not palpable.  No hernias appreciated.  Stool sample for occult testing is not indicated.   GU: No CVA tenderness.  No bladder fullness or masses.  Patient with buried phallus.  Foley in place draining  clear yellow urine.   Skin: No rashes, bruises or suspicious lesions. Neurologic: Grossly intact, no focal deficits, moving all 4 extremities. Psychiatric: Normal mood and affect.   Laboratory Data: Reviewed Normal renal function  Pertinent Imaging: CT from 10/9, massively distended bladder mild upstream fullness of the collecting systems, prostate surgically absent  Catheter Removal Fill and Pull Catheter Removal Patient is  present today for a catheter removal.  Patient was cleaned and prepped in a sterile fashion 200 ml of sterile water/ saline was instilled into the bladder when the patient felt the urge to urinate.  10 ml of water was then drained from the balloon.  A 16 FR foley cath was removed from the bladder no complications were noted .  Patient as then given some time to void on their own.  Patient can void  150 ml on their own after some time.  Patient tolerated well.  Performed by: Zara Council, PA-C  Assessment & Plan:    1. Urinary retention Foley removed today and fill and pull was successful He will return to clinic this afternoon for a bladder scan  Bladder scan this afternoon was 0 mL  RTC in one month for I PSS and PVR   2. History of prostate cancer  PSA in 03/2019 was 0.92  Zara Council, [facility] [address] [place], [place] [address] [phone]

## 2019-04-23 ENCOUNTER — Ambulatory Visit: Payer: Medicare Other | Admitting: Urology

## 2019-04-23 ENCOUNTER — Ambulatory Visit (INDEPENDENT_AMBULATORY_CARE_PROVIDER_SITE_OTHER): Payer: Medicare Other | Admitting: Urology

## 2019-04-23 ENCOUNTER — Other Ambulatory Visit: Payer: Self-pay

## 2019-04-23 ENCOUNTER — Telehealth: Payer: Self-pay

## 2019-04-23 ENCOUNTER — Encounter: Payer: Self-pay | Admitting: Urology

## 2019-04-23 VITALS — BP 102/66 | HR 79 | Ht 67.0 in

## 2019-04-23 DIAGNOSIS — C61 Malignant neoplasm of prostate: Secondary | ICD-10-CM | POA: Diagnosis not present

## 2019-04-23 DIAGNOSIS — R339 Retention of urine, unspecified: Secondary | ICD-10-CM

## 2019-04-23 LAB — BLADDER SCAN AMB NON-IMAGING

## 2019-04-23 NOTE — Telephone Encounter (Signed)
Spoke with patients nurse at [facility] and they say he voided 100cc.  They say transportation has not come to get him to bring him back to the office for post void scan.  Advised them to have patient to the office by 4pm today.

## 2019-05-27 NOTE — Progress Notes (Deleted)
05/27/19 12:04 PM   Mike [name] 10-12-1945 [id number]  Referring provider: Adin Hector, MD [place],  [place] [address]  CC: Urinary retention  HPI: Mike [name] is a 73 year old male with urinary retention who presents today for a TOV.   He saw Dr. Diamantina [name] on 04/16/2019 and was placed on a bowel regimen of MiraLax and Senokot.    Background history I saw Mike [name] in urology clinic for evaluation of urinary retention.  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  On CT, the bladder was quite distended, and prostate was surgically absent consistent with his history.  He had a significant rectal stool burden at that time and I suspect this was secondary to constipation.  Multiple urine cultures showed no growth at that time.  He denies any urinary symptoms at baseline specifically any gross hematuria, urinary incontinence, weak stream, or difficulty urinating.  There are no aggravating or alleviating factors.  Severity is moderate.  He continues to have a Foley catheter and is draining clear yellow urine.  He has no complaints at this time.  According to his MAR from [facility], he is receiving MiraLax and Senokot.  He states he is having BM"s.    Foley was removed on 04/23/2019 for TOV.  Bladder scan at [facility] that afternoon was 100 cc.    BPH WITH LUTS  (prostate and/or bladder) IPSS score: *** PVR: ***   Previous score: ***   Previous PVR: ***   Major complaint(s):  x *** years. Denies any dysuria, hematuria or suprapubic pain.   Currently taking: ***.  His has had ***.   Denies any recent fevers, chills,  nausea or vomiting.  He has a family history of PCa, with ***.   He does not have a family history of PCa.***    Score:  1-7 Mild 8-19 Moderate 20-35 Severe    PMH: Past Medical History:  Diagnosis Date   Anxiety    Diabetes mellitus without complication ([place])    Epilepsy ([place])    High cholesterol    Hypertension    Prostate cancer ([place]) 2004   prostate removed    Surgical History: Past Surgical History:  Procedure Laterality Date   CARDIAC SURGERY     FLEXIBLE SIGMOIDOSCOPY N/A 04/04/2019   Procedure: FLEXIBLE SIGMOIDOSCOPY;  Surgeon: Mike Manifold, MD;  Location: [facility] ENDOSCOPY;  Service: Endoscopy;  Laterality: N/A;   IRRIGATION AND DEBRIDEMENT FOOT Right 06/14/2018   Procedure: IRRIGATION AND DEBRIDEMENT FOOT;  Surgeon: Sharlotte Alamo, DPM;  Location: [facility] ORS;  Service: Podiatry;  Laterality: Right;    Allergies:  Allergies  Allergen Reactions   Naproxen Sodium Other (See Comments)    Reaction: Upset stomach   Nsaids Other (See Comments)    Other reaction(s): Unknown   Aleve [Naproxen] Nausea And Vomiting   Ibuprofen Nausea Only    Reaction: Upset stomach     Family History: No family history on file.  Social History:  reports that he has never smoked. He has never used smokeless tobacco. He reports that he does not drink alcohol or use drugs.  ROS: Please see flowsheet from today's date for complete review  of systems.  Physical Exam: There were no vitals taken for this visit.  Constitutional:  Well nourished. Alert and oriented, No acute distress. HEENT: [place] AT, moist mucus membranes.  Trachea midline, no masses. Cardiovascular: No clubbing, cyanosis, or edema. Respiratory: Normal respiratory effort, no increased work of breathing. GI: Abdomen is soft, non tender, non distended, no abdominal masses. Liver and spleen not palpable.  No hernias appreciated.  Stool sample for occult testing is not indicated.   GU: No CVA tenderness.  No  bladder fullness or masses.  Patient with circumcised/uncircumcised phallus. ***Foreskin easily retracted***  Urethral meatus is patent.  No penile discharge. No penile lesions or rashes. Scrotum without lesions, cysts, rashes and/or edema.  Testicles are located scrotally bilaterally. No masses are appreciated in the testicles. Left and right epididymis are normal. Rectal: Patient with  normal sphincter tone. Anus and perineum without scarring or rashes. No rectal masses are appreciated. Prostate is approximately *** grams, *** nodules are appreciated. Seminal vesicles are normal. Skin: No rashes, bruises or suspicious lesions. Lymph: No cervical or inguinal adenopathy. Neurologic: Grossly intact, no focal deficits, moving all 4 extremities. Psychiatric: Normal mood and affect.   Laboratory Data: Reviewed Normal renal function  Pertinent Imaging: CT from 10/9, massively distended bladder mild upstream fullness of the collecting systems, prostate surgically absent  Assessment & Plan:    1. Urinary retention ***  2. BPH with LUTS IPSS score is ***, it is stable/improving/worsening Continue conservative management, avoiding bladder irritants and timed voiding's Most bothersome symptoms is/are *** Initiate alpha-blocker (***), discussed side effects *** Initiate 5 alpha reductase inhibitor (***), discussed side effects *** Continue tamsulosin 0.4 mg daily, alfuzosin 10 mg daily, Rapaflo 8 mg daily, terazosin, doxazosin, Cialis 5 mg daily and finasteride 5 mg daily, dutasteride 0.5 mg daily***:refills given Cannot tolerate medication or medication failure, schedule cystoscopy *** RTC in *** months for IPSS, PSA, PVR and exam   3. History of prostate cancer  PSA in 03/2019 was 0.92  Mike [name], [facility] [address] [place],  [address] [phone]

## 2019-05-28 ENCOUNTER — Ambulatory Visit: Payer: Medicare Other | Admitting: Urology

## 2019-05-28 ENCOUNTER — Encounter: Payer: Self-pay | Admitting: Urology

## 2019-05-29 ENCOUNTER — Inpatient Hospital Stay: Payer: Medicare Other

## 2019-05-29 ENCOUNTER — Other Ambulatory Visit: Payer: Self-pay | Admitting: *Deleted

## 2019-05-29 ENCOUNTER — Encounter: Payer: Self-pay | Admitting: Oncology

## 2019-05-29 ENCOUNTER — Other Ambulatory Visit: Payer: Self-pay

## 2019-05-29 ENCOUNTER — Inpatient Hospital Stay: Payer: Medicare Other | Attending: Oncology | Admitting: Oncology

## 2019-05-29 VITALS — BP 153/59 | HR 57 | Temp 99.6°F | Resp 16 | Wt 223.7 lb

## 2019-05-29 DIAGNOSIS — C9200 Acute myeloblastic leukemia, not having achieved remission: Secondary | ICD-10-CM | POA: Insufficient documentation

## 2019-05-29 DIAGNOSIS — Z7189 Other specified counseling: Secondary | ICD-10-CM | POA: Diagnosis not present

## 2019-05-29 DIAGNOSIS — Z993 Dependence on wheelchair: Secondary | ICD-10-CM | POA: Diagnosis not present

## 2019-05-29 LAB — CBC WITH DIFFERENTIAL/PLATELET
Abs Immature Granulocytes: 0.06 10*3/uL (ref 0.00–0.07)
Basophils Absolute: 0 10*3/uL (ref 0.0–0.1)
Basophils Relative: 0 %
Eosinophils Absolute: 0.1 10*3/uL (ref 0.0–0.5)
Eosinophils Relative: 2 %
HCT: 23.3 % — ABNORMAL LOW (ref 39.0–52.0)
Hemoglobin: 6.7 g/dL — ABNORMAL LOW (ref 13.0–17.0)
Immature Granulocytes: 3 %
Lymphocytes Relative: 68 %
Lymphs Abs: 1.6 10*3/uL (ref 0.7–4.0)
MCH: 23.7 pg — ABNORMAL LOW (ref 26.0–34.0)
MCHC: 28.8 g/dL — ABNORMAL LOW (ref 30.0–36.0)
MCV: 82.3 fL (ref 80.0–100.0)
Monocytes Absolute: 0.1 10*3/uL (ref 0.1–1.0)
Monocytes Relative: 4 %
Neutro Abs: 0.5 10*3/uL — ABNORMAL LOW (ref 1.7–7.7)
Neutrophils Relative %: 23 %
Platelets: 135 10*3/uL — ABNORMAL LOW (ref 150–400)
RBC: 2.83 MIL/uL — ABNORMAL LOW (ref 4.22–5.81)
RDW: 19.2 % — ABNORMAL HIGH (ref 11.5–15.5)
WBC: 2.3 10*3/uL — ABNORMAL LOW (ref 4.0–10.5)
nRBC: 1.3 % — ABNORMAL HIGH (ref 0.0–0.2)

## 2019-05-29 LAB — COMPREHENSIVE METABOLIC PANEL
ALT: 8 U/L (ref 0–44)
AST: 13 U/L — ABNORMAL LOW (ref 15–41)
Albumin: 3.9 g/dL (ref 3.5–5.0)
Alkaline Phosphatase: 110 U/L (ref 38–126)
Anion gap: 9 (ref 5–15)
BUN: 9 mg/dL (ref 8–23)
CO2: 23 mmol/L (ref 22–32)
Calcium: 9 mg/dL (ref 8.9–10.3)
Chloride: 106 mmol/L (ref 98–111)
Creatinine, Ser: 0.94 mg/dL (ref 0.61–1.24)
GFR calc Af Amer: >60 mL/min (ref >60)
GFR calc non Af Amer: >60 mL/min (ref >60)
Glucose, Bld: 92 mg/dL (ref 70–99)
Potassium: 3.6 mmol/L (ref 3.5–5.1)
Sodium: 138 mmol/L (ref 135–145)
Total Bilirubin: 1.1 mg/dL (ref 0.3–1.2)
Total Protein: 7 g/dL (ref 6.5–8.1)

## 2019-05-29 MED ORDER — OXYCODONE HCL 5 MG PO TABS: 5.0000 mg | ORAL_TABLET | Freq: Four times a day (QID) | ORAL | 0 refills | Status: AC | PRN

## 2019-05-29 NOTE — Progress Notes (Signed)
Pt does not know most of meds but now the sister found a paper from [facility] and I will review it. He is having pain in mutiple areas-left leg is swollen per pt and it is not new, left arm hurting and that is the arm with clot. And he has HA and he did not take any pain med [facility].

## 2019-05-30 NOTE — Progress Notes (Signed)
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  He has a past medical history significant for hypertension hyperlipidemia diabetes and depression among other medical problems.  During his hospital admission patient was found to have pancytopenia.  His platelet counts recovered but he continued to have significant anemia for which she underwent a bone marrow biopsy.Bone marrow biopsy showed hypercellular marrow with involvement of acute myeloid leukemia.  Blasts were present 30 to 40% of cellular ambulance by immunohistochemistry.  Cytogenetics showed complex karyotype.  Patient was supposed to see Dr. Mike Gip upon discharge.  However patient went to the rehab and has not followed up with hematology since then.   Interval history-patient is currently at a rehab and plan is to transition him to long-term facility at [facility].  He is here with his sister today.  He reports feeling fatigued.  Overall he appears improved since his hospitalization and does not appear confused.  ECOG PS- 2 Pain scale- 0 Opioid associated constipation- no  Review of systems- Review of Systems  Constitutional: Positive for malaise/fatigue. Negative for chills, fever and weight loss.  HENT: Negative for congestion, ear discharge and nosebleeds.   Eyes: Negative for blurred vision.  Respiratory: Negative for cough, hemoptysis, sputum production, shortness of breath and wheezing.   Cardiovascular: Positive for leg swelling. Negative for chest  pain, palpitations, orthopnea and claudication.  Gastrointestinal: Negative for abdominal pain, blood in stool, constipation, diarrhea, heartburn, melena, nausea and vomiting.  Genitourinary: Negative for dysuria, flank pain, frequency, hematuria and urgency.  Musculoskeletal: Negative for back pain, joint pain and myalgias.  Skin: Negative for rash.  Neurological: Negative for dizziness, tingling, focal weakness, seizures, weakness and headaches.  Endo/Heme/Allergies: Does not bruise/bleed easily.  Psychiatric/Behavioral: Negative for depression and suicidal ideas. The patient does not have insomnia.       Allergies  Allergen Reactions  . Naproxen Sodium Other (See Comments)    Reaction: Upset stomach  . Nsaids Other (See Comments)    Other reaction(s): Unknown  . Aleve [Naproxen] Nausea And Vomiting  . Ibuprofen Nausea Only    Reaction: Upset stomach      Past Medical History:  Diagnosis Date  . AML (acute myeloblastic leukemia) ([place])   . Anemia   . Anxiety   . Atherosclerotic heart disease   . Benign prostatic hyperplasia   . Depression   . Diabetes mellitus without complication ([place])   . DVT of upper extremity (deep vein thrombosis) ([facility])    left arm  . Epilepsy ([place])   . Epilepsy ([place])   . GERD (gastroesophageal reflux disease)   . High cholesterol   . Hypertension   . Prostate cancer [facility]) 2004   prostate removed     Past Surgical History:  Procedure Laterality Date  . CARDIAC SURGERY    . FLEXIBLE SIGMOIDOSCOPY N/A 04/04/2019   Procedure: FLEXIBLE SIGMOIDOSCOPY;  Surgeon: Virgel Manifold, MD;  Location: [facility] ENDOSCOPY;  Service: Endoscopy;  Laterality: N/A;  . IRRIGATION AND DEBRIDEMENT FOOT Right 06/14/2018   Procedure: IRRIGATION AND DEBRIDEMENT FOOT;  Surgeon:  Sharlotte Alamo, DPM;  Location: [facility] ORS;  Service: Podiatry;  Laterality: Right;    Social History   Socioeconomic History  . Marital status: Single    Spouse name: Not on file  . Number  of children: Not on file  . Years of education: Not on file  . Highest education level: Not on file  Occupational History  . Not on file  Tobacco Use  . Smoking status: Never Smoker  . Smokeless tobacco: Never Used  Substance and Sexual Activity  . Alcohol use: No    Comment: not since he was in hospital and nursing facilities  . Drug use: No  . Sexual activity: Not Currently  Other Topics Concern  . Not on file  Social History Narrative  . Not on file   Social Determinants of Health   Financial Resource Strain:   . Difficulty of Paying Living Expenses: Not on file  Food Insecurity:   . Worried About Charity fundraiser in the Last Year: Not on file  . Ran Out of Food in the Last Year: Not on file  Transportation Needs:   . Lack of Transportation (Medical): Not on file  . Lack of Transportation (Non-Medical): Not on file  Physical Activity:   . Days of Exercise per Week: Not on file  . Minutes of Exercise per Session: Not on file  Stress:   . Feeling of Stress : Not on file  Social Connections:   . Frequency of Communication with Friends and Family: Not on file  . Frequency of Social Gatherings with Friends and Family: Not on file  . Attends Religious Services: Not on file  . Active Member of Clubs or Organizations: Not on file  . Attends Archivist Meetings: Not on file  . Marital Status: Not on file  Intimate Partner Violence:   . Fear of Current or Ex-Partner: Not on file  . Emotionally Abused: Not on file  . Physically Abused: Not on file  . Sexually Abused: Not on file    History reviewed. No pertinent family history.   Current Outpatient Medications:  .  acetaminophen (TYLENOL) 325 MG tablet, Take 650 mg by mouth every 6 (six) hours as needed., Disp: , Rfl:  .  acetaminophen-codeine (TYLENOL #3) 300-30 MG tablet, Take 1 tablet by mouth every 8 (eight) hours as needed for moderate pain., Disp: 20 tablet, Rfl: 0 .  ALPRAZolam (XANAX) 0.5 MG tablet,  Take 0.5 mg by mouth 2 (two) times daily. , Disp: , Rfl:  .  apixaban (ELIQUIS) 5 MG TABS tablet, Take 2 tablets (41m) by mouth twice a day for 6 more days, then take 1 tablet (554m by mouth twice a day, Disp: 60 tablet, Rfl: 0 .  aspirin EC 81 MG tablet, Take 81 mg by mouth daily., Disp: , Rfl:  .  atorvastatin (LIPITOR) 20 MG tablet, Take 20 mg by mouth daily., Disp: , Rfl:  .  Cholecalciferol (VITAMIN D-1000 MAX ST) 25 MCG (1000 UT) tablet, Take 1,000 Units by mouth daily., Disp: , Rfl:  .  clonazePAM (KLONOPIN) 0.5 MG tablet, Take 1 tablet (0.5 mg total) by mouth 2 (two) times daily., Disp: 60 tablet, Rfl: 0 .  Cyanocobalamin (VITAMIN B-12 CR) 1000 MCG TBCR, Take 2,000 mcg by mouth daily. , Disp: , Rfl:  .  Dimethicone-Zinc Oxide-Vit A-D (A & D ZINC OXIDE) CREA, Apply topically 2 (two) times daily., Disp: , Rfl:  .  docusate sodium (COLACE) 100 MG capsule, Take  200 mg by mouth at bedtime., Disp: , Rfl:  .  ferrous sulfate 325 (65 FE) MG tablet, Take 1 tablet (325 mg total) by mouth 2 (two) times daily with a meal., Disp: 60 tablet, Rfl: 3 .  furosemide (LASIX) 40 MG tablet, Take 40 mg by mouth daily as needed for edema., Disp: , Rfl:  .  gabapentin (NEURONTIN) 300 MG capsule, Take 1,200 mg by mouth at bedtime., Disp: , Rfl:  .  glimepiride (AMARYL) 2 MG tablet, Take 2-4 mg by mouth 2 (two) times daily. 4 mg in the morning and 2 mg in the evening, Disp: , Rfl:  .  hydrOXYzine (ATARAX/VISTARIL) 25 MG tablet, Take 25-50 mg by mouth 2 (two) times daily. 25 mg in the morning and 50 mg at bedtime, Disp: , Rfl:  .  insulin aspart (NOVOLOG) 100 UNIT/ML injection, Inject 2-15 Units into the skin 3 (three) times daily with meals. Per sliding scale 121-150= 2 units 151-200= 3 units 201-250= 5 units 251-300= 8 units 301-350= 11 units 351-400= 15 units, Disp: , Rfl:  .  insulin glargine (LANTUS) 100 UNIT/ML injection, Inject 5 Units into the skin daily., Disp: , Rfl:  .  lacosamide 100 MG TABS, Take 1 tablet  (100 mg total) by mouth 2 (two) times daily., Disp: 60 tablet, Rfl: 0 .  lidocaine (XYLOCAINE) 2 % jelly, Apply 1 application topically every 6 (six) hours as needed (penis pain)., Disp: , Rfl:  .  naphazoline-glycerin (CLEAR EYES REDNESS) 0.012-0.2 % SOLN, Place 2 drops into both eyes daily., Disp: 30 mL, Rfl: 0 .  nitroGLYCERIN (NITROSTAT) 0.4 MG SL tablet, Take 0.4 mg by mouth every 5 (five) minutes as needed for chest pain. , Disp: , Rfl:  .  nystatin (MYCOSTATIN) 100000 UNIT/ML suspension, Take 5 mLs by mouth 4 (four) times daily., Disp: , Rfl:  .  Omega-3 1000 MG CAPS, Take 1,000 mg by mouth daily., Disp: , Rfl:  .  omeprazole (PRILOSEC) 20 MG capsule, Take 20 mg by mouth daily. , Disp: , Rfl:  .  PARoxetine (PAXIL) 20 MG tablet, Take 1 tablet (20 mg total) by mouth daily., Disp: 30 tablet, Rfl: 1 .  pioglitazone (ACTOS) 30 MG tablet, Take 30 mg by mouth daily., Disp: , Rfl:  .  polyethylene glycol (MIRALAX / GLYCOLAX) 17 g packet, Take 17 g by mouth daily as needed for moderate constipation., Disp: 14 each, Rfl: 0 .  potassium chloride (KLOR-CON) 10 MEQ tablet, Take 10 mEq by mouth daily as needed (when taking furosemide). , Disp: , Rfl:  .  sennosides-docusate sodium (SENOKOT-S) 8.6-50 MG tablet, Take 1 tablet by mouth 2 (two) times daily., Disp: , Rfl:  .  tamsulosin (FLOMAX) 0.4 MG CAPS capsule, Take 1 capsule (0.4 mg total) by mouth daily., Disp: 30 capsule, Rfl: 0 .  oxyCODONE (OXY IR/ROXICODONE) 5 MG immediate release tablet, Take 1 tablet (5 mg total) by mouth every 6 (six) hours as needed for severe pain., Disp: 60 tablet, Rfl: 0  Physical exam:  Vitals:   05/29/19 1415 05/29/19 1418  BP: (!) 153/59   Pulse: (!) 57   Resp: 16   Temp:  99.6 F (37.6 C)  TempSrc: Tympanic Tympanic  Weight: 223 lb 11.2 oz (101.5 kg)    Physical Exam Constitutional:      Comments: Elderly gentleman sitting in a wheelchair.  Appears in no acute distress  HENT:     Head: Normocephalic and  atraumatic.  Eyes:  Pupils: Pupils are equal, round, and reactive to light.  Cardiovascular:     Rate and Rhythm: Normal rate and regular rhythm.     Heart sounds: Normal heart sounds.  Pulmonary:     Effort: Pulmonary effort is normal.     Breath sounds: Normal breath sounds.  Abdominal:     General: Bowel sounds are normal.     Palpations: Abdomen is soft.  Musculoskeletal:     Cervical back: Normal range of motion.     Right lower leg: Edema present.     Left lower leg: Edema present.  Skin:    General: Skin is warm and dry.  Neurological:     Mental Status: He is alert and oriented to person, place, and time.      CMP Latest Ref Rng & Units 05/29/2019  Glucose 70 - 99 mg/dL 92  BUN 8 - 23 mg/dL 9  Creatinine 0.61 - 1.24 mg/dL 0.94  Sodium 135 - 145 mmol/L 138  Potassium 3.5 - 5.1 mmol/L 3.6  Chloride 98 - 111 mmol/L 106  CO2 22 - 32 mmol/L 23  Calcium 8.9 - 10.3 mg/dL 9.0  Total Protein 6.5 - 8.1 g/dL 7.0  Total Bilirubin 0.3 - 1.2 mg/dL 1.1  Alkaline Phos 38 - 126 U/L 110  AST 15 - 41 U/L 13(L)  ALT 0 - 44 U/L 8   CBC Latest Ref Rng & Units 05/29/2019  WBC 4.0 - 10.5 K/uL 2.3(L)  Hemoglobin 13.0 - 17.0 g/dL 6.7(L)  Hematocrit 39.0 - 52.0 % 23.3(L)  Platelets 150 - 400 K/uL 135(L)      Assessment and plan- Patient is a 73 y.o. 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  Discussed that patient is not a candidate for any intensive chemotherapy and bone marrow transplantation.  Certainly treatment with hypomethylating agent such as azacitidine plus or minus venetoclax can be considered.  This would involve patient coming to the cancer centers 7 days a week every month to receive his infusion as well as for blood draws and frequent follow-ups.  Treatment would be palliative and not curative.  Untreated AML carries a prognosis of less than 6  months.  Treatment coould prolong his life expectancy to 10 months to a year.  Discussed risks and benefits of Vidaza including all but not limited to nausea, vomiting, low blood counts, risk of infections and hospitalizations.  Patient and his sister understand the overall picture of AML.  Patient understands that his overall prognosis is poor without treatment.  Patient states that he has had a good life and does not wish to undergo any chemotherapy or come for frequent doctors visits.  He wishes to remain comfortable and only focus on his quality of life.  Patient does have a hospice informational meeting coming up in the next 1 to 2 days and he would like to pursue hospice services at his extended care facility at [facility].  Patient's main wish currently is to drive by his house when he has not been over the last 3 months but spent a lot of time in his house.  Patient sister is hoping to make that happen.  No further follow-up needed   Total face to face encounter time for this patient visit was 30 min. >50% of the time was  spent in counseling and coordination of care.  Visit Diagnosis 1. Goals of care, counseling/discussion   2. Acute myeloid leukemia not having achieved remission ([place])      Dr. Randa Evens, MD, MPH [facility] at [facility] [phone] 05/30/2019 11:32 AM

## 2019-06-02 ENCOUNTER — Encounter: Payer: Self-pay | Admitting: Oncology

## 2019-06-02 DIAGNOSIS — Z7189 Other specified counseling: Secondary | ICD-10-CM | POA: Insufficient documentation

## 2019-06-19 ENCOUNTER — Other Ambulatory Visit: Payer: Self-pay

## 2019-06-19 ENCOUNTER — Inpatient Hospital Stay
Admission: EM | Admit: 2019-06-19 | Discharge: 2019-07-21 | DRG: 871 | Disposition: E | Source: Skilled Nursing Facility | Attending: Internal Medicine | Admitting: Internal Medicine

## 2019-06-19 ENCOUNTER — Emergency Department

## 2019-06-19 ENCOUNTER — Inpatient Hospital Stay

## 2019-06-19 DIAGNOSIS — I10 Essential (primary) hypertension: Secondary | ICD-10-CM | POA: Diagnosis present

## 2019-06-19 DIAGNOSIS — N400 Enlarged prostate without lower urinary tract symptoms: Secondary | ICD-10-CM | POA: Diagnosis present

## 2019-06-19 DIAGNOSIS — G40909 Epilepsy, unspecified, not intractable, without status epilepticus: Secondary | ICD-10-CM | POA: Diagnosis present

## 2019-06-19 DIAGNOSIS — Z7189 Other specified counseling: Secondary | ICD-10-CM | POA: Diagnosis not present

## 2019-06-19 DIAGNOSIS — E7800 Pure hypercholesterolemia, unspecified: Secondary | ICD-10-CM | POA: Diagnosis present

## 2019-06-19 DIAGNOSIS — Z8546 Personal history of malignant neoplasm of prostate: Secondary | ICD-10-CM

## 2019-06-19 DIAGNOSIS — J9601 Acute respiratory failure with hypoxia: Secondary | ICD-10-CM | POA: Diagnosis present

## 2019-06-19 DIAGNOSIS — Z86718 Personal history of other venous thrombosis and embolism: Secondary | ICD-10-CM

## 2019-06-19 DIAGNOSIS — R6520 Severe sepsis without septic shock: Secondary | ICD-10-CM | POA: Diagnosis present

## 2019-06-19 DIAGNOSIS — I2510 Atherosclerotic heart disease of native coronary artery without angina pectoris: Secondary | ICD-10-CM | POA: Diagnosis present

## 2019-06-19 DIAGNOSIS — Z515 Encounter for palliative care: Secondary | ICD-10-CM | POA: Diagnosis not present

## 2019-06-19 DIAGNOSIS — K219 Gastro-esophageal reflux disease without esophagitis: Secondary | ICD-10-CM | POA: Diagnosis present

## 2019-06-19 DIAGNOSIS — Z66 Do not resuscitate: Secondary | ICD-10-CM | POA: Diagnosis present

## 2019-06-19 DIAGNOSIS — Z7901 Long term (current) use of anticoagulants: Secondary | ICD-10-CM

## 2019-06-19 DIAGNOSIS — Z886 Allergy status to analgesic agent status: Secondary | ICD-10-CM

## 2019-06-19 DIAGNOSIS — A419 Sepsis, unspecified organism: Secondary | ICD-10-CM

## 2019-06-19 DIAGNOSIS — C9200 Acute myeloblastic leukemia, not having achieved remission: Secondary | ICD-10-CM | POA: Diagnosis present

## 2019-06-19 DIAGNOSIS — Z794 Long term (current) use of insulin: Secondary | ICD-10-CM

## 2019-06-19 DIAGNOSIS — N179 Acute kidney failure, unspecified: Secondary | ICD-10-CM | POA: Diagnosis present

## 2019-06-19 DIAGNOSIS — J1282 Pneumonia due to coronavirus disease 2019: Secondary | ICD-10-CM | POA: Diagnosis present

## 2019-06-19 DIAGNOSIS — G893 Neoplasm related pain (acute) (chronic): Secondary | ICD-10-CM | POA: Diagnosis present

## 2019-06-19 DIAGNOSIS — E162 Hypoglycemia, unspecified: Secondary | ICD-10-CM | POA: Diagnosis present

## 2019-06-19 DIAGNOSIS — J9600 Acute respiratory failure, unspecified whether with hypoxia or hypercapnia: Secondary | ICD-10-CM | POA: Diagnosis not present

## 2019-06-19 DIAGNOSIS — F419 Anxiety disorder, unspecified: Secondary | ICD-10-CM | POA: Diagnosis present

## 2019-06-19 DIAGNOSIS — E11649 Type 2 diabetes mellitus with hypoglycemia without coma: Secondary | ICD-10-CM | POA: Diagnosis present

## 2019-06-19 DIAGNOSIS — Z79899 Other long term (current) drug therapy: Secondary | ICD-10-CM

## 2019-06-19 DIAGNOSIS — Z79891 Long term (current) use of opiate analgesic: Secondary | ICD-10-CM

## 2019-06-19 DIAGNOSIS — A4189 Other specified sepsis: Secondary | ICD-10-CM | POA: Diagnosis present

## 2019-06-19 DIAGNOSIS — Z8619 Personal history of other infectious and parasitic diseases: Secondary | ICD-10-CM

## 2019-06-19 DIAGNOSIS — Z7982 Long term (current) use of aspirin: Secondary | ICD-10-CM

## 2019-06-19 DIAGNOSIS — F339 Major depressive disorder, recurrent, unspecified: Secondary | ICD-10-CM | POA: Diagnosis present

## 2019-06-19 DIAGNOSIS — Z9079 Acquired absence of other genital organ(s): Secondary | ICD-10-CM

## 2019-06-19 DIAGNOSIS — Z8673 Personal history of transient ischemic attack (TIA), and cerebral infarction without residual deficits: Secondary | ICD-10-CM

## 2019-06-19 DIAGNOSIS — U071 COVID-19: Secondary | ICD-10-CM | POA: Diagnosis present

## 2019-06-19 LAB — URINALYSIS, COMPLETE (UACMP) WITH MICROSCOPIC
Bacteria, UA: NONE SEEN
Bilirubin Urine: NEGATIVE
Glucose, UA: NEGATIVE mg/dL
Hgb urine dipstick: NEGATIVE
Ketones, ur: NEGATIVE mg/dL
Leukocytes,Ua: NEGATIVE
Nitrite: NEGATIVE
Protein, ur: 100 mg/dL — AB
Specific Gravity, Urine: 1.019 (ref 1.005–1.030)
pH: 5 (ref 5.0–8.0)

## 2019-06-19 LAB — CBC WITH DIFFERENTIAL/PLATELET
Abs Immature Granulocytes: 0 10*3/uL (ref 0.00–0.07)
Basophils Absolute: 0 10*3/uL (ref 0.0–0.1)
Basophils Relative: 0 %
Eosinophils Absolute: 0 10*3/uL (ref 0.0–0.5)
Eosinophils Relative: 0 %
HCT: 23.8 % — ABNORMAL LOW (ref 39.0–52.0)
Hemoglobin: 6.9 g/dL — ABNORMAL LOW (ref 13.0–17.0)
Lymphocytes Relative: 64 %
Lymphs Abs: 0.9 10*3/uL (ref 0.7–4.0)
MCH: 23.5 pg — ABNORMAL LOW (ref 26.0–34.0)
MCHC: 29 g/dL — ABNORMAL LOW (ref 30.0–36.0)
MCV: 81.2 fL (ref 80.0–100.0)
Monocytes Absolute: 0 10*3/uL — ABNORMAL LOW (ref 0.1–1.0)
Monocytes Relative: 3 %
Neutro Abs: 0.5 10*3/uL — ABNORMAL LOW (ref 1.7–7.7)
Neutrophils Relative %: 33 %
Platelets: 86 10*3/uL — ABNORMAL LOW (ref 150–400)
RBC: 2.93 MIL/uL — ABNORMAL LOW (ref 4.22–5.81)
RDW: 18.1 % — ABNORMAL HIGH (ref 11.5–15.5)
Smear Review: UNDETERMINED
WBC: 1.4 10*3/uL — CL (ref 4.0–10.5)
nRBC: 67 /100 WBC — ABNORMAL HIGH

## 2019-06-19 LAB — PROTIME-INR
INR: 1.7 — ABNORMAL HIGH (ref 0.8–1.2)
Prothrombin Time: 20.2 seconds — ABNORMAL HIGH (ref 11.4–15.2)

## 2019-06-19 LAB — LACTIC ACID, PLASMA
Lactic Acid, Venous: 2.3 mmol/L (ref 0.5–1.9)
Lactic Acid, Venous: 1.7 mmol/L (ref 0.5–1.9)

## 2019-06-19 LAB — COMPREHENSIVE METABOLIC PANEL
ALT: 23 U/L (ref 0–44)
AST: 53 U/L — ABNORMAL HIGH (ref 15–41)
Albumin: 3.4 g/dL — ABNORMAL LOW (ref 3.5–5.0)
Alkaline Phosphatase: 88 U/L (ref 38–126)
Anion gap: 10 (ref 5–15)
BUN: 16 mg/dL (ref 8–23)
CO2: 29 mmol/L (ref 22–32)
Calcium: 8.6 mg/dL — ABNORMAL LOW (ref 8.9–10.3)
Chloride: 100 mmol/L (ref 98–111)
Creatinine, Ser: 1.14 mg/dL (ref 0.61–1.24)
GFR calc Af Amer: >60 mL/min (ref >60)
GFR calc non Af Amer: >60 mL/min (ref >60)
Glucose, Bld: 53 mg/dL — ABNORMAL LOW (ref 70–99)
Potassium: 4 mmol/L (ref 3.5–5.1)
Sodium: 139 mmol/L (ref 135–145)
Total Bilirubin: 2.7 mg/dL — ABNORMAL HIGH (ref 0.3–1.2)
Total Protein: 7.1 g/dL (ref 6.5–8.1)

## 2019-06-19 LAB — ABO/RH: ABO/RH(D): O POS

## 2019-06-19 LAB — PATHOLOGIST SMEAR REVIEW

## 2019-06-19 LAB — POC SARS CORONAVIRUS 2 AG: SARS Coronavirus 2 Ag: POSITIVE — AB

## 2019-06-19 LAB — BLOOD GAS, VENOUS
Acid-Base Excess: 5.1 mmol/L — ABNORMAL HIGH (ref 0.0–2.0)
Bicarbonate: 32.2 mmol/L — ABNORMAL HIGH (ref 20.0–28.0)
FIO2: 0.21
O2 Saturation: 49.6 %
Patient temperature: 37
pCO2, Ven: 57 mmHg (ref 44.0–60.0)
pH, Ven: 7.36 (ref 7.250–7.430)
pO2, Ven: <31 mmHg — CL (ref 32.0–45.0)

## 2019-06-19 LAB — PROCALCITONIN: Procalcitonin: 2.51 ng/mL

## 2019-06-19 LAB — APTT: aPTT: 49 seconds — ABNORMAL HIGH (ref 24–36)

## 2019-06-19 LAB — AMMONIA: Ammonia: 13 umol/L (ref 9–35)

## 2019-06-19 LAB — FIBRIN DERIVATIVES D-DIMER (ARMC ONLY): Fibrin derivatives D-dimer (ARMC): 1653.92 ng/mL (FEU) — ABNORMAL HIGH (ref 0.00–499.00)

## 2019-06-19 LAB — LIPASE, BLOOD: Lipase: 23 U/L (ref 11–51)

## 2019-06-19 LAB — C-REACTIVE PROTEIN: CRP: 37.1 mg/dL — ABNORMAL HIGH (ref <1.0)

## 2019-06-19 LAB — FERRITIN: Ferritin: 1590 ng/mL — ABNORMAL HIGH (ref 24–336)

## 2019-06-19 MED ORDER — ONDANSETRON HCL 4 MG PO TABS
4.0000 mg | ORAL_TABLET | Freq: Four times a day (QID) | ORAL | Status: DC | PRN
  Filled 2019-06-19: qty 1

## 2019-06-19 MED ORDER — ATORVASTATIN CALCIUM 20 MG PO TABS
20.0000 mg | ORAL_TABLET | Freq: Every day | ORAL | Status: DC
  Administered 2019-06-19: 20 mg via ORAL
  Filled 2019-06-19 (×2): qty 1

## 2019-06-19 MED ORDER — DEXAMETHASONE SODIUM PHOSPHATE 10 MG/ML IJ SOLN
6.0000 mg | INTRAMUSCULAR | Status: DC
  Administered 2019-06-20 – 2019-06-21 (×2): 6 mg via INTRAVENOUS
  Filled 2019-06-19: qty 1
  Filled 2019-06-19: qty 0.6
  Filled 2019-06-19: qty 1

## 2019-06-19 MED ORDER — SODIUM CHLORIDE 0.9 % IV SOLN
2.0000 g | INTRAVENOUS | Status: DC
  Administered 2019-06-19: 2 g via INTRAVENOUS
  Filled 2019-06-19 (×2): qty 20

## 2019-06-19 MED ORDER — ACETAMINOPHEN 325 MG PO TABS
650.0000 mg | ORAL_TABLET | Freq: Four times a day (QID) | ORAL | Status: DC | PRN
  Administered 2019-06-19: 650 mg via ORAL
  Filled 2019-06-19: qty 2

## 2019-06-19 MED ORDER — HYDROCOD POLST-CPM POLST ER 10-8 MG/5ML PO SUER: 5.0000 mL | Freq: Two times a day (BID) | ORAL | Status: DC | PRN

## 2019-06-19 MED ORDER — SODIUM CHLORIDE 0.9 % IV BOLUS (SEPSIS)
1000.0000 mL | Freq: Once | INTRAVENOUS | Status: AC
  Administered 2019-06-19: 1000 mL via INTRAVENOUS

## 2019-06-19 MED ORDER — MAGNESIUM SULFATE 2 GM/50ML IV SOLN
2.0000 g | INTRAVENOUS | Status: AC
  Administered 2019-06-19: 2 g via INTRAVENOUS
  Filled 2019-06-19: qty 50

## 2019-06-19 MED ORDER — IOHEXOL 350 MG/ML SOLN
75.0000 mL | Freq: Once | INTRAVENOUS | Status: AC | PRN
  Administered 2019-06-19: 18:00:00 75 mL via INTRAVENOUS

## 2019-06-19 MED ORDER — FERROUS SULFATE 325 (65 FE) MG PO TABS
325.0000 mg | ORAL_TABLET | Freq: Two times a day (BID) | ORAL | Status: DC
  Administered 2019-06-19: 325 mg via ORAL
  Filled 2019-06-19 (×4): qty 1

## 2019-06-19 MED ORDER — ZINC SULFATE 220 (50 ZN) MG PO CAPS
220.0000 mg | ORAL_CAPSULE | Freq: Every day | ORAL | Status: DC
  Administered 2019-06-19: 220 mg via ORAL
  Filled 2019-06-19 (×2): qty 1

## 2019-06-19 MED ORDER — ASPIRIN 81 MG PO TBEC
81.0000 mg | DELAYED_RELEASE_TABLET | Freq: Every day | ORAL | Status: DC
  Administered 2019-06-19: 81 mg via ORAL
  Filled 2019-06-19 (×3): qty 1

## 2019-06-19 MED ORDER — GUAIFENESIN-DM 100-10 MG/5ML PO SYRP
10.0000 mL | ORAL_SOLUTION | ORAL | Status: DC | PRN
  Filled 2019-06-19: qty 10

## 2019-06-19 MED ORDER — ONDANSETRON HCL 4 MG/2ML IJ SOLN: 4.0000 mg | Freq: Four times a day (QID) | INTRAMUSCULAR | Status: DC | PRN

## 2019-06-19 MED ORDER — PAROXETINE HCL 20 MG PO TABS
20.0000 mg | ORAL_TABLET | Freq: Every day | ORAL | Status: DC
  Administered 2019-06-19: 20 mg via ORAL
  Filled 2019-06-19 (×3): qty 1

## 2019-06-19 MED ORDER — APIXABAN 5 MG PO TABS
5.0000 mg | ORAL_TABLET | Freq: Two times a day (BID) | ORAL | Status: DC
  Administered 2019-06-19: 5 mg via ORAL
  Filled 2019-06-19 (×2): qty 1

## 2019-06-19 MED ORDER — ASCORBIC ACID 500 MG PO TABS
500.0000 mg | ORAL_TABLET | Freq: Every day | ORAL | Status: DC
  Administered 2019-06-19: 500 mg via ORAL
  Filled 2019-06-19 (×2): qty 1

## 2019-06-19 MED ORDER — IPRATROPIUM BROMIDE HFA 17 MCG/ACT IN AERS
2.0000 | INHALATION_SPRAY | Freq: Four times a day (QID) | RESPIRATORY_TRACT | Status: DC
  Administered 2019-06-19 – 2019-06-22 (×9): 2 via RESPIRATORY_TRACT
  Filled 2019-06-19 (×2): qty 12.9

## 2019-06-19 MED ORDER — METHYLPREDNISOLONE SODIUM SUCC 125 MG IJ SOLR
125.0000 mg | Freq: Once | INTRAMUSCULAR | Status: AC
  Administered 2019-06-19: 16:00:00 125 mg via INTRAVENOUS
  Filled 2019-06-19: qty 2

## 2019-06-19 MED ORDER — PANTOPRAZOLE SODIUM 40 MG PO TBEC
40.0000 mg | DELAYED_RELEASE_TABLET | Freq: Every day | ORAL | Status: DC
  Administered 2019-06-19: 40 mg via ORAL
  Filled 2019-06-19 (×2): qty 1

## 2019-06-19 MED ORDER — SODIUM CHLORIDE 0.9 % IV SOLN
500.0000 mg | INTRAVENOUS | Status: DC
  Administered 2019-06-19: 500 mg via INTRAVENOUS
  Filled 2019-06-19 (×2): qty 500

## 2019-06-19 MED ORDER — ONDANSETRON HCL 4 MG/2ML IJ SOLN
4.0000 mg | Freq: Once | INTRAMUSCULAR | Status: AC
  Administered 2019-06-19: 4 mg via INTRAVENOUS
  Filled 2019-06-19: qty 2

## 2019-06-19 MED ORDER — SODIUM CHLORIDE 0.9 % IV SOLN
200.0000 mg | Freq: Once | INTRAVENOUS | Status: AC
  Administered 2019-06-19: 18:00:00 200 mg via INTRAVENOUS
  Filled 2019-06-19: qty 200

## 2019-06-19 MED ORDER — TAMSULOSIN HCL 0.4 MG PO CAPS
0.4000 mg | ORAL_CAPSULE | Freq: Every day | ORAL | Status: DC
  Administered 2019-06-19 – 2019-06-22 (×3): 0.4 mg via ORAL
  Filled 2019-06-19 (×6): qty 1

## 2019-06-19 MED ORDER — MORPHINE SULFATE (PF) 4 MG/ML IV SOLN
4.0000 mg | Freq: Once | INTRAVENOUS | Status: AC
  Administered 2019-06-19: 4 mg via INTRAVENOUS
  Filled 2019-06-19: qty 1

## 2019-06-19 MED ORDER — SODIUM CHLORIDE 0.9 % IV SOLN
100.0000 mg | Freq: Every day | INTRAVENOUS | Status: DC
  Filled 2019-06-19: qty 20

## 2019-06-19 NOTE — Progress Notes (Signed)
CODE SEPSIS - PHARMACY COMMUNICATION  **Broad Spectrum Antibiotics should be administered within 1 hour of Sepsis diagnosis**  Time Code Sepsis Called/Page Received: 15:47  Antibiotics Ordered: Ceftriaxone and Azithromycin  Time of 1st antibiotic administration: Ceftriaxone given at 16:19  Additional action taken by pharmacy: n/a  If necessary, Name of Provider/Nurse Contacted: n/a    Mike [name] ,PharmD Clinical Pharmacist  05/27/2019  4:30 PM

## 2019-06-19 NOTE — Progress Notes (Signed)
Remdesivir - Pharmacy Brief Note   O:  ALT: 23 CXR: pleural effusions SpO2:  % on 2L [place]   A/P:  Remdesivir 200 mg IVPB once followed by 100 mg IVPB daily x 4 days.   Paulina Fusi, PharmD, BCPS 06/03/2019 5:41 PM

## 2019-06-19 NOTE — ED Notes (Signed)
Pt pulling mask off and oxygen level remaining stable. Pt switched to [place] on 2L by MD at this time

## 2019-06-19 NOTE — H&P (Signed)
History and Physical  Mike [name] [id number] DOB: [birth year] DOA: 06/13/2019  Referring physician: ED Physician  PCP: Adin Hector, MD  Outpatient Specialists:  1.    Chief Complaint: Shortness of breath and low blood glucose  HPI: Limited history as Pt is not able to provide much info. Obtained from collateral, ER and EMS record.    Mike [name] is a 73 y.o. male  male with a history of AML, diabetes, hypertension, left upper extremity DVT, GERD, epilepsy who was sent to the ED from the [place] facility due to low blood sugar which was found to be 50.  Per EMS report, that on their arrival it was 70 something.  Patient was noted to have an oxygen saturation of about 85% on room air.  He is known to be Covid positive recently.  EMS transferred patient on nonrebreather.  Patient was known Covid positive at the nursing facility.  He was also receiving hospice care.  His COVID-19 antigen tested positive.  And has been given Solu-Medrol in the ER and remdesivir has been started.  Patient was also given empiric antibiotic.  Hospitalist service has been called for further management.  Review of Systems: Unable to obtain as per above reason.   Past Medical History:  Diagnosis Date  . AML (acute myeloblastic leukemia) ([place])   . Anemia   . Anxiety   . Atherosclerotic heart disease   . Benign prostatic hyperplasia   . Depression   . Diabetes mellitus without complication ([place])   . DVT of upper extremity (deep vein thrombosis) ([facility])    left arm  . Epilepsy ([place])   . Epilepsy ([place])   . GERD (gastroesophageal reflux disease)   . High cholesterol   . Hypertension   . Prostate cancer [facility]) 2004   prostate removed   Past Surgical History:  Procedure Laterality Date  . CARDIAC SURGERY    . FLEXIBLE SIGMOIDOSCOPY N/A 04/04/2019   Procedure: FLEXIBLE SIGMOIDOSCOPY;  Surgeon: Virgel Manifold, MD;  Location: [facility] ENDOSCOPY;  Service: Endoscopy;  Laterality: N/A;  .  IRRIGATION AND DEBRIDEMENT FOOT Right 06/14/2018   Procedure: IRRIGATION AND DEBRIDEMENT FOOT;  Surgeon: Sharlotte Alamo, DPM;  Location: [facility] ORS;  Service: Podiatry;  Laterality: Right;   Social History:  reports that he has never smoked. He has never used smokeless tobacco. He reports that he does not drink alcohol or use drugs.   Allergies  Allergen Reactions  . Naproxen Sodium Other (See Comments)    Reaction: Upset stomach  . Nsaids Other (See Comments)    Other reaction(s): Unknown  . Aleve [Naproxen] Nausea And Vomiting  . Ibuprofen Nausea Only    Reaction: Upset stomach     History reviewed. No pertinent family history.    Prior to Admission medications   Medication Sig Start Date End Date Taking? Authorizing Provider  acetaminophen (TYLENOL) 325 MG tablet Take 650 mg by mouth every 6 (six) hours as needed.    [provider]  acetaminophen-codeine (TYLENOL #3) 300-30 MG tablet Take 1 tablet by mouth every 8 (eight) hours as needed for moderate pain. 02/14/19   Gregor Hams, MD  ALPRAZolam Duanne Moron) 0.5 MG tablet Take 0.5 mg by mouth 2 (two) times daily.  12/25/18   [provider]  apixaban (ELIQUIS) 5 MG TABS tablet Take 2 tablets (10mg ) by mouth twice a day for 6 more days, then take 1 tablet (5mg ) by mouth twice a day 04/09/19  Mayo, Pete Pelt, MD  aspirin EC 81 MG tablet Take 81 mg by mouth daily.    [provider]  atorvastatin (LIPITOR) 20 MG tablet Take 20 mg by mouth daily.    [provider]  Cholecalciferol (VITAMIN D-1000 MAX ST) 25 MCG (1000 UT) tablet Take 1,000 Units by mouth daily. 08/30/12   [provider]  clonazePAM (KLONOPIN) 0.5 MG tablet Take 1 tablet (0.5 mg total) by mouth 2 (two) times daily. 04/13/19   Patrecia Pour, NP  Cyanocobalamin (VITAMIN B-12 CR) 1000 MCG TBCR Take 2,000 mcg by mouth daily.  12/27/10   [provider]  Dimethicone-Zinc Oxide-Vit A-D (A & D ZINC OXIDE) CREA Apply topically 2  (two) times daily.    [provider]  docusate sodium (COLACE) 100 MG capsule Take 200 mg by mouth at bedtime.    [provider]  ferrous sulfate 325 (65 FE) MG tablet Take 1 tablet (325 mg total) by mouth 2 (two) times daily with a meal. 04/13/19   Lord, Asa Saunas, NP  furosemide (LASIX) 40 MG tablet Take 40 mg by mouth daily as needed for edema. 02/19/19   [provider]  gabapentin (NEURONTIN) 300 MG capsule Take 1,200 mg by mouth at bedtime.    [provider]  glimepiride (AMARYL) 2 MG tablet Take 2-4 mg by mouth 2 (two) times daily. 4 mg in the morning and 2 mg in the evening 07/19/18   [provider]  hydrOXYzine (ATARAX/VISTARIL) 25 MG tablet Take 25-50 mg by mouth 2 (two) times daily. 25 mg in the morning and 50 mg at bedtime 01/05/15   [provider]  insulin aspart (NOVOLOG) 100 UNIT/ML injection Inject 2-15 Units into the skin 3 (three) times daily with meals. Per sliding scale 121-150= 2 units 151-200= 3 units 201-250= 5 units 251-300= 8 units 301-350= 11 units 351-400= 15 units    [provider]  insulin glargine (LANTUS) 100 UNIT/ML injection Inject 5 Units into the skin daily.    [provider]  lacosamide 100 MG TABS Take 1 tablet (100 mg total) by mouth 2 (two) times daily. 04/09/19   Mayo, Pete Pelt, MD  lidocaine (XYLOCAINE) 2 % jelly Apply 1 application topically every 6 (six) hours as needed (penis pain).    [provider]  naphazoline-glycerin (CLEAR EYES REDNESS) 0.012-0.2 % SOLN Place 2 drops into both eyes daily. 04/09/19   Mayo, Pete Pelt, MD  nitroGLYCERIN (NITROSTAT) 0.4 MG SL tablet Take 0.4 mg by mouth every 5 (five) minutes as needed for chest pain.  07/24/14   [provider]  nystatin (MYCOSTATIN) 100000 UNIT/ML suspension Take 5 mLs by mouth 4 (four) times daily.    [provider]  Omega-3 1000 MG CAPS Take 1,000 mg by mouth daily. 12/23/10   [provider]  omeprazole (PRILOSEC) 20 MG capsule Take 20 mg by mouth daily.  12/19/14   [provider]  oxyCODONE (OXY IR/ROXICODONE) 5 MG immediate release tablet Take 1 tablet (5 mg total) by mouth every 6 (six) hours as needed for severe pain. 05/29/19   Sindy Guadeloupe, MD  PARoxetine (PAXIL) 20 MG tablet Take 1 tablet (20 mg total) by mouth daily. 06/18/18   Gladstone Lighter, MD  pioglitazone (ACTOS) 30 MG tablet Take 30 mg by mouth daily. 01/23/19   [provider]  polyethylene glycol (MIRALAX / GLYCOLAX) 17 g packet Take 17 g by mouth daily as needed for moderate constipation.  04/09/19   Mayo, Pete Pelt, MD  potassium chloride (KLOR-CON) 10 MEQ tablet Take 10 mEq by mouth daily as needed (when taking furosemide).  02/19/19   [provider]  sennosides-docusate sodium (SENOKOT-S) 8.6-50 MG tablet Take 1 tablet by mouth 2 (two) times daily.    [provider]  tamsulosin (FLOMAX) 0.4 MG CAPS capsule Take 1 capsule (0.4 mg total) by mouth daily. 04/10/19   Mayo, Pete Pelt, MD   Physical Exam: Vitals:   06/18/2019 1630 06/06/2019 1700 06/02/2019 1730 06/09/2019 1800  BP: 140/80 113/67 110/64   Pulse: (!) 102 (!) 102  99  Resp:      Temp:      TempSrc:      SpO2: 100% 100%  99%  Weight:      Height:        Constitutional: Alert but not oriented, appears to be ill.  On supplemental oxygen via nasal cannula Eyes:   Conjunctivae are normal. EOMI. PERRL. ENT   Head:   Normocephalic and atraumatic.   Nose:   No congestion/rhinnorhea.    Mouth/Throat:   Dry mucous membranes, no pharyngeal erythema. No peritonsillar mass.    Neck:   No meningismus. Full ROM. Cardiovascular:    Tachycardic with regular rhythm.  No murmur or gallops appreciated.   Respiratory:   Tachypnea;  wheezing.  Bilateral basilar crackles. Gastrointestinal:   Soft and nontender. Non distended. There is no CVA tenderness.  No rebound, rigidity, or guarding. Musculoskeletal:   Normal range  of motion in all extremities. No joint effusions.  No lower extremity tenderness.  No edema. Neurologic:   Mumbling incoherent speech, repetitive phrases..  Motor grossly intact. Skin:    Skin is warm, dry and intact. No rash noted.  No petechiae, purpura, or bullae.  Labs on Admission:  Basic Metabolic Panel: Recent Labs  Lab 05/26/2019 1603  NA 139  K 4.0  CL 100  CO2 29  GLUCOSE 53*  BUN 16  CREATININE 1.14  CALCIUM 8.6*   Liver Function Tests: Recent Labs  Lab 06/07/2019 1603  AST 53*  ALT 23  ALKPHOS 88  BILITOT 2.7*  PROT 7.1  ALBUMIN 3.4*   Recent Labs  Lab 06/14/2019 1603  LIPASE 23   Recent Labs  Lab 05/24/2019 1602  AMMONIA 13   CBC: Recent Labs  Lab 05/24/2019 1603  WBC 1.4*  NEUTROABS 0.5*  HGB 6.9*  HCT 23.8*  MCV 81.2  PLT 86*   Cardiac Enzymes: No results for input(s): CKTOTAL, CKMB, CKMBINDEX, TROPONINI in the last 168 hours.  BNP (last 3 results) No results for input(s): PROBNP in the last 8760 hours. CBG: No results for input(s): GLUCAP in the last 168 hours.  Radiological Exams on Admission: DG Chest Port 1 View  Result Date: 05/23/2019 CLINICAL DATA:  COVID positive, dyspnea, AML EXAM: PORTABLE CHEST 1 VIEW COMPARISON:  04/05/2019 FINDINGS: Increased retrocardiac density. Small left pleural effusion. Cardiomediastinal contours are likely within normal limits for portable technique. There is evidence of prior cardiac surgery. IMPRESSION: Small left pleural effusion. Retrocardiac atelectasis/consolidation. Electronically Signed   By: Macy Mis M.D.   On: 06/10/2019 16:30    EKG: Independently reviewed.  Sinus tachycardia  Assessment/Plan Active Problems:   Acute respiratory failure due to COVID-19 (HCC)  B19 infection with suspected superior imposed bacterial infection/pneumonia and acute respiratory failure suspected sepsis sepsis: She was found to be tachycardic, desaturating possible source of infection from respiratory source  with lactic acid being elevated  to above 2 -Per EMS record patient's O2 saturation was found to be in mid 80s on room air she was placed on nonrebreather  -Culture - improved in the ED as patient was successfully taken off of nonrebreather and currently on 2 L/min supplemental oxygen via nasal cannula -Patient has been given Solu-Medrol in the ER; will continue dexamethasone for total of 10 days -Remdesivir per pharmacy protocol -We will continue empiric antibiotic -Vitamins -Check inflammatory markers -Patient was recently diagnosed with upper extremity DVT and has been taking Eliquis we will start home dose per pharmacy -May check a CT angio of chest to rule out any PE  Hypoglycemia: Per record patient was found to be hypoglycemic in 50s at the facility and when EMS came it was found to be 70s.  -We will Accu-Chek every 2-4 hours -Hold patient's home diabetic medications and any insulin at this time -May start sliding scale insulin when blood glucose stabilized  Diabetes mellitus type 2: We will hold patient's home needs -Blood work in the ED showing blood glucose 53 -We will continue to hold any insulin for now -Blood glucose every 2-4 hours until repeat counts stabilized  AKI: Patient's creatinine was high normal normal on 10 December -Today found to be 1.14 -Likely from dehydration -Continue to monitor -Encourage p.o. 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

## 2019-06-19 NOTE — ED Provider Notes (Addendum)
[facility] Emergency Department Provider Note  ____________________________________________  Time seen: Approximately 5:25 PM  I have reviewed the triage vital signs and the nursing notes.   HISTORY  Chief Complaint Other (covid +) and Hypoglycemia    Level 5 Caveat: Portions of the History and Physical including HPI and review of systems are unable to be completely obtained due to patient being a poor historian   HPI Mike [name] is a 73 y.o. male with a history of AML, diabetes, hypertension, left upper extremity DVT, GERD, epilepsy  who was sent to the ED from the [facility] nursing facility due to low blood sugar which was found to be 50.  EMS report that on their arrival it was 70 something.  Patient was noted to have an oxygen saturation of about 85% on room air.  He is known to be Covid positive recently.  EMS transferred patient on nonrebreather.  Nursing notes that patient's hygiene is very poor on arrival and smells of stale urine.   Past Medical History:  Diagnosis Date  . AML (acute myeloblastic leukemia) ([address])   . Anemia   . Anxiety   . Atherosclerotic heart disease   . Benign prostatic hyperplasia   . Depression   . Diabetes mellitus without complication ([place])   . DVT of upper extremity (deep vein thrombosis) (H[facility])    left arm  . Epilepsy ([place])   . Epilepsy ([place])   . GERD (gastroesophageal reflux disease)   . High cholesterol   . Hypertension   . Prostate cancer [facility]) 2004   prostate removed     Patient Active Problem List   Diagnosis Date Noted  . Goals of care, counseling/discussion 06/02/2019  . AML (acute myeloblastic leukemia) ([place]) 04/12/2019  . Major depressive disorder, recurrent severe without psychotic features ([place]) 04/12/2019  . Acute deep vein thrombosis (DVT) of upper extremity ([place]) 04/10/2019  . Pressure injury of skin 04/05/2019  . Sepsis ([place]) 03/27/2019  . AKI (acute kidney injury) ([place]) 07/24/2018  . Cellulitis  in diabetic foot ([place]) 06/13/2018  . Basal cell carcinoma 03/27/2016  . Allergic conjunctivitis of both eyes 01/19/2016  . Acute left-sided thoracic back pain 09/25/2015  . Chest pain 09/24/2015  . Chronic cough 03/30/2015  . CAD (coronary artery disease), native coronary artery 10/19/2003     Past Surgical History:  Procedure Laterality Date  . CARDIAC SURGERY    . FLEXIBLE SIGMOIDOSCOPY N/A 04/04/2019   Procedure: FLEXIBLE SIGMOIDOSCOPY;  Surgeon: Virgel Manifold, MD;  Location: [facility] ENDOSCOPY;  Service: Endoscopy;  Laterality: N/A;  . IRRIGATION AND DEBRIDEMENT FOOT Right 06/14/2018   Procedure: IRRIGATION AND DEBRIDEMENT FOOT;  Surgeon: Sharlotte Alamo, DPM;  Location: [facility] ORS;  Service: Podiatry;  Laterality: Right;     Prior to Admission medications   Medication Sig Start Date End Date Taking? Authorizing Provider  acetaminophen (TYLENOL) 325 MG tablet Take 650 mg by mouth every 6 (six) hours as needed.    [provider]  acetaminophen-codeine (TYLENOL #3) 300-30 MG tablet Take 1 tablet by mouth every 8 (eight) hours as needed for moderate pain. 02/14/19   Gregor Hams, MD  ALPRAZolam Duanne Moron) 0.5 MG tablet Take 0.5 mg by mouth 2 (two) times daily.  12/25/18   [provider]  apixaban (ELIQUIS) 5 MG TABS tablet Take 2 tablets (10mg ) by mouth twice a day for 6 more days, then take 1 tablet (5mg ) by mouth twice a day 04/09/19   Mayo, Pete Pelt, MD  aspirin EC 81 MG tablet Take 81 mg by mouth daily.    [provider]  atorvastatin (LIPITOR) 20 MG tablet Take 20 mg by mouth daily.    [provider]  Cholecalciferol (VITAMIN D-1000 MAX ST) 25 MCG (1000 UT) tablet Take 1,000 Units by mouth daily. 08/30/12   [provider]  clonazePAM (KLONOPIN) 0.5 MG tablet Take 1 tablet (0.5 mg total) by mouth 2 (two) times daily. 04/13/19   Patrecia Pour, NP  Cyanocobalamin (VITAMIN B-12 CR) 1000 MCG TBCR Take 2,000 mcg by mouth daily.  12/27/10    [provider]  Dimethicone-Zinc Oxide-Vit A-D (A & D ZINC OXIDE) CREA Apply topically 2 (two) times daily.    [provider]  docusate sodium (COLACE) 100 MG capsule Take 200 mg by mouth at bedtime.    [provider]  ferrous sulfate 325 (65 FE) MG tablet Take 1 tablet (325 mg total) by mouth 2 (two) times daily with a meal. 04/13/19   Lord, Asa Saunas, NP  furosemide (LASIX) 40 MG tablet Take 40 mg by mouth daily as needed for edema. 02/19/19   [provider]  gabapentin (NEURONTIN) 300 MG capsule Take 1,200 mg by mouth at bedtime.    [provider]  glimepiride (AMARYL) 2 MG tablet Take 2-4 mg by mouth 2 (two) times daily. 4 mg in the morning and 2 mg in the evening 07/19/18   [provider]  hydrOXYzine (ATARAX/VISTARIL) 25 MG tablet Take 25-50 mg by mouth 2 (two) times daily. 25 mg in the morning and 50 mg at bedtime 01/05/15   [provider]  insulin aspart (NOVOLOG) 100 UNIT/ML injection Inject 2-15 Units into the skin 3 (three) times daily with meals. Per sliding scale 121-150= 2 units 151-200= 3 units 201-250= 5 units 251-300= 8 units 301-350= 11 units 351-400= 15 units    [provider]  insulin glargine (LANTUS) 100 UNIT/ML injection Inject 5 Units into the skin daily.    [provider]  lacosamide 100 MG TABS Take 1 tablet (100 mg total) by mouth 2 (two) times daily. 04/09/19   Mayo, Pete Pelt, MD  lidocaine (XYLOCAINE) 2 % jelly Apply 1 application topically every 6 (six) hours as needed (penis pain).    [provider]  naphazoline-glycerin (CLEAR EYES REDNESS) 0.012-0.2 % SOLN Place 2 drops into both eyes daily. 04/09/19   Mayo, Pete Pelt, MD  nitroGLYCERIN (NITROSTAT) 0.4 MG SL tablet Take 0.4 mg by mouth every 5 (five) minutes as needed for chest pain.  07/24/14   [provider]  nystatin (MYCOSTATIN) 100000 UNIT/ML suspension Take 5 mLs by mouth 4 (four) times daily.     [provider]  Omega-3 1000 MG CAPS Take 1,000 mg by mouth daily. 12/23/10   [provider]  omeprazole (PRILOSEC) 20 MG capsule Take 20 mg by mouth daily.  12/19/14   [provider]  oxyCODONE (OXY IR/ROXICODONE) 5 MG immediate release tablet Take 1 tablet (5 mg total) by mouth every 6 (six) hours as needed for severe pain. 05/29/19   Sindy Guadeloupe, MD  PARoxetine (PAXIL) 20 MG tablet Take 1 tablet (20 mg total) by mouth daily. 06/18/18   Gladstone Lighter, MD  pioglitazone (ACTOS) 30 MG tablet Take 30 mg by mouth daily. 01/23/19   [provider]  polyethylene glycol (MIRALAX / GLYCOLAX) 17 g packet Take 17 g by mouth daily as needed for moderate constipation. 04/09/19   Mayo, Valetta Fuller  Nicola Girt, MD  potassium chloride (KLOR-CON) 10 MEQ tablet Take 10 mEq by mouth daily as needed (when taking furosemide).  02/19/19   [provider]  sennosides-docusate sodium (SENOKOT-S) 8.6-50 MG tablet Take 1 tablet by mouth 2 (two) times daily.    [provider]  tamsulosin (FLOMAX) 0.4 MG CAPS capsule Take 1 capsule (0.4 mg total) by mouth daily. 04/10/19   Mayo, Pete Pelt, MD     Allergies Naproxen sodium, Nsaids, Aleve [naproxen], and Ibuprofen   History reviewed. No pertinent family history.  Social History Social History   Tobacco Use  . Smoking status: Never Smoker  . Smokeless tobacco: Never Used  Substance Use Topics  . Alcohol use: No    Comment: not since he was in hospital and nursing facilities  . Drug use: No    Review of Systems Level 5 Caveat: Portions of the History and Physical including HPI and review of systems are unable to be completely obtained due to patient being a poor historian   Constitutional:   No known fever.  ENT:   No rhinorrhea. Cardiovascular:   No chest pain or syncope. Respiratory:   No dyspnea positive cough. Gastrointestinal:   Negative for abdominal pain, vomiting and diarrhea.  Musculoskeletal:   Negative  for focal pain or swelling ____________________________________________   PHYSICAL EXAM:  VITAL SIGNS: ED Triage Vitals  Enc Vitals Group     BP 06/17/2019 1530 (!) 160/76     Pulse Rate 05/31/2019 1530 (!) 107     Resp 06/08/2019 1530 (!) 28     Temp 05/20/2019 1530 (!) 102.5 F (39.2 C)     Temp Source 05/25/2019 1530 Axillary     SpO2 06/13/2019 1530 100 %     Weight 06/11/2019 1531 230 lb (104.3 kg)     Height 06/02/2019 1531 5\' 10"  (1.778 m)     Head Circumference --      Peak Flow --      Pain Score --      Pain Loc --      Pain Edu? --      Excl. in [place]? --     Vital signs reviewed, nursing assessments reviewed.   Constitutional: Awake, not alert, not oriented.  Ill-appearing..  Eyes:   Conjunctivae are normal. EOMI. PERRL. ENT      Head:   Normocephalic and atraumatic.      Nose:   No congestion/rhinnorhea.       Mouth/Throat:   Dry mucous membranes, no pharyngeal erythema. No peritonsillar mass.       Neck:   No meningismus. Full ROM. Hematological/Lymphatic/Immunilogical:   No cervical lymphadenopathy. Cardiovascular:   Tachycardia heart rate 105. Symmetric bilateral radial and DP pulses.  No murmurs. Cap refill 4 seconds. Respiratory:   Tachypnea, respiratory rate of about 28.  Diffuse expiratory wheezing.  Bilateral basilar crackles. Gastrointestinal:   Soft and nontender. Non distended. There is no CVA tenderness.  No rebound, rigidity, or guarding.  Musculoskeletal:   Normal range of motion in all extremities. No joint effusions.  No lower extremity tenderness.  No edema. Neurologic:   Mumbling incoherent speech, repetitive phrases..  Motor grossly intact.  Skin:    Skin is warm, dry and intact. No rash noted.  No petechiae, purpura, or bullae.  ____________________________________________    LABS (pertinent positives/negatives) (all labs ordered are listed, but only abnormal results are displayed) Labs Reviewed  LACTIC ACID, PLASMA - Abnormal; Notable for the  following components:  Result Value   Lactic Acid, Venous 2.3 (*)    All other components within normal limits  COMPREHENSIVE METABOLIC PANEL - Abnormal; Notable for the following components:   Glucose, Bld 53 (*)    Calcium 8.6 (*)    Albumin 3.4 (*)    AST 53 (*)    Total Bilirubin 2.7 (*)    All other components within normal limits  CBC WITH DIFFERENTIAL/PLATELET - Abnormal; Notable for the following components:   WBC 1.4 (*)    RBC 2.93 (*)    Hemoglobin 6.9 (*)    HCT 23.8 (*)    MCH 23.5 (*)    MCHC 29.0 (*)    RDW 18.1 (*)    Platelets 86 (*)    Neutro Abs 0.5 (*)    Monocytes Absolute 0.0 (*)    nRBC 67 (*)    All other components within normal limits  APTT - Abnormal; Notable for the following components:   aPTT 49 (*)    All other components within normal limits  PROTIME-INR - Abnormal; Notable for the following components:   Prothrombin Time 20.2 (*)    INR 1.7 (*)    All other components within normal limits  URINALYSIS, COMPLETE (UACMP) WITH MICROSCOPIC - Abnormal; Notable for the following components:   Color, Urine AMBER (*)    APPearance HAZY (*)    Protein, ur 100 (*)    All other components within normal limits  BLOOD GAS, VENOUS - Abnormal; Notable for the following components:   pO2, Ven <31.0 (*)    Bicarbonate 32.2 (*)    Acid-Base Excess 5.1 (*)    All other components within normal limits  POC SARS CORONAVIRUS 2 AG - Abnormal; Notable for the following components:   SARS Coronavirus 2 Ag POSITIVE (*)    All other components within normal limits  CULTURE, BLOOD (ROUTINE X 2)  CULTURE, BLOOD (ROUTINE X 2)  URINE CULTURE  LIPASE, BLOOD  PROCALCITONIN  AMMONIA  LACTIC ACID, PLASMA  PATHOLOGIST SMEAR REVIEW  POC SARS CORONAVIRUS 2 AG -  ED   ____________________________________________   EKG  Interpreted by me Sinus tachycardia rate 105.  Normal axis and intervals.  Normal QRS and ST segments.  Slight T wave inversions in V2 and V3.   Slight inferior Q waves.  No significant changes compared to April 14, 2019.  ____________________________________________    RADIOLOGY  DG Chest Port 1 View  Result Date: 05/30/2019 CLINICAL DATA:  COVID positive, dyspnea, AML EXAM: PORTABLE CHEST 1 VIEW COMPARISON:  04/05/2019 FINDINGS: Increased retrocardiac density. Small left pleural effusion. Cardiomediastinal contours are likely within normal limits for portable technique. There is evidence of prior cardiac surgery. IMPRESSION: Small left pleural effusion. Retrocardiac atelectasis/consolidation. Electronically Signed   By: Macy Mis M.D.   On: 05/23/2019 16:30    ____________________________________________   PROCEDURES .Critical Care Performed by: Carrie Mew, MD Authorized by: Carrie Mew, MD   Critical care provider statement:    Critical care time (minutes):  35   Critical care time was exclusive of:  Separately billable procedures and treating other patients   Critical care was necessary to treat or prevent imminent or life-threatening deterioration of the following conditions:  Respiratory failure and sepsis   Critical care was time spent personally by me on the following activities:  Development of treatment plan with patient or surrogate, discussions with consultants, evaluation of patient's response to treatment, examination of patient, obtaining history from patient or surrogate, ordering and  performing treatments and interventions, ordering and review of laboratory studies, ordering and review of radiographic studies, pulse oximetry, re-evaluation of patient's condition, review of old charts and blood draw for specimens    ____________________________________________  DIFFERENTIAL DIAGNOSIS   Covid pneumonitis, bacterial pneumonia with sepsis, UTI, electrolyte abnormality, dehydration, renal failure  CLINICAL IMPRESSION / ASSESSMENT AND PLAN / ED COURSE  Medications ordered in the  ED: Medications  cefTRIAXone (ROCEPHIN) 2 g in sodium chloride 0.9 % 100 mL IVPB (2 g Intravenous New Bag/Given 05/31/2019 1619)  azithromycin (ZITHROMAX) 500 mg in sodium chloride 0.9 % 250 mL IVPB (500 mg Intravenous New Bag/Given 06/13/2019 1622)  remdesivir 200 mg in sodium chloride 0.9% 250 mL IVPB (has no administration in time range)    Followed by  remdesivir 100 mg in sodium chloride 0.9 % 100 mL IVPB (has no administration in time range)  sodium chloride 0.9 % bolus 1,000 mL (1,000 mLs Intravenous New Bag/Given 05/29/2019 1617)  methylPREDNISolone sodium succinate (SOLU-MEDROL) 125 mg/2 mL injection 125 mg (125 mg Intravenous Given 05/22/2019 1618)  magnesium sulfate IVPB 2 g 50 mL (0 g Intravenous Stopped 05/20/2019 1625)    Pertinent labs & imaging results that were available during my care of the patient were reviewed by me and considered in my medical decision making (see chart for details).   Mike [name] was evaluated in Emergency Department on 05/24/2019 for the symptoms described in the history of present illness. He was evaluated in the context of the global COVID-19 pandemic, which necessitated consideration that the patient might be at risk for infection with the SARS-CoV-2 virus that causes COVID-19. Institutional protocols and algorithms that pertain to the evaluation of patients at risk for COVID-19 are in a state of rapid change based on information released by regulatory bodies including the CDC and federal and state organizations. These policies and algorithms were followed during the patient's care in the ED.   Patient presents with hypoxia and confusion, likely encephalopathy due to fever and Covid infection versus sepsis.  Due to his fever tachycardia tachypnea hypoxia and evidence of foul urine, sepsis protocol initiated immediately upon assessment with initiation of ceftriaxone and azithromycin for antibiotic coverage.  There was delay in obtaining lab samples due to  difficulty with vascular access.  I was able to place 2 20-gauge IVs under continuous ultrasound visualization to allow for blood draws.    ----------------------------------------- 5:41 PM on 05/29/2019 -----------------------------------------  Lactate of 2.3.  No hypotension.  No evidence of shock.  Sepsis reassessment has been completed.  Labs are overall unremarkable with reassuring chemistry panel and VBG.  Blood counts are concerning for leukopenia and elevated procalcitonin.  Patient was retested due to lack of records from the [place], point-of-care Covid test was positive.  Case discussed with the hospitalist who agrees with Solu-Medrol and remdesivir pharmacy consult.  Also request a CT angiogram of the chest to rule out PE given his history of DVT.  Hospitalist will evaluate the patient for admission.   ----------------------------------------- 6:44 PM on 05/21/2019 -----------------------------------------  Discussed with the patient's sister who confirms his DNR status.  She emphasized the importance of regular pain control medication due to his severe chronic pain related to cancer.     ____________________________________________   FINAL CLINICAL IMPRESSION(S) / ED DIAGNOSES    Final diagnoses:  Pneumonia due to COVID-19 virus  Acute respiratory failure with hypoxia ([place])  Sepsis with acute hypoxic respiratory failure without septic shock, due to unspecified organism ([place])  ED Discharge Orders    None      Portions of this note were generated with dragon dictation software. Dictation errors may occur despite best attempts at proofreading.   Carrie Mew, MD 06/01/2019 1743    Carrie Mew, MD 05/27/2019 [phone]

## 2019-06-19 NOTE — Progress Notes (Signed)
ANTICOAGULATION CONSULT NOTE - Initial Consult  Pharmacy Consult for Apixaban Indication: h/o DVT  Allergies  Allergen Reactions  . Naproxen Sodium Other (See Comments)    Reaction: Upset stomach  . Nsaids Other (See Comments)    Other reaction(s): Unknown  . Aleve [Naproxen] Nausea And Vomiting  . Ibuprofen Nausea Only    Reaction: Upset stomach     Patient Measurements: Height: 5\' 10"  (177.8 cm) Weight: 230 lb (104.3 kg) IBW/kg (Calculated) : 73 Heparin Dosing Weight:    Vital Signs: Temp: 102.5 F (39.2 C) (12/31 1530) Temp Source: Axillary (12/31 1530) BP: 110/64 (12/31 1730) Pulse Rate: 99 (12/31 1800)  Labs: Recent Labs    06/07/2019 1603  HGB 6.9*  HCT 23.8*  PLT 86*  APTT 49*  LABPROT 20.2*  INR 1.7*  CREATININE 1.14    Estimated Creatinine Clearance: 68.8 mL/min (by C-G formula based on SCr of 1.14 mg/dL).   Medical History: Past Medical History:  Diagnosis Date  . AML (acute myeloblastic leukemia) ([place])   . Anemia   . Anxiety   . Atherosclerotic heart disease   . Benign prostatic hyperplasia   . Depression   . Diabetes mellitus without complication ([place])   . DVT of upper extremity (deep vein thrombosis) ([facility])    left arm  . Epilepsy ([place])   . Epilepsy ([place])   . GERD (gastroesophageal reflux disease)   . High cholesterol   . Hypertension   . Prostate cancer [facility]) 2004   prostate removed    Assessment: Patient is a 73yo male admitted for Covid. Patient with recent h/o of upper extremity DVT and was taking Apixaban 5mg  BID prior to admission.  Plan:  Will continue Apixaban 5mg  BID, follow labs per protocol.  Paulina Fusi, PharmD, BCPS 06/16/2019 6:16 PM

## 2019-06-19 NOTE — ED Triage Notes (Signed)
Pt arrives via ems from the [facility]. Ems reports facility called out due to low blood sugar. Ems reports CBG in 70's.. pt covid +, oxygen in the 80 on room air. On arrival pt is soiled from head to toe in urine and smells like ammonia.  Pt extremely SOB, labored resp, and expiratory wheezing. On non rebreather 13L pt o2 level is 100% but extremely labored. MD present to assess pt.

## 2019-06-19 NOTE — ED Notes (Signed)
Pt sister updated by this RN at this time

## 2019-06-19 NOTE — Progress Notes (Addendum)
Patient is currently followed by Manufacturing engineer at [facility] with a hospice diagnosis of acute myeloblastic leukemia.  This is a related admission per hospice medical director. Patient is a DNR code with out of facility DNR in place at the facility. He was reported COVID positive at facility on 12/29. Per report of [facility] staff, patient was found to have blood sugar of 50 and per request of his sister Enis Slipper he was sent to the [facility] ED for evaluation. Per chart note review patient is currently requiring supplemental oxygen at 13 liters.  CMRN Andreas Newport made aware. Will continue to follow and update hospice team. Flo Shanks BSN, RN, [facility] [phone]

## 2019-06-20 ENCOUNTER — Other Ambulatory Visit: Payer: Self-pay

## 2019-06-20 DIAGNOSIS — Z515 Encounter for palliative care: Secondary | ICD-10-CM

## 2019-06-20 DIAGNOSIS — J9600 Acute respiratory failure, unspecified whether with hypoxia or hypercapnia: Secondary | ICD-10-CM

## 2019-06-20 DIAGNOSIS — U071 COVID-19: Secondary | ICD-10-CM

## 2019-06-20 DIAGNOSIS — Z7189 Other specified counseling: Secondary | ICD-10-CM

## 2019-06-20 LAB — COMPREHENSIVE METABOLIC PANEL
ALT: 21 U/L (ref 0–44)
AST: 35 U/L (ref 15–41)
Albumin: 2.6 g/dL — ABNORMAL LOW (ref 3.5–5.0)
Alkaline Phosphatase: 68 U/L (ref 38–126)
Anion gap: 12 (ref 5–15)
BUN: 24 mg/dL — ABNORMAL HIGH (ref 8–23)
CO2: 25 mmol/L (ref 22–32)
Calcium: 8 mg/dL — ABNORMAL LOW (ref 8.9–10.3)
Chloride: 102 mmol/L (ref 98–111)
Creatinine, Ser: 1.26 mg/dL — ABNORMAL HIGH (ref 0.61–1.24)
GFR calc Af Amer: >60 mL/min (ref >60)
GFR calc non Af Amer: 56 mL/min — ABNORMAL LOW (ref >60)
Glucose, Bld: 165 mg/dL — ABNORMAL HIGH (ref 70–99)
Potassium: 4.1 mmol/L (ref 3.5–5.1)
Sodium: 139 mmol/L (ref 135–145)
Total Bilirubin: 2.1 mg/dL — ABNORMAL HIGH (ref 0.3–1.2)
Total Protein: 5.9 g/dL — ABNORMAL LOW (ref 6.5–8.1)

## 2019-06-20 LAB — TYPE AND SCREEN
ABO/RH(D): O POS
Antibody Screen: NEGATIVE
Unit division: 0

## 2019-06-20 LAB — FERRITIN: Ferritin: 1511 ng/mL — ABNORMAL HIGH (ref 24–336)

## 2019-06-20 LAB — CBC WITH DIFFERENTIAL/PLATELET
Abs Immature Granulocytes: 0 10*3/uL (ref 0.00–0.07)
Basophils Absolute: 0 10*3/uL (ref 0.0–0.1)
Basophils Relative: 0 %
Eosinophils Absolute: 0 10*3/uL (ref 0.0–0.5)
Eosinophils Relative: 0 %
HCT: 21 % — ABNORMAL LOW (ref 39.0–52.0)
Hemoglobin: 5.9 g/dL — ABNORMAL LOW (ref 13.0–17.0)
Lymphocytes Relative: 68 %
Lymphs Abs: 0.9 10*3/uL (ref 0.7–4.0)
MCH: 23 pg — ABNORMAL LOW (ref 26.0–34.0)
MCHC: 28.1 g/dL — ABNORMAL LOW (ref 30.0–36.0)
MCV: 82 fL (ref 80.0–100.0)
Monocytes Absolute: 0 10*3/uL — ABNORMAL LOW (ref 0.1–1.0)
Monocytes Relative: 3 %
Neutro Abs: 0.4 10*3/uL — ABNORMAL LOW (ref 1.7–7.7)
Neutrophils Relative %: 29 %
Platelets: 62 10*3/uL — ABNORMAL LOW (ref 150–400)
RBC: 2.56 MIL/uL — ABNORMAL LOW (ref 4.22–5.81)
RDW: 17.9 % — ABNORMAL HIGH (ref 11.5–15.5)
WBC: 1.3 10*3/uL — CL (ref 4.0–10.5)
nRBC: 24 /100 WBC — ABNORMAL HIGH
nRBC: 9.9 % — ABNORMAL HIGH (ref 0.0–0.2)

## 2019-06-20 LAB — URINE CULTURE: Culture: NO GROWTH

## 2019-06-20 LAB — BPAM RBC
Blood Product Expiration Date: 202101292359
Unit Type and Rh: 5100

## 2019-06-20 LAB — GLUCOSE, CAPILLARY: Glucose-Capillary: 189 mg/dL — ABNORMAL HIGH (ref 70–99)

## 2019-06-20 LAB — MAGNESIUM: Magnesium: 2.4 mg/dL (ref 1.7–2.4)

## 2019-06-20 LAB — C-REACTIVE PROTEIN: CRP: 35 mg/dL — ABNORMAL HIGH (ref <1.0)

## 2019-06-20 LAB — PREPARE RBC (CROSSMATCH)

## 2019-06-20 LAB — FIBRIN DERIVATIVES D-DIMER (ARMC ONLY): Fibrin derivatives D-dimer (ARMC): 1869.19 ng/mL (FEU) — ABNORMAL HIGH (ref 0.00–499.00)

## 2019-06-20 MED ORDER — ONDANSETRON 4 MG PO TBDP
4.0000 mg | ORAL_TABLET | Freq: Four times a day (QID) | ORAL | Status: DC | PRN
  Filled 2019-06-20: qty 1

## 2019-06-20 MED ORDER — SODIUM CHLORIDE 0.9% FLUSH
3.0000 mL | Freq: Two times a day (BID) | INTRAVENOUS | Status: DC
  Administered 2019-06-20 – 2019-06-22 (×6): 3 mL via INTRAVENOUS

## 2019-06-20 MED ORDER — MORPHINE SULFATE (CONCENTRATE) 10 MG/0.5ML PO SOLN
5.0000 mg | ORAL | Status: DC | PRN
  Filled 2019-06-20: qty 0.5

## 2019-06-20 MED ORDER — GLYCOPYRROLATE 1 MG PO TABS
1.0000 mg | ORAL_TABLET | ORAL | Status: DC | PRN
  Filled 2019-06-20 (×2): qty 1

## 2019-06-20 MED ORDER — ONDANSETRON HCL 4 MG/2ML IJ SOLN: 4.0000 mg | Freq: Four times a day (QID) | INTRAMUSCULAR | Status: DC | PRN

## 2019-06-20 MED ORDER — LORAZEPAM 2 MG/ML PO CONC
1.0000 mg | ORAL | Status: DC | PRN
  Filled 2019-06-20: qty 0.5

## 2019-06-20 MED ORDER — GLYCOPYRROLATE 0.2 MG/ML IJ SOLN
0.2000 mg | INTRAMUSCULAR | Status: DC | PRN
  Filled 2019-06-20 (×2): qty 1

## 2019-06-20 MED ORDER — POLYVINYL ALCOHOL 1.4 % OP SOLN
1.0000 [drp] | Freq: Four times a day (QID) | OPHTHALMIC | Status: DC | PRN
  Filled 2019-06-20: qty 15

## 2019-06-20 MED ORDER — HALOPERIDOL LACTATE 5 MG/ML IJ SOLN
0.5000 mg | INTRAMUSCULAR | Status: DC | PRN
  Filled 2019-06-20: qty 0.1
  Filled 2019-06-20: qty 1

## 2019-06-20 MED ORDER — SODIUM CHLORIDE 0.9% IV SOLUTION
Freq: Once | INTRAVENOUS | Status: DC
  Filled 2019-06-20: qty 250

## 2019-06-20 MED ORDER — HALOPERIDOL 0.5 MG PO TABS
0.5000 mg | ORAL_TABLET | ORAL | Status: DC | PRN
  Filled 2019-06-20 (×2): qty 1

## 2019-06-20 MED ORDER — ACETAMINOPHEN 325 MG PO TABS: 650.0000 mg | ORAL_TABLET | Freq: Four times a day (QID) | ORAL | Status: DC | PRN

## 2019-06-20 MED ORDER — MORPHINE SULFATE (PF) 2 MG/ML IV SOLN
1.0000 mg | INTRAVENOUS | Status: DC | PRN
  Administered 2019-06-20 – 2019-06-21 (×9): 2 mg via INTRAVENOUS
  Administered 2019-06-22: 1 mg via INTRAVENOUS
  Administered 2019-06-23: 2 mg via INTRAVENOUS
  Filled 2019-06-20 (×12): qty 1

## 2019-06-20 MED ORDER — SODIUM CHLORIDE 0.9% FLUSH: 3.0000 mL | INTRAVENOUS | Status: DC | PRN

## 2019-06-20 MED ORDER — BIOTENE DRY MOUTH MT LIQD
15.0000 mL | OROMUCOSAL | Status: DC | PRN
  Filled 2019-06-20 (×2): qty 15

## 2019-06-20 MED ORDER — HALOPERIDOL LACTATE 2 MG/ML PO CONC
0.5000 mg | ORAL | Status: DC | PRN
  Filled 2019-06-20 (×2): qty 0.3

## 2019-06-20 MED ORDER — LORAZEPAM 2 MG/ML IJ SOLN
1.0000 mg | INTRAMUSCULAR | Status: DC | PRN
  Administered 2019-06-20 – 2019-06-23 (×4): 1 mg via INTRAVENOUS
  Filled 2019-06-20 (×4): qty 1

## 2019-06-20 MED ORDER — PANTOPRAZOLE SODIUM 40 MG IV SOLR
40.0000 mg | Freq: Two times a day (BID) | INTRAVENOUS | Status: DC
Start: 2019-06-20 — End: 2019-06-23
  Administered 2019-06-20 – 2019-06-22 (×5): 40 mg via INTRAVENOUS
  Filled 2019-06-20 (×7): qty 40

## 2019-06-20 MED ORDER — LORAZEPAM 1 MG PO TABS: 1.0000 mg | ORAL_TABLET | ORAL | Status: DC | PRN

## 2019-06-20 MED ORDER — ACETAMINOPHEN 650 MG RE SUPP: 650.0000 mg | Freq: Four times a day (QID) | RECTAL | Status: DC | PRN

## 2019-06-20 NOTE — ED Notes (Signed)
Pt in bed playing with BP cuff.

## 2019-06-20 NOTE — Progress Notes (Signed)
Patient has now pulled out 3 IVs, will reestablish IV access and place mitts on patient per Dr. Posey Pronto. Lab called for type and screen.

## 2019-06-20 NOTE — Progress Notes (Signed)
PHARMACY - PHYSICIAN COMMUNICATION CRITICAL VALUE ALERT - BLOOD CULTURE IDENTIFICATION (BCID)  Mike [name] is an 74 y.o. male who presented to [facility] on 06/11/2019 with a chief complaint of hypoglycemia, covid + from SNF  Assessment:  Blood cx 1 bottle of 4 (aerobic) is positive for GPC.  ?potential contaminant.  Covid +  Name of physician (or Provider) Contacted: Florina Ou  Current antibiotics: CTX/Azith  Changes to prescribed antibiotics recommended:  Message to MD has been seen. No changes to therapy noted   Andreya Lacks A 06/20/2019  8:21 AM

## 2019-06-20 NOTE — Progress Notes (Signed)
Patient found this morning at approximately 0740 with IV pulled out and blood smeared on torso and bilateral upper extremities. Patient bathed. Patient disoriented x4 at this time.

## 2019-06-20 NOTE — Progress Notes (Signed)
Patient transitioned to comfort measures by NP Laurann Montana. Blood orders DC and blood bank notified by Probation officer.

## 2019-06-20 NOTE — ED Notes (Signed)
Informed MD of increased HR and palpated irregular pulse.

## 2019-06-20 NOTE — ED Notes (Signed)
Attempted to call reprt

## 2019-06-20 NOTE — Consult Note (Addendum)
Consultation Note Date: 06/20/2019   Patient Name: Mike [name]  DOB: [birth year]  MRN: [id number]  Age / Sex: 74 y.o., male  PCP: Adin Hector, MD Referring Physician: Para Skeans, MD  Reason for Consultation: Establishing goals of care  HPI/Patient Profile: Mike [name] is a 74 y.o. male  malewith a history of AML, diabetes, hypertension, left upper extremity DVT, GERD, epilepsywho was sent to the ED from the [place] nursing facility due to low blood sugar which was found to be 50.  PerEMS report, on their arrival, it was 70 something. Patient was noted to have an oxygen saturation of about 85% on room air.EMS transferred patient on nonrebreather.  Patient was known Covid positive at the nursing facility.  He was also receiving hospice care.  Clinical Assessment and Goals of Care: Patient is resting in bed. He is confused and looking at the ceiling saying "momma" "where's momma?" Spoke with his sister Mike [name]. She states Mike [name] was under hospice care at his facility, and was supposed to be discharging today to [facility] for longterm care, however he developed Covid and could no longer go to [facility]. She states they did not have a discharge plan.   She states she is his HPOA. She states until recently he was able to talk on the phone, but has declined rapidly. She states he was given a prognosis of 6 months and chose comfort and hospice care, as any care through oncology would be palliative in nature.   She states she was called yesterday and asked if she wanted him brought to the hospital and did not know what to say as he had nowhere else to go. She states she has been kept updated by ED staff. She understands his glucose level, anemia, plans for blood transfusion, and need for continued lab work. She understands his poor nutrition and overall poor status. She  understands he has removed multiple  IV's and now has mittens in place. She states she does not want him to suffer, and does not want to prolong a quality of life he is not happy with, or prolong suffering. She does not want to continue any life prolonging interventions and wants to focus on his comfort for what time he has left. Mike [name] is aware he could die over the weekend, or be too unstable to transfer out of the hospital.   Mike [name] states she currently has Covid herself, and will not be able to visit, but will check with their brother and other family members to see if someone would want to come to visit.  I completed a MOST form today through [facility] with sister Mike [name]. She outlined their wishes for the following treatment decisions:  Cardiopulmonary Resuscitation: Do Not Attempt Resuscitation (DNR/No CPR)  Medical Interventions: Comfort Measures: Keep clean, warm, and dry. Use medication by any route, positioning, wound care, and other measures to relieve pain and suffering. Use oxygen, suction and manual treatment of airway obstruction as needed for comfort. Do not transfer to  the hospital unless comfort needs cannot be met in current location.  Antibiotics: No antibiotics (use other measures to relieve symptoms)  IV Fluids: No IV fluids (provide other measures to ensure comfort)  Feeding Tube: No feeding tube    SUMMARY OF RECOMMENDATIONS   Full comfort care. He could die over the weekend while still admitted.  If leaving the hospital, will need placement in a facility with hospice care, or a hospice facility that will be able to accept him being covid +.    Comfort care orders placed per end of life order set. Please titrate/make adjustments to medications as needed for Mike [name] comfort.  Prognosis:   Hours - Days likely.       Primary Diagnoses: Present on Admission: . Acute respiratory failure due to COVID-19 [facility])   I have reviewed the medical record, interviewed the patient and family, and examined the  patient. The following aspects are pertinent.  Past Medical History:  Diagnosis Date  . AML (acute myeloblastic leukemia) ([place])   . Anemia   . Anxiety   . Atherosclerotic heart disease   . Benign prostatic hyperplasia   . Depression   . Diabetes mellitus without complication ([place])   . DVT of upper extremity (deep vein thrombosis) ([facility])    left arm  . Epilepsy ([place])   . Epilepsy ([place])   . GERD (gastroesophageal reflux disease)   . High cholesterol   . Hypertension   . Prostate cancer [facility]) 2004   prostate removed   Social History   Socioeconomic History  . Marital status: Single    Spouse name: Not on file  . Number of children: Not on file  . Years of education: Not on file  . Highest education level: Not on file  Occupational History  . Not on file  Tobacco Use  . Smoking status: Never Smoker  . Smokeless tobacco: Never Used  Substance and Sexual Activity  . Alcohol use: No    Comment: not since he was in hospital and nursing facilities  . Drug use: No  . Sexual activity: Not Currently  Other Topics Concern  . Not on file  Social History Narrative  . Not on file   Social Determinants of Health   Financial Resource Strain:   . Difficulty of Paying Living Expenses: Not on file  Food Insecurity:   . Worried About Charity fundraiser in the Last Year: Not on file  . Ran Out of Food in the Last Year: Not on file  Transportation Needs:   . Lack of Transportation (Medical): Not on file  . Lack of Transportation (Non-Medical): Not on file  Physical Activity:   . Days of Exercise per Week: Not on file  . Minutes of Exercise per Session: Not on file  Stress:   . Feeling of Stress : Not on file  Social Connections:   . Frequency of Communication with Friends and Family: Not on file  . Frequency of Social Gatherings with Friends and Family: Not on file  . Attends Religious Services: Not on file  . Active Member of Clubs or Organizations: Not on file  . Attends  Archivist Meetings: Not on file  . Marital Status: Not on file   History reviewed. No pertinent family history. Scheduled Meds: . sodium chloride   Intravenous Once  . dexamethasone (DECADRON) injection  6 mg Intravenous Q24H  . ipratropium  2 puff Inhalation Q6H  . pantoprazole (PROTONIX) IV  40 mg  Intravenous Q12H  . sodium chloride flush  3 mL Intravenous Q12H  . tamsulosin  0.4 mg Oral Daily   Continuous Infusions: PRN Meds:.acetaminophen **OR** acetaminophen, acetaminophen, antiseptic oral rinse, chlorpheniramine-HYDROcodone, glycopyrrolate **OR** glycopyrrolate **OR** glycopyrrolate, guaiFENesin-dextromethorphan, haloperidol **OR** haloperidol **OR** haloperidol lactate, LORazepam **OR** LORazepam **OR** LORazepam, morphine injection, morphine CONCENTRATE **OR** morphine CONCENTRATE, ondansetron **OR** ondansetron (ZOFRAN) IV, polyvinyl alcohol, sodium chloride flush Medications Prior to Admission:  Prior to Admission medications   Medication Sig Start Date End Date Taking? Authorizing Provider  acetaminophen (TYLENOL) 325 MG tablet Take 650 mg by mouth every 6 (six) hours as needed.    [provider]  acetaminophen-codeine (TYLENOL #3) 300-30 MG tablet Take 1 tablet by mouth every 8 (eight) hours as needed for moderate pain. 02/14/19   Gregor Hams, MD  ALPRAZolam Duanne Moron) 0.5 MG tablet Take 0.5 mg by mouth 2 (two) times daily.  12/25/18   [provider]  apixaban (ELIQUIS) 5 MG TABS tablet Take 2 tablets (24m) by mouth twice a day for 6 more days, then take 1 tablet (531m by mouth twice a day 04/09/19   Mayo, KaPete PeltMD  aspirin EC 81 MG tablet Take 81 mg by mouth daily.    [provider]  atorvastatin (LIPITOR) 20 MG tablet Take 20 mg by mouth daily.    [provider]  Cholecalciferol (VITAMIN D-1000 MAX ST) 25 MCG (1000 UT) tablet Take 1,000 Units by mouth daily. 08/30/12   [provider]  clonazePAM (KLONOPIN) 0.5 MG  tablet Take 1 tablet (0.5 mg total) by mouth 2 (two) times daily. 04/13/19   LoPatrecia PourNP  Cyanocobalamin (VITAMIN B-12 CR) 1000 MCG TBCR Take 2,000 mcg by mouth daily.  12/27/10   [provider]  Dimethicone-Zinc Oxide-Vit A-D (A & D ZINC OXIDE) CREA Apply topically 2 (two) times daily.    [provider]  docusate sodium (COLACE) 100 MG capsule Take 200 mg by mouth at bedtime.    [provider]  ferrous sulfate 325 (65 FE) MG tablet Take 1 tablet (325 mg total) by mouth 2 (two) times daily with a meal. 04/13/19   Lord, JaAsa SaunasNP  furosemide (LASIX) 40 MG tablet Take 40 mg by mouth daily as needed for edema. 02/19/19   [provider]  gabapentin (NEURONTIN) 300 MG capsule Take 1,200 mg by mouth at bedtime.    [provider]  glimepiride (AMARYL) 2 MG tablet Take 2-4 mg by mouth 2 (two) times daily. 4 mg in the morning and 2 mg in the evening 07/19/18   [provider]  hydrOXYzine (ATARAX/VISTARIL) 25 MG tablet Take 25-50 mg by mouth 2 (two) times daily. 25 mg in the morning and 50 mg at bedtime 01/05/15   [provider]  insulin aspart (NOVOLOG) 100 UNIT/ML injection Inject 2-15 Units into the skin 3 (three) times daily with meals. Per sliding scale 121-150= 2 units 151-200= 3 units 201-250= 5 units 251-300= 8 units 301-350= 11 units 351-400= 15 units    [provider]  insulin glargine (LANTUS) 100 UNIT/ML injection Inject 5 Units into the skin daily.    [provider]  lacosamide 100 MG TABS Take 1 tablet (100 mg total) by mouth 2 (two) times daily. 04/09/19   Mayo, KaPete PeltMD  lidocaine (XYLOCAINE) 2 % jelly Apply 1 application topically every 6 (six) hours as needed (penis pain).    [provider]  naphazoline-glycerin (CLEAR EYES REDNESS) 0.012-0.2 %  SOLN Place 2 drops into both eyes daily. 04/09/19   Mayo, Pete Pelt, MD  nitroGLYCERIN (NITROSTAT) 0.4 MG SL tablet Take 0.4 mg by mouth  every 5 (five) minutes as needed for chest pain.  07/24/14   [provider]  nystatin (MYCOSTATIN) 100000 UNIT/ML suspension Take 5 mLs by mouth 4 (four) times daily.    [provider]  Omega-3 1000 MG CAPS Take 1,000 mg by mouth daily. 12/23/10   [provider]  omeprazole (PRILOSEC) 20 MG capsule Take 20 mg by mouth daily.  12/19/14   [provider]  oxyCODONE (OXY IR/ROXICODONE) 5 MG immediate release tablet Take 1 tablet (5 mg total) by mouth every 6 (six) hours as needed for severe pain. 05/29/19   Sindy Guadeloupe, MD  PARoxetine (PAXIL) 20 MG tablet Take 1 tablet (20 mg total) by mouth daily. 06/18/18   Gladstone Lighter, MD  pioglitazone (ACTOS) 30 MG tablet Take 30 mg by mouth daily. 01/23/19   [provider]  polyethylene glycol (MIRALAX / GLYCOLAX) 17 g packet Take 17 g by mouth daily as needed for moderate constipation. 04/09/19   Mayo, Pete Pelt, MD  potassium chloride (KLOR-CON) 10 MEQ tablet Take 10 mEq by mouth daily as needed (when taking furosemide).  02/19/19   [provider]  sennosides-docusate sodium (SENOKOT-S) 8.6-50 MG tablet Take 1 tablet by mouth 2 (two) times daily.    [provider]  tamsulosin (FLOMAX) 0.4 MG CAPS capsule Take 1 capsule (0.4 mg total) by mouth daily. 04/10/19   Mayo, Pete Pelt, MD   Allergies  Allergen Reactions  . Naproxen Sodium Other (See Comments)    Reaction: Upset stomach  . Nsaids Other (See Comments)    Other reaction(s): Unknown  . Aleve [Naproxen] Nausea And Vomiting  . Ibuprofen Nausea Only    Reaction: Upset stomach    Review of Systems  Unable to perform ROS   Physical Exam Constitutional:      Comments: Eyes open.   Pulmonary:     Effort: Pulmonary effort is normal.     Vital Signs: BP (!) 122/37 (BP Location: Left Arm)   Pulse 72   Temp 98 F (36.7 C) (Axillary)   Resp 18   Ht _0  (1.778 m)   Wt 104.3 kg   SpO2 98%   BMI 33.00 kg/m  Pain Scale:  PAINAD       SpO2: SpO2: 98 % O2 Device:SpO2: 98 % O2 Flow Rate: .O2 Flow Rate (L/min): 2 L/min  IO: Intake/output summary:   Intake/Output Summary (Last 24 hours) at 06/20/2019 1347 Last data filed at 06/13/2019 1832 Gross per 24 hour  Intake 1635.88 ml  Output --  Net 1635.88 ml    LBM:   Baseline Weight: Weight: 104.3 kg Most recent weight: Weight: 104.3 kg     Palliative Assessment/Data:     Time In: 12:15 Time Out: 1:30 Time Total:75 min Greater than 50%  of this time was spent counseling and coordinating care related to the above assessment and plan.  Signed by: Asencion Gowda, NP   Please contact Palliative Medicine Team phone at [phone] for questions and concerns.  For individual provider: See Shea Evans

## 2019-06-20 NOTE — ED Notes (Signed)
RN aware of bed assigned 

## 2019-06-20 NOTE — Progress Notes (Signed)
[facility] PROGRESS NOTE  Mike [name] [id number] DOB: 02/22/1946 DOA: 05/29/2019 PCP: Adin Hector, MD  Assessment/Plan: Covid-19 infection with suspected superior imposed bacterial infection/pneumonia and acute respiratory failure suspected sepsis sepsis:  She was found to be tachycardic, desaturating possible source of infection from respiratory source with lactic acid being elevated to above 2 -Per EMS record patient's O2 saturation was found to be in mid 80s on room air she was placed on nonrebreather  -Culture - improved in the ED as patient was successfully taken off of nonrebreather and currently on 2 L/min supplemental oxygen via nasal cannula -Patient has been given Solu-Medrol in the ER; will continue dexamethasone for total of 10 days -Remdesivir per pharmacy protocol -We will continue empiric antibiotic -Vitamins -Check inflammatory markers -Patient was recently diagnosed with upper extremity DVT and has been taking Eliquis we will start home dose per pharmacy -May check a CT angio of chest to rule out any PE . Later this afternoon pt was made comfort care.   Hypoglycemia: Per record patient was found to be hypoglycemic in 58s at the facility and when EMS came it was found to be 32s.  -Hold patient's home diabetic medications and any insulin at this time -May start sliding scale insulin when blood glucose stabilized  Diabetes mellitus type 2: We will hold patient's home needs -Blood work in the ED showing blood glucose 53 -We will continue to hold any insulin for now   AKI: Patient's creatinine was high normal normal on 10 December -Today found to be 1.14 -Likely from dehydration -Continue to monitor -Encourage p.o. 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 Family Communication: none at bedside (indicate person spoken with, relationship, and if by phone, the number) Disposition Plan: Hospice facility.    Consultants:  Hospice    HPI/Subjective: Mike [name] is a 74 y.o. male  malewith a history of AML, diabetes, hypertension, left upper extremity DVT, GERD, epilepsywho was sent to the ED from the [place] facility due to low blood sugar which was found to be 50.  PerEMS report, that on their arrival it was 70 something. Patient was noted to have an oxygen saturation of about 85% on room air. He is known to be Covid positive recently. EMS transferred patient on nonrebreather.  Patient was known Covid positive at the nursing facility.  He was also receiving hospice care.  His COVID-19 antigen tested positive.  And has been given Solu-Medrol in the ER and remdesivir has been started.  Patient was also given empiric antibiotic.  Hospitalist service has been called for further management.  Today received note from pharmacy about eliquis and anemia and prbc was ordered whoever pt is in hospice and transfusion was discontinued. Pt was not to be hospitalized per family. We will d/c to hospice facility tomorrow if able to find space. Pt was made comfort care per family wishes by hospice CM. Pt is confused and restless and awake and cooperative.   Objective: Vitals:   06/20/19 0759 06/20/19 1540  BP: (!) 122/37 (!) 130/99  Pulse: 72 (!) 128  Resp: 18 (!) 26  Temp: 98 F (36.7 C)   SpO2: 98% 95%    Intake/Output Summary (Last 24 hours)  at 06/20/2019 1717 Last data filed at 06/11/2019 1832 Gross per 24 hour  Intake 1635.88 ml  Output --  Net 1635.88 ml   Filed Weights   06/09/2019 1531  Weight: 104.3 kg    Exam:  Constitutional: Alert but not oriented, appears to be ill.  On supplemental oxygen via nasal cannula Eyes: Conjunctivae are normal. EOMI. PERRL. ENT Head: Normocephalic  and atraumatic. Nose: No congestion/rhinnorhea.  Mouth/Throat:Dry mucous membranes, no pharyngeal erythema. No peritonsillar mass.  Neck: No meningismus. Full ROM. Cardiovascular: Tachycardic with regular rhythm.  No murmur or gallops appreciated.   Respiratory:Tachypnea;  wheezing. Bilateral basilar crackles. Gastrointestinal: Soft and nontender. Non distended. There is no CVA tenderness. No rebound, rigidity, or guarding. Musculoskeletal: Normal range of motion in all extremities. No joint effusions. No lower extremity tenderness. No edema. Neurologic: Mumbling incoherent speech, repetitive phrases..  Motor grossly intact. Skin: Skin is warm, dry and intact. No rash noted. No petechiae, purpura, or bullae.  Data Reviewed: Basic Metabolic Panel: Recent Labs  Lab 06/14/2019 1603 06/20/19 0512  NA 139 139  K 4.0 4.1  CL 100 102  CO2 29 25  GLUCOSE 53* 165*  BUN 16 24*  CREATININE 1.14 1.26*  CALCIUM 8.6* 8.0*  MG  --  2.4   Liver Function Tests: Recent Labs  Lab 06/04/2019 1603 06/20/19 0512  AST 53* 35  ALT 23 21  ALKPHOS 88 68  BILITOT 2.7* 2.1*  PROT 7.1 5.9*  ALBUMIN 3.4* 2.6*   Recent Labs  Lab 06/11/2019 1603  LIPASE 23   Recent Labs  Lab 06/17/2019 1602  AMMONIA 13   CBC: Recent Labs  Lab 05/21/2019 1603 06/20/19 0512  WBC 1.4* 1.3*  NEUTROABS 0.5* 0.4*  HGB 6.9* 5.9*  HCT 23.8* 21.0*  MCV 81.2 82.0  PLT 86* 62*   Cardiac Enzymes: No results for input(s): CKTOTAL, CKMB, CKMBINDEX, TROPONINI in the last 168 hours. BNP (last 3 results) Recent Labs    03/26/19 2012  BNP 125.0*    ProBNP (last 3 results) No results for input(s): PROBNP in the last 8760 hours.  CBG: Recent Labs  Lab 06/20/19 0821  GLUCAP 189*    Recent Results (from the past 240 hour(s))  Blood Culture (routine x 2)     Status: None (Preliminary result)   Collection Time: 05/30/2019  4:02 PM   Specimen: BLOOD  Result Value Ref Range  Status   Specimen Description BLOOD RIGHT ANTECUBITAL  Final   Special Requests   Final    BOTTLES DRAWN AEROBIC AND ANAEROBIC Blood Culture adequate volume   Culture   Final    NO GROWTH < 24 HOURS Performed at [facility], [address] [place], [place] [address]    Report Status PENDING  Incomplete  Blood Culture (routine x 2)     Status: None (Preliminary result)   Collection Time: 05/26/2019  4:03 PM   Specimen: BLOOD  Result Value Ref Range Status   Specimen Description BLOOD LEFT ANTECUBITAL  Final   Special Requests   Final    BOTTLES DRAWN AEROBIC AND ANAEROBIC Blood Culture adequate volume   Culture  Setup Time   Final    GRAM POSITIVE COCCI IN BOTH AEROBIC AND ANAEROBIC BOTTLES CRITICAL RESULT CALLED TO, READ BACK BY AND VERIFIED WITH: Tobie Lords [id number] 06/20/19 HNM Performed at [facility], [address], [place], [place] [address]    Culture [facility] POSITIVE COCCI  Final   Report Status PENDING  Incomplete  Studies: CT Angio Chest PE W and/or Wo Contrast  Result Date: 05/24/2019 CLINICAL DATA:  Hypoxia, COVID-19 positive EXAM: CT ANGIOGRAPHY CHEST WITH CONTRAST TECHNIQUE: Multidetector CT imaging of the chest was performed using the standard protocol during bolus administration of intravenous contrast. Multiplanar CT image reconstructions and MIPs were obtained to evaluate the vascular anatomy. CONTRAST:  87mL OMNIPAQUE IOHEXOL 350 MG/ML SOLN COMPARISON:  X-ray 06/05/2019 FINDINGS: Cardiovascular: Satisfactory opacification of the pulmonary arteries to the segmental level. No evidence of pulmonary embolism. Normal heart size. Trace pericardial effusion. Thoracic aorta is nonaneurysmal. Post CABG changes. Mediastinum/Nodes: No enlarged mediastinal, hilar, or axillary lymph nodes. Thyroid gland, trachea, and esophagus demonstrate no significant findings. Lungs/Pleura: Moderate bilateral pleural effusions with associated compressive atelectasis.  There are a few patchy areas of ground-glass opacity, notably at the right lung apex (series 6, image 16). No pneumothorax. Upper Abdomen: Reflux of contrast into the hepatic veins suggesting right heart dysfunction. No acute findings within the visualized portion of the upper abdomen. Musculoskeletal: No chest wall abnormality. No acute or significant osseous findings. Review of the MIP images confirms the above findings. IMPRESSION: 1. No evidence for pulmonary embolus. 2. Moderate bilateral pleural effusions with associated compressive atelectasis. 3. A few patchy areas of ground-glass opacity, notably at the right lung apex, suggesting an infectious or inflammatory process. 4. Trace pericardial effusion. 5. Reflux of contrast into the hepatic veins suggesting right heart dysfunction. Electronically Signed   By: Davina Poke D.O.   On: 06/01/2019 18:33   DG Chest Port 1 View  Result Date: 06/11/2019 CLINICAL DATA:  COVID positive, dyspnea, AML EXAM: PORTABLE CHEST 1 VIEW COMPARISON:  04/05/2019 FINDINGS: Increased retrocardiac density. Small left pleural effusion. Cardiomediastinal contours are likely within normal limits for portable technique. There is evidence of prior cardiac surgery. IMPRESSION: Small left pleural effusion. Retrocardiac atelectasis/consolidation. Electronically Signed   By: Macy Mis M.D.   On: 05/23/2019 16:30    Scheduled Meds: . dexamethasone (DECADRON) injection  6 mg Intravenous Q24H  . ipratropium  2 puff Inhalation Q6H  . pantoprazole (PROTONIX) IV  40 mg Intravenous Q12H  . sodium chloride flush  3 mL Intravenous Q12H  . tamsulosin  0.4 mg Oral Daily     Time spent: 20 minutes   [place] [facility] If 7PM-7AM, please contact night-coverage at [email or web address], password [facility] 06/20/2019, 5:17 PM  LOS: 1 day

## 2019-06-20 DEATH — deceased

## 2019-06-21 MED ORDER — MORPHINE SULFATE (PF) 2 MG/ML IV SOLN
1.0000 mg | Freq: Three times a day (TID) | INTRAVENOUS | Status: AC | PRN
  Administered 2019-06-22: 1 mg via INTRAVENOUS
  Filled 2019-06-21: qty 1

## 2019-06-21 NOTE — Progress Notes (Signed)
[facility] PROGRESS NOTE  [name] [id number] DOB: Jan 02, 1946 DOA: 05/25/2019 PCP: Mike Hector, MD  Assessment/Plan: Covid-19 infection with suspected superior imposed bacterial infection/pneumonia and acute respiratory failure suspected sepsis sepsis:  She was found to be tachycardic, desaturating possible source of infection from respiratory source with lactic acid being elevated to above 2 -Per EMS record patient's O2 saturation was found to be in mid 80s on room air she was placed on nonrebreather  - improved in the ED as patient was successfully taken off of nonrebreather and currently on 2 L/min supplemental oxygen via nasal cannula -Patient has been given Solu-Medrol in the ER; will continue dexamethasone for total of 10 days -Remdesivir per pharmacy protocol -We will continue empiric antibiotic -Vitamins -Check inflammatory markers -Patient was recently diagnosed with upper extremity DVT and has been taking Eliquis we will start home dose per pharmacy -May check a CT angio of chest to rule out any PE . Later this afternoon pt was made comfort care.   Hypoglycemia: Per record patient was found to be hypoglycemic in 63s at the facility and when EMS came it was found to be 8s.  -Hold patient's home diabetic medications and any insulin at this time -May start sliding scale insulin when blood glucose stabilized  Diabetes mellitus type 2: We will hold patient's home needs -Blood work in the ED showing blood glucose 53 -We will continue to hold any insulin for now   AKI: Patient's creatinine was high normal normal on 10 December -Today found to be 1.14 -Likely from dehydration -Continue to monitor -Encourage p.o. 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 Family Communication: none at bedside (indicate person spoken with, relationship, and if by phone, the number) Disposition Plan: Hospice facility.    Consultants:  Hospice    HPI/Subjective: [name] is a 74 y.o. male  malewith a history of AML, diabetes, hypertension, left upper extremity DVT, GERD, epilepsywho was sent to the ED from the [place] facility due to low blood sugar which was found to be 50.  PerEMS report, that on their arrival it was 70 something. Patient was noted to have an oxygen saturation of about 85% on room air. He is known to be Covid positive recently. EMS transferred patient on nonrebreather.  Patient was known Covid positive at the nursing facility.  He was also receiving hospice care.  His COVID-19 antigen tested positive.  And has been given Solu-Medrol in the ER and remdesivir has been started.  Patient was also given empiric antibiotic.  Hospitalist service has been called for further management.  Today received note from pharmacy about eliquis and anemia and prbc was ordered whoever pt is in hospice and transfusion was discontinued. Pt was not to be hospitalized per family. We will d/c to hospice facility tomorrow if able to find space. Pt was made comfort care per family wishes by hospice CM. Pt is confused and restless and awake and cooperative.    Pt is awake but confused.He is tachycardic and will give morphine for pain and schedule it q8H for  HR above 110 and we will monitor of 24 hours if he is comfortable, consider to continue. Objective: Vitals:   06/20/19 2040 06/21/19  0808  BP: (!) 145/78 121/70  Pulse: (!) 148 (!) 128  Resp: 20 16  Temp: (!) 96.4 F (35.8 C) 97.8 F (36.6 C)  SpO2: 94% 93%    Intake/Output Summary (Last 24 hours) at 06/21/2019 1728 Last data filed at 06/21/2019 0700 Gross per 24 hour  Intake 0 ml  Output 0 ml  Net 0 ml   Filed Weights   05/24/2019 1531 06/20/19 2040  Weight:  104.3 kg 96 kg    Exam:  Constitutional: Alert but not oriented, appears to be ill.  On supplemental oxygen via nasal cannula Eyes: Conjunctivae are normal. EOMI. PERRL. ENT Head: Normocephalic and atraumatic. Nose: No congestion/rhinnorhea.  Mouth/Throat:Dry mucous membranes, no pharyngeal erythema. No peritonsillar mass.  Neck: No meningismus. Full ROM. Cardiovascular: Tachycardic with regular rhythm.  No murmur or gallops appreciated.   Respiratory:Tachypnea;  wheezing. Bilateral basilar crackles. Gastrointestinal: Soft and nontender. Non distended. There is no CVA tenderness. No rebound, rigidity, or guarding. Musculoskeletal: Deferred Neurologic: Mumbling incoherent speech, repetitive phrases..  CN grossly intact  Skin: Skin is warm, dry and intact. No rash noted. No petechiae, purpura, or bullae.  Data Reviewed: Basic Metabolic Panel: Recent Labs  Lab 05/31/2019 1603 06/20/19 0512  NA 139 139  K 4.0 4.1  CL 100 102  CO2 29 25  GLUCOSE 53* 165*  BUN 16 24*  CREATININE 1.14 1.26*  CALCIUM 8.6* 8.0*  MG  --  2.4   Liver Function Tests: Recent Labs  Lab 05/26/2019 1603 06/20/19 0512  AST 53* 35  ALT 23 21  ALKPHOS 88 68  BILITOT 2.7* 2.1*  PROT 7.1 5.9*  ALBUMIN 3.4* 2.6*   Recent Labs  Lab 05/26/2019 1603  LIPASE 23   Recent Labs  Lab 05/31/2019 1602  AMMONIA 13   CBC: Recent Labs  Lab 06/17/2019 1603 06/20/19 0512  WBC 1.4* 1.3*  NEUTROABS 0.5* 0.4*  HGB 6.9* 5.9*  HCT 23.8* 21.0*  MCV 81.2 82.0  PLT 86* 62*   Cardiac Enzymes: No results for input(s): CKTOTAL, CKMB, CKMBINDEX, TROPONINI in the last 168 hours. BNP (last 3 results) Recent Labs    03/26/19 2012  BNP 125.0*    ProBNP (last 3 results) No results for input(s): PROBNP in the last 8760 hours.  CBG: Recent Labs  Lab 06/20/19 0821  GLUCAP 189*    Recent Results (from the past 240 hour(s))  Blood Culture (routine x 2)      Status: None (Preliminary result)   Collection Time: 05/29/2019  4:02 PM   Specimen: BLOOD  Result Value Ref Range Status   Specimen Description BLOOD RIGHT ANTECUBITAL  Final   Special Requests   Final    BOTTLES DRAWN AEROBIC AND ANAEROBIC Blood Culture adequate volume   Culture   Final    NO GROWTH 2 DAYS Performed at [name], [address] [place], [place] [address]    Report Status PENDING  Incomplete  Urine culture     Status: None   Collection Time: 05/23/2019  4:02 PM   Specimen: In/Out Cath Urine  Result Value Ref Range Status   Specimen Description   Final    IN/OUT CATH URINE Performed at [name], [address] [place], [place] [address]    Special Requests   Final    NONE Performed at [facility], [address], [place], [place] [address]    Culture   Final    NO GROWTH Performed at [facility],  [address], [place], [place] [address]    Report Status 06/20/2019 FINAL  Final  Blood Culture (routine x 2)     Status: Abnormal (Preliminary result)   Collection Time: 05/20/2019  4:03 PM   Specimen: BLOOD  Result Value Ref Range Status   Specimen Description   Final    BLOOD LEFT ANTECUBITAL Performed at [facility], [address], [place], [place] [address]    Special Requests   Final    BOTTLES DRAWN AEROBIC AND ANAEROBIC Blood Culture adequate volume Performed at [facility], [address] [place], [place] [address]    Culture  Setup Time   Final    GRAM POSITIVE COCCI IN BOTH AEROBIC AND ANAEROBIC BOTTLES CRITICAL RESULT CALLED TO, READ BACK BY AND VERIFIED WITH: Tobie Lords [id number] 06/20/19 HNM Performed at [name], [address] [place], [place] [address]    Culture (A)  Final    STAPHYLOCOCCUS SPECIES (COAGULASE NEGATIVE) THE SIGNIFICANCE OF ISOLATING THIS ORGANISM FROM A SINGLE SET OF BLOOD CULTURES WHEN MULTIPLE SETS ARE DRAWN IS UNCERTAIN. PLEASE NOTIFY THE  MICROBIOLOGY DEPARTMENT WITHIN ONE WEEK IF SPECIATION AND SENSITIVITIES ARE REQUIRED. Performed at [name], [place] [address], [place], [place] [address]    Report Status PENDING  Incomplete     Studies: CT Angio Chest PE W and/or Wo Contrast  Result Date: 06/14/2019 CLINICAL DATA:  Hypoxia, COVID-19 positive EXAM: CT ANGIOGRAPHY CHEST WITH CONTRAST TECHNIQUE: Multidetector CT imaging of the chest was performed using the standard protocol during bolus administration of intravenous contrast. Multiplanar CT image reconstructions and MIPs were obtained to evaluate the vascular anatomy. CONTRAST:  21mL OMNIPAQUE IOHEXOL 350 MG/ML SOLN COMPARISON:  X-ray 05/22/2019 FINDINGS: Cardiovascular: Satisfactory opacification of the pulmonary arteries to the segmental level. No evidence of pulmonary embolism. Normal heart size. Trace pericardial effusion. Thoracic aorta is nonaneurysmal. Post CABG changes. Mediastinum/Nodes: No enlarged mediastinal, hilar, or axillary lymph nodes. Thyroid gland, trachea, and esophagus demonstrate no significant findings. Lungs/Pleura: Moderate bilateral pleural effusions with associated compressive atelectasis. There are a few patchy areas of ground-glass opacity, notably at the right lung apex (series 6, image 16). No pneumothorax. Upper Abdomen: Reflux of contrast into the hepatic veins suggesting right heart dysfunction. No acute findings within the visualized portion of the upper abdomen. Musculoskeletal: No chest wall abnormality. No acute or significant osseous findings. Review of the MIP images confirms the above findings. IMPRESSION: 1. No evidence for pulmonary embolus. 2. Moderate bilateral pleural effusions with associated compressive atelectasis. 3. A few patchy areas of ground-glass opacity, notably at the right lung apex, suggesting an infectious or inflammatory process. 4. Trace pericardial effusion. 5. Reflux of contrast into the hepatic veins suggesting right  heart dysfunction. Electronically Signed   By: Davina Poke D.O.   On: 06/05/2019 18:33    Scheduled Meds: . dexamethasone (DECADRON) injection  6 mg Intravenous Q24H  . ipratropium  2 puff Inhalation Q6H  . pantoprazole (PROTONIX) IV  40 mg Intravenous Q12H  . sodium chloride flush  3 mL Intravenous Q12H  . tamsulosin  0.4 mg Oral Daily     Time spent: 20 minutes   [facility] If 7PM-7AM, please contact night-coverage at [email or web address], password [name] 06/21/2019, 5:28 PM  LOS: 2 days

## 2019-06-22 LAB — CULTURE, BLOOD (ROUTINE X 2): Special Requests: ADEQUATE

## 2019-06-22 NOTE — Progress Notes (Signed)
PROGRESS NOTE    [name]  [id number] DOB: 18-Dec-1945 DOA: 06/05/2019  PCP: Adin Hector, MD    LOS - 3   Brief Narrative:  Mike Lade Storeyis a 74 y.o.malemalewith a history of AML, diabetes, hypertension, left upper extremity DVT, GERD, epilepsywho was sent to the ED from the [place] facility due to low blood sugar which was found to be 50.PerEMS report,that on their arrival it was 70 something. Patient was noted to have an oxygen saturation of about 85% on room air. He is known to be Covid positive recently. EMS transferred patient on nonrebreather.Patient was known Covid positive at the nursing facility. He was also receiving hospice care. His COVID-19 antigen tested positive, was given Solu-Medrol in the ER and remdesivir started.  Per records, family does not want patient hospitalized and wish for only comfort care measures.  Pending discharge to residential hospice vs back to his prior SNF.   Subjective 1/3: Patient seen this AM, sleeping and without apparent distress.  No acute events reported.  He denies pain or discomfort.  Covered arms with blankets as they felt cool.  Appears comfortable at this time.  Assessment & Plan:   Active Problems:   Acute respiratory failure due to COVID-19 [facility])   Covid-19 infection with suspected superior imposed bacterial infection/pneumonia and acute respiratory failure suspected sepsis sepsis:  She was found to be tachycardic, desaturating possible source of infection from respiratory source with lactic acid being elevated to above 2.  Per EMS record patient's O2 saturation was found to be in mid 80s on room air she was placed on nonrebreather.  Improved in the ED as patient was successfully taken off of nonrebreather and on 2 L/min supplemental oxygen via nasal cannula As of afternoon 1/2, patient was made comfort care only.  Treatment stopped.    COMFORT CARE for all acute and chronic medical conditions. No  Sticks, No Labs  Comfort Care --administer medications per orders to keep patient comfortable. --if any signs of distress or discomfort, please inform MD    DVT prophylaxis: None - comfort care   Code Status: DNR  Family Communication: none at bedside  Disposition Plan:  Hospice if he survives this weekend   Consultants:   Palliative care  Hospice  Procedures:   None  Antimicrobials:   None    Objective: Vitals:   06/20/19 1920 06/20/19 2040 06/21/19 0808 06/22/19 0403  BP: 96/83 (!) 145/78 121/70 124/80  Pulse: (!) 125 (!) 148 (!) 128 80  Resp:  20 16 19   Temp:  (!) 96.4 F (35.8 C) 97.8 F (36.6 C) 97.6 F (36.4 C)  TempSrc:  Axillary Axillary Oral  SpO2: 97% 94% 93% 93%  Weight:  96 kg    Height:       No intake or output data in the 24 hours ending 06/22/19 1337 Filed Weights   06/06/2019 1531 06/20/19 2040  Weight: 104.3 kg 96 kg    Examination:  General exam: awake, alert, no acute distress Respiratory system: upper airway secretion sounds resonate diffusely Cardiovascular system: normal S1/S2, RRR, no pedal edema.   Extremities: cool, dry, normal tone    Data Reviewed: I have personally reviewed following labs and imaging studies  CBC: Recent Labs  Lab 06/14/2019 1603 06/20/19 0512  WBC 1.4* 1.3*  NEUTROABS 0.5* 0.4*  HGB 6.9* 5.9*  HCT 23.8* 21.0*  MCV 81.2 82.0  PLT 86* 62*   Basic Metabolic Panel: Recent Labs  Lab  06/06/2019 1603 06/20/19 0512  NA 139 139  K 4.0 4.1  CL 100 102  CO2 29 25  GLUCOSE 53* 165*  BUN 16 24*  CREATININE 1.14 1.26*  CALCIUM 8.6* 8.0*  MG  --  2.4   GFR: Estimated Creatinine Clearance: 59.8 mL/min (A) (by C-G formula based on SCr of 1.26 mg/dL (H)). Liver Function Tests: Recent Labs  Lab 06/07/2019 1603 06/20/19 0512  AST 53* 35  ALT 23 21  ALKPHOS 88 68  BILITOT 2.7* 2.1*  PROT 7.1 5.9*  ALBUMIN 3.4* 2.6*   Recent Labs  Lab 06/18/2019 1603  LIPASE 23   Recent Labs  Lab 06/09/2019 1602   AMMONIA 13   Coagulation Profile: Recent Labs  Lab 06/10/2019 1603  INR 1.7*   Cardiac Enzymes: No results for input(s): CKTOTAL, CKMB, CKMBINDEX, TROPONINI in the last 168 hours. BNP (last 3 results) No results for input(s): PROBNP in the last 8760 hours. HbA1C: No results for input(s): HGBA1C in the last 72 hours. CBG: Recent Labs  Lab 06/20/19 0821  GLUCAP 189*   Lipid Profile: No results for input(s): CHOL, HDL, LDLCALC, TRIG, CHOLHDL, LDLDIRECT in the last 72 hours. Thyroid Function Tests: No results for input(s): TSH, T4TOTAL, FREET4, T3FREE, THYROIDAB in the last 72 hours. Anemia Panel: Recent Labs    06/11/2019 1603 06/20/19 0512  FERRITIN 1,590* 1,511*   Sepsis Labs: Recent Labs  Lab 06/13/2019 1603 06/03/2019 1908  PROCALCITON 2.51  --   LATICACIDVEN 2.3* 1.7    Recent Results (from the past 240 hour(s))  Blood Culture (routine x 2)     Status: None (Preliminary result)   Collection Time: 06/13/2019  4:02 PM   Specimen: BLOOD  Result Value Ref Range Status   Specimen Description BLOOD RIGHT ANTECUBITAL  Final   Special Requests   Final    BOTTLES DRAWN AEROBIC AND ANAEROBIC Blood Culture adequate volume   Culture   Final    NO GROWTH 3 DAYS Performed at [facility], [address] [place], [place] [address]    Report Status PENDING  Incomplete  Urine culture     Status: None   Collection Time: 05/29/2019  4:02 PM   Specimen: In/Out Cath Urine  Result Value Ref Range Status   Specimen Description   Final    IN/OUT CATH URINE Performed at [facility], [address], [place], [place] [address]    Special Requests   Final    NONE Performed at [facility], [address], [place], [place] [address]    Culture   Final    NO GROWTH Performed at [facility], [place] [address] [place], [place] [address]    Report Status 06/20/2019 FINAL  Final  Blood Culture (routine x 2)     Status: Abnormal   Collection Time:  06/04/2019  4:03 PM   Specimen: BLOOD  Result Value Ref Range Status   Specimen Description   Final    BLOOD LEFT ANTECUBITAL Performed at [facility], [address] [place],  [place] [address]    Special Requests   Final    BOTTLES DRAWN AEROBIC AND ANAEROBIC Blood Culture adequate volume Performed at [facility], [address] [place],  [address]    Culture  Setup Time   Final    GRAM POSITIVE COCCI IN BOTH AEROBIC AND ANAEROBIC BOTTLES CRITICAL RESULT CALLED TO, READ BACK BY AND VERIFIED WITH: Tobie Lords [id number] 06/20/19 HNM Performed at  [place] [place], [place] [address]    Culture (A)  Final    STAPHYLOCOCCUS SPECIES (COAGULASE NEGATIVE) THE SIGNIFICANCE OF ISOLATING THIS ORGANISM FROM A SINGLE SET OF BLOOD CULTURES WHEN MULTIPLE SETS ARE DRAWN IS UNCERTAIN. PLEASE NOTIFY THE MICROBIOLOGY DEPARTMENT WITHIN ONE WEEK IF SPECIATION AND SENSITIVITIES ARE REQUIRED. Performed at [facility], [place] [address], [place], [place] [address]    Report Status 06/22/2019 FINAL  Final         Radiology Studies: No results found.      Scheduled Meds: . ipratropium  2 puff Inhalation Q6H  . pantoprazole (PROTONIX) IV  40 mg Intravenous Q12H  . sodium chloride flush  3 mL Intravenous Q12H  . tamsulosin  0.4 mg Oral Daily   Continuous Infusions:   LOS: 3 days    Time spent: 27minutes    Idonna Heeren A Tashanna Dolin, DO [facility]   If 7PM-7AM, please contact night-coverage [email or web address] Password [email or web address] 06/22/2019, 1:37 PM

## 2019-06-22 NOTE — Plan of Care (Signed)
  Problem: Nutrition: Goal: Adequate nutrition will be maintained Outcome: Progressing   Problem: Pain Managment: Goal: General experience of comfort will improve Outcome: Progressing   Problem: Safety: Goal: Ability to remain free from injury will improve Outcome: Progressing   

## 2019-06-23 DIAGNOSIS — Z515 Encounter for palliative care: Secondary | ICD-10-CM

## 2019-06-24 LAB — CULTURE, BLOOD (ROUTINE X 2)
Culture: NO GROWTH
Special Requests: ADEQUATE

## 2019-07-21 NOTE — Progress Notes (Signed)
Pt pronounced deceased at 10:29 by RN and Dr Arbutus Ped. Pt next of kin notified Teena Dunk, funeral home [employer] and [place], [place] and [place] donor services ( referral number [phone] Tonye Becket)

## 2019-07-21 NOTE — Progress Notes (Signed)
Notified by [facility] Shelda Jakes RN of patient death. Hospital staff have notified patient's sister. No family present at this time. Hospice team notified.  Flo Shanks BSN, [place] [phone]

## 2019-07-21 NOTE — Death Summary Note (Signed)
Death Summary  [name] [id number] DOB: [birth year] DOA: 2019/06/24  PCP: Adin Hector, MD PCP/Office notified: 06/28/19 by secure chat  Admit date: 2019-06-24 Date of Death: 2019-06-28  Final Diagnoses:  Active Problems:   Acute respiratory failure due to COVID-19 ([place])    1. Acute Respiratory Failure due to COVID-19 infection 2. Hypoglycemia 3. Acute kidney injury 4. Type 2 Diabetes 5. Leukopenia, chronic 6. History of AML  History of present illness:  Lorenz Kerstetter Storeyis a 74 y.o.malemalewith a history of AML, diabetes, hypertension, left upper extremity DVT, GERD, epilepsywho was sent to the ED from the [place] facility due to low blood sugar which was found to be 50.PerEMS report,that on their arrival it was 70 something. Patient was noted to have an oxygen saturation of about 85% on room air. He is known to be Covid positive recently. EMS transferred patient on nonrebreather.Patient was known Covid positive at the nursing facility. He was also receiving hospice care.   Hospital Course:  His COVID-19 antigen tested returned positive.  Treated with Solu-Medrol in the ER and remdesivir was started. Per records, family does not want patient hospitalized and wish for only comfort care measures. Palliative care team spoke with patient's sister and HPOA, and she decided to pursue comfort measures only, in keeping with the patient's prior wishes and previous hospice status.  All life-prolonging measures were stopped.  Patient remained on comfort care until he passed away this morning.     Time: 1029  Signed:  Ezekiel Slocumb, DO  [facility] 2019-06-28, 10:33 AM

## 2019-07-21 NOTE — Progress Notes (Signed)
Daily Progress Note   Patient Name: Mike [name]       Date: 07-13-2019 DOB: 1946-02-23  Age: 74 y.o. MRN#: [id number] Attending Physician: Mike Slocumb, DO Primary Care Physician: Mike Hector, MD Admit Date: 06/11/2019  Reason for Consultation/Follow-up: Terminal Care  Subjective: Patient is on covid isolation. Per nursing, he is calm and appears to be in no distress. She states he shakes his head "no" to any kind of food or liquid, and has no oral intake. SW/CM working on hospice facility placement, or facility with hospice to follow placement.   Spoke with sister to update her and discuss possible discharge options and answer questions. As I spoke with her, nurse came to speak with me, and states he has died while I was speaking with Mike [name]. Mike [name] was updated and phone given to nurse for details of his death.   Length of Stay: 4  Current Medications: Scheduled Meds:  . ipratropium  2 puff Inhalation Q6H  . pantoprazole (PROTONIX) IV  40 mg Intravenous Q12H  . sodium chloride flush  3 mL Intravenous Q12H  . tamsulosin  0.4 mg Oral Daily    Continuous Infusions:   PRN Meds: acetaminophen **OR** acetaminophen, antiseptic oral rinse, chlorpheniramine-HYDROcodone, glycopyrrolate **OR** glycopyrrolate **OR** glycopyrrolate, guaiFENesin-dextromethorphan, haloperidol **OR** haloperidol **OR** haloperidol lactate, LORazepam **OR** LORazepam **OR** LORazepam, morphine injection, morphine CONCENTRATE **OR** morphine CONCENTRATE, ondansetron **OR** ondansetron (ZOFRAN) IV, polyvinyl alcohol, sodium chloride flush    Vital Signs: BP 133/74 (BP Location: Right Arm)   Pulse 90   Temp 98.4 F (36.9 C) (Oral)   Resp 19   Ht 5\' 10"  (1.778 m)   Wt 96 kg   SpO2 92%   BMI 30.38  kg/m  SpO2: SpO2: 92 % O2 Device: O2 Device: Room Air O2 Flow Rate: O2 Flow Rate (L/min): 1 L/min  Intake/output summary:   Intake/Output Summary (Last 24 hours) at Jul 13, 2019 1005 Last data filed at Jul 13, 2019 0750 Gross per 24 hour  Intake --  Output 0 ml  Net 0 ml   LBM: Last BM Date: (PTA) Baseline Weight: Weight: 104.3 kg Most recent weight: Weight: 96 kg       Palliative Assessment/Data:      Patient Active Problem List   Diagnosis Date Noted  . Acute respiratory failure due to COVID-19 (  [place]) 06/12/2019  . Goals of care, counseling/discussion 06/02/2019  . AML (acute myeloblastic leukemia) ([place]) 04/12/2019  . Major depressive disorder, recurrent severe without psychotic features ([place]) 04/12/2019  . Acute deep vein thrombosis (DVT) of upper extremity ([place]) 04/10/2019  . Pressure injury of skin 04/05/2019  . Sepsis ([place]) 03/27/2019  . AKI (acute kidney injury) ([place]) 07/24/2018  . 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 ([place]) 06/13/2018  . Basal cell carcinoma 03/27/2016  . Allergic conjunctivitis of both eyes 01/19/2016  . Acute left-sided thoracic back pain 09/25/2015  . Chest pain 09/24/2015  . Chronic cough 03/30/2015  . CAD (coronary artery disease), native coronary artery 10/19/2003    Palliative Care Assessment & Plan    Recommendations/Plan:  Patient died while speaking on phone to sister Mike [name].     Code Status:    Code Status Orders  (From admission, onward)         Start     Ordered   06/20/19 1231  Do not attempt resuscitation (DNR)  Continuous    Question Answer Comment  In the event of cardiac or respiratory ARREST Do not call a "code blue"   In the event of cardiac or respiratory ARREST Do not perform Intubation, CPR, defibrillation or ACLS   In the event of cardiac or respiratory ARREST Use medication by any route, position, wound care, and other measures to relive pain and suffering. May use oxygen, suction and manual treatment of airway  obstruction as needed for comfort.   Comments MOST form in [place]      06/20/19 1233        Code Status History    Date Active Date Inactive Code Status Order ID Comments User Context   05/23/2019 1751 06/20/2019 1233 DNR [id number]  Thornell Mule, MD ED   04/05/2019 1718 04/09/2019 1815 DNR [id number]  Cletis [place], RN Inpatient   03/27/2019 0003 04/05/2019 1718 Full Code [id number]  Lang Snow, NP ED   07/24/2018 1742 07/25/2018 1719 Full Code [id number]  Nicholes Mango, MD ED   06/13/2018 1225 06/17/2018 1654 Full Code [id number]  Epifanio Lesches, MD Inpatient   Advance Care Planning Activity           Care plan was discussed with RN  Thank you for allowing the Palliative Medicine Team to assist in the care of this patient.   Time In: 9:45 Time Out: 10:37 Total Time 52 min Prolonged Time Billed  no      Greater than 50%  of this time was spent counseling and coordinating care related to the above assessment and plan.  Asencion Gowda, NP  Please contact Palliative Medicine Team phone at [phone] for questions and concerns.

## 2019-07-21 DEATH — deceased

## 2020-01-07 IMAGING — CT CT HEAD W/O CM
3 series · 15 of 47 positions shown, 18 images · non-contrast
Comparison: February 25, 2019

CLINICAL DATA: Head trauma, unwitnessed fall

EXAM:
CT HEAD WITHOUT CONTRAST
TECHNIQUE: Contiguous axial images were obtained from the base of the skull
through the vertex without intravenous contrast.

[Series 2: head wo · axial · 0.42mm/px · z∈[+296,+426]mm · 9 of 32 slices shown, 12 images]
[im 3/32  brain]
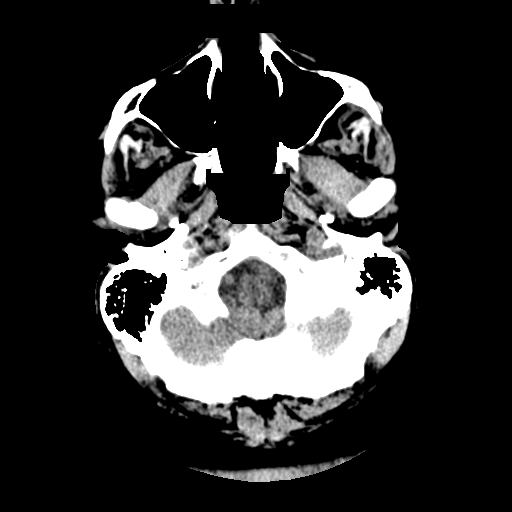
[im 3/32  bone]
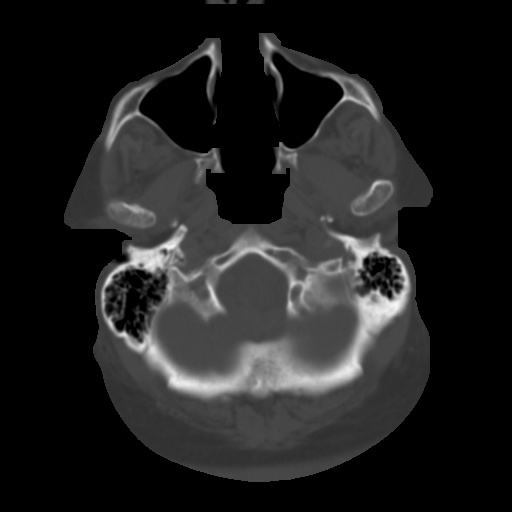
[im 6/32  brain]
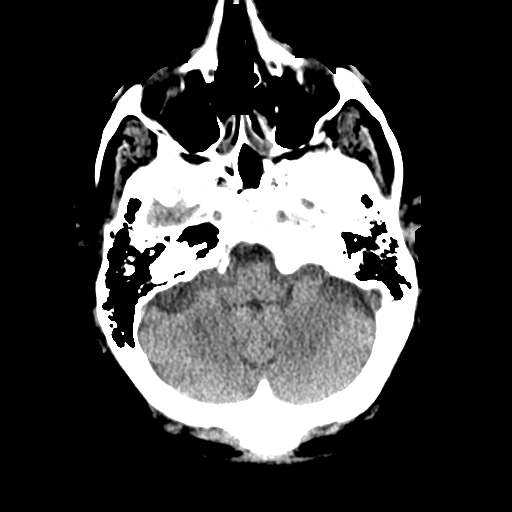
[im 9/32  brain]
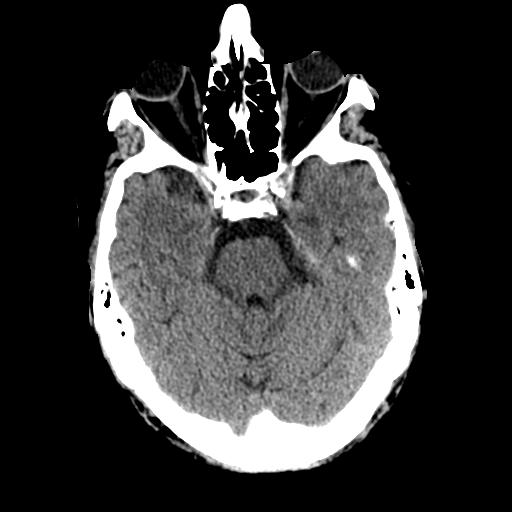
[im 12/32  brain]
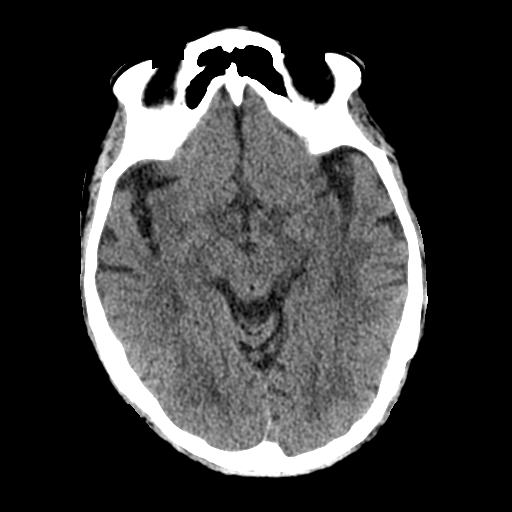
[im 17/32  brain]
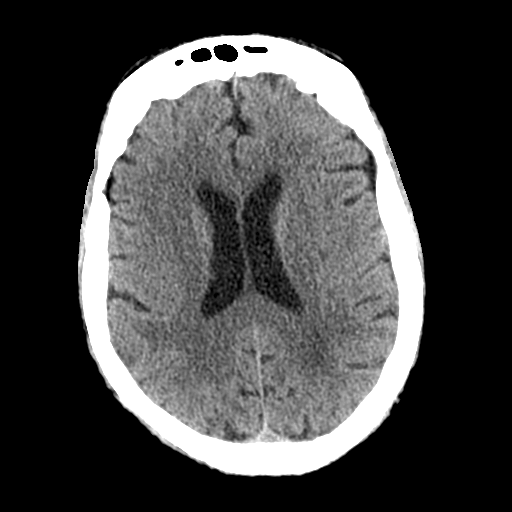
[im 17/32  bone]
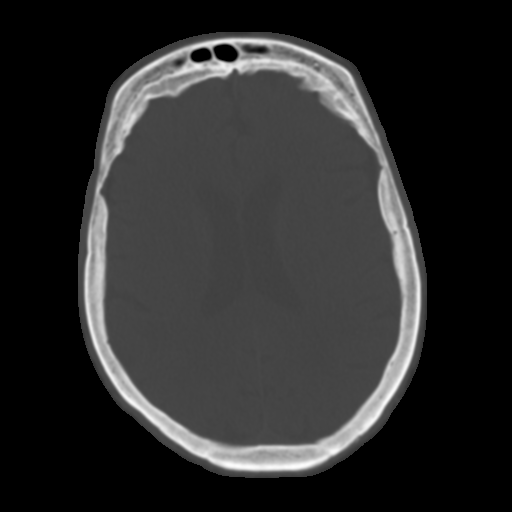
[im 20/32  brain]
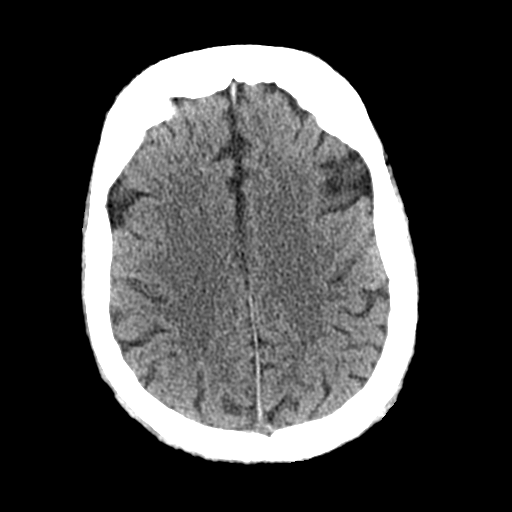
[im 23/32  brain]
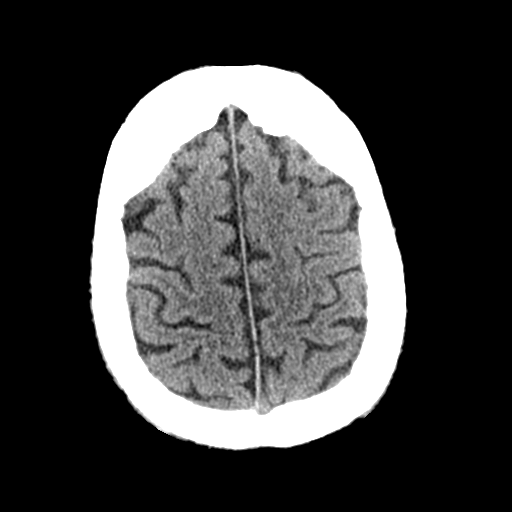
[im 26/32  brain]
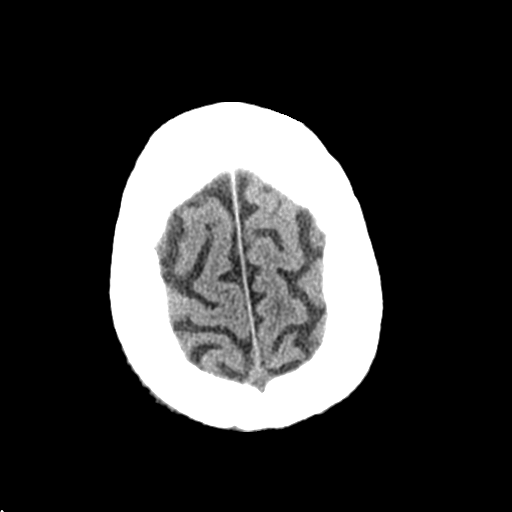
[im 29/32  brain]
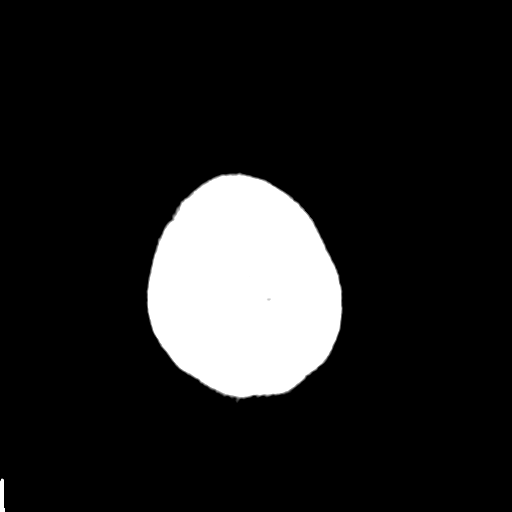
[im 29/32  bone]
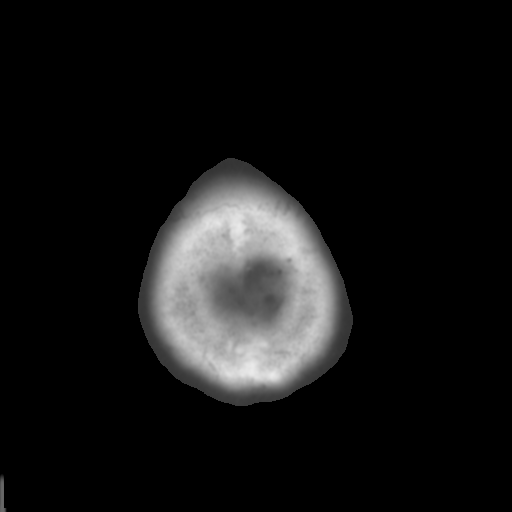

[Series 4: coronal soft tissue · coronal · 0.33mm/px · 3 of 66 slices shown]
[im 22/66  brain]
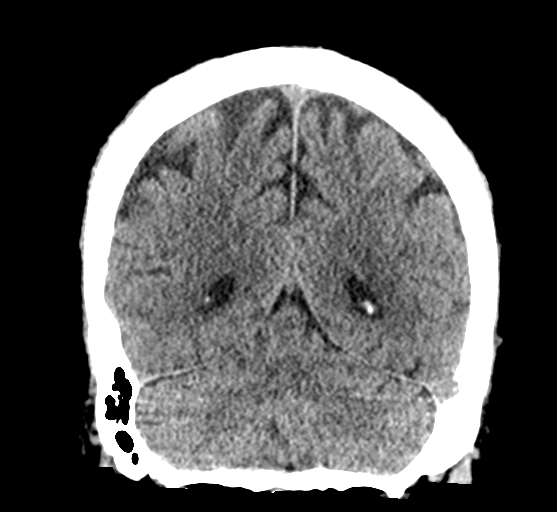
[im 29/66  brain]
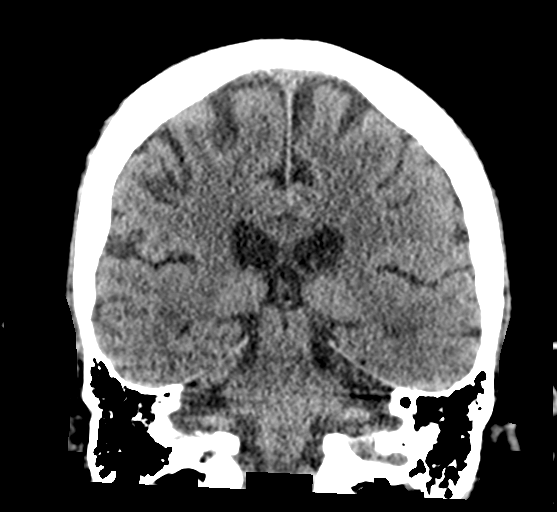
[im 37/66  brain]
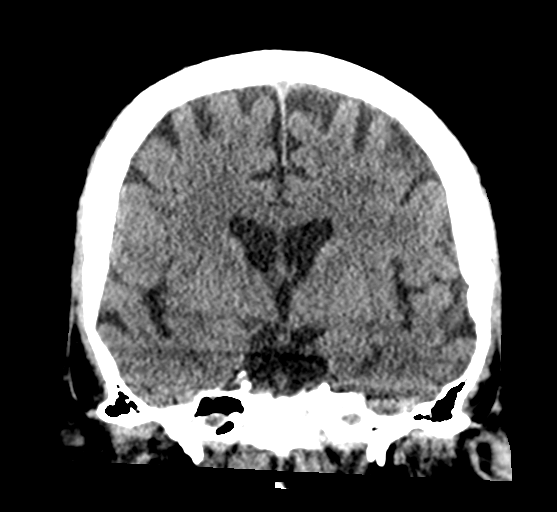

[Series 5: sagittal soft tissue · sagittal · 0.33mm/px · 3 of 53 slices shown]
[im 18/53  brain]
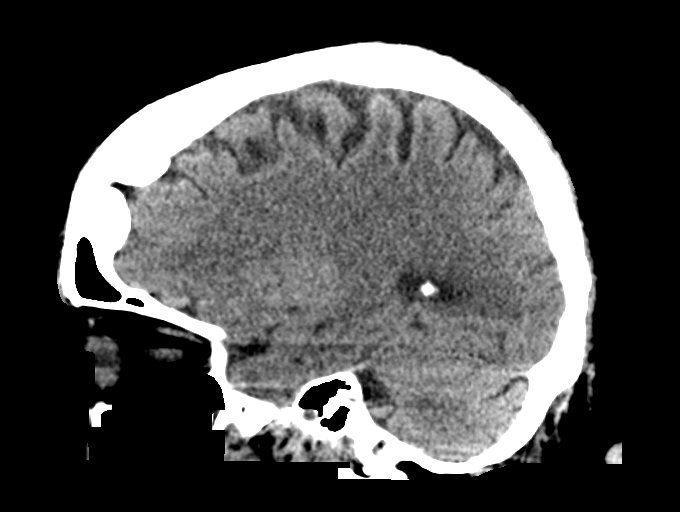
[im 27/53  brain]
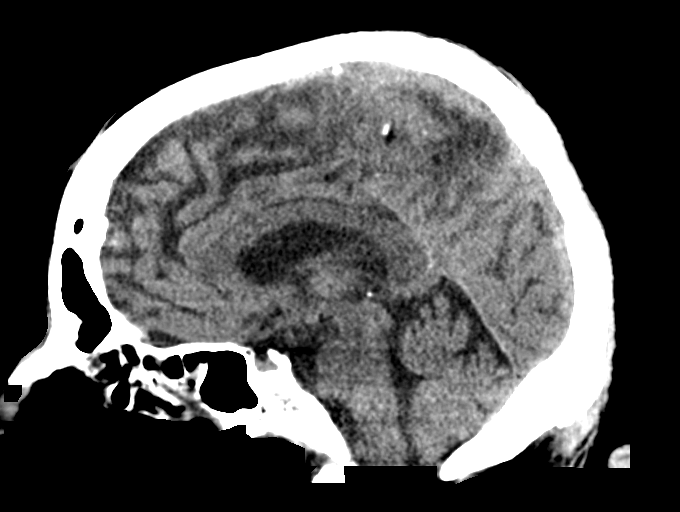
[im 35/53  brain]
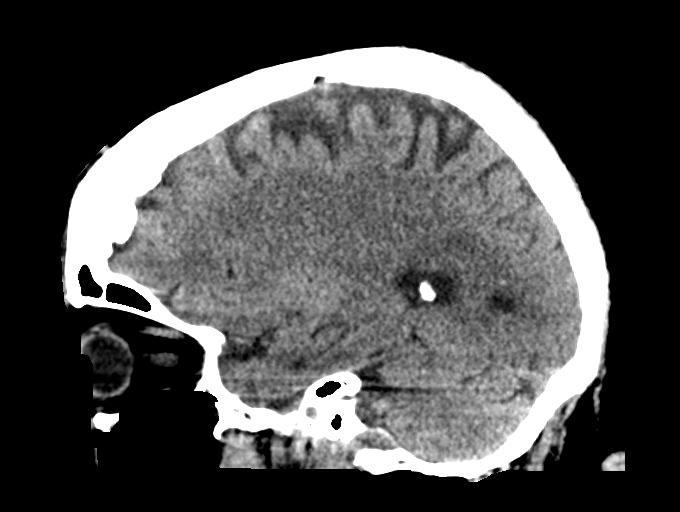

[15 of 47 positions shown; findings below may reference images not displayed]

FINDINGS: Brain: No evidence of acute territorial infarction, hemorrhage,
hydrocephalus,extra-axial collection or mass lesion/mass effect.
There is mild dilatation the ventricles and sulci consistent with
age-related atrophy. Low-attenuation changes in the deep white
matter consistent with small vessel ischemia.

Vascular: No hyperdense vessel or unexpected calcification.

Skull: The skull is intact. No fracture or focal lesion identified.

Sinuses/Orbits: The visualized paranasal sinuses and mastoid air
cells are clear. The orbits and globes intact.

Other: None
IMPRESSION: No acute intracranial abnormality.

Findings consistent with age related atrophy and chronic small
vessel ischemia

## 2020-01-09 IMAGING — CR DG PELVIS 1-2V
1 series · 1 of 1 positions shown · non-contrast
Comparison: Pelvis radiographs 03/23/2010

CLINICAL DATA: Recent fall at home. Pt does not remember falling.
No known previous injury or surgery to pelvis or lumbar spine.

EXAM:
PELVIS - 1-2 VIEW

[dg pelvis 1-2 views]
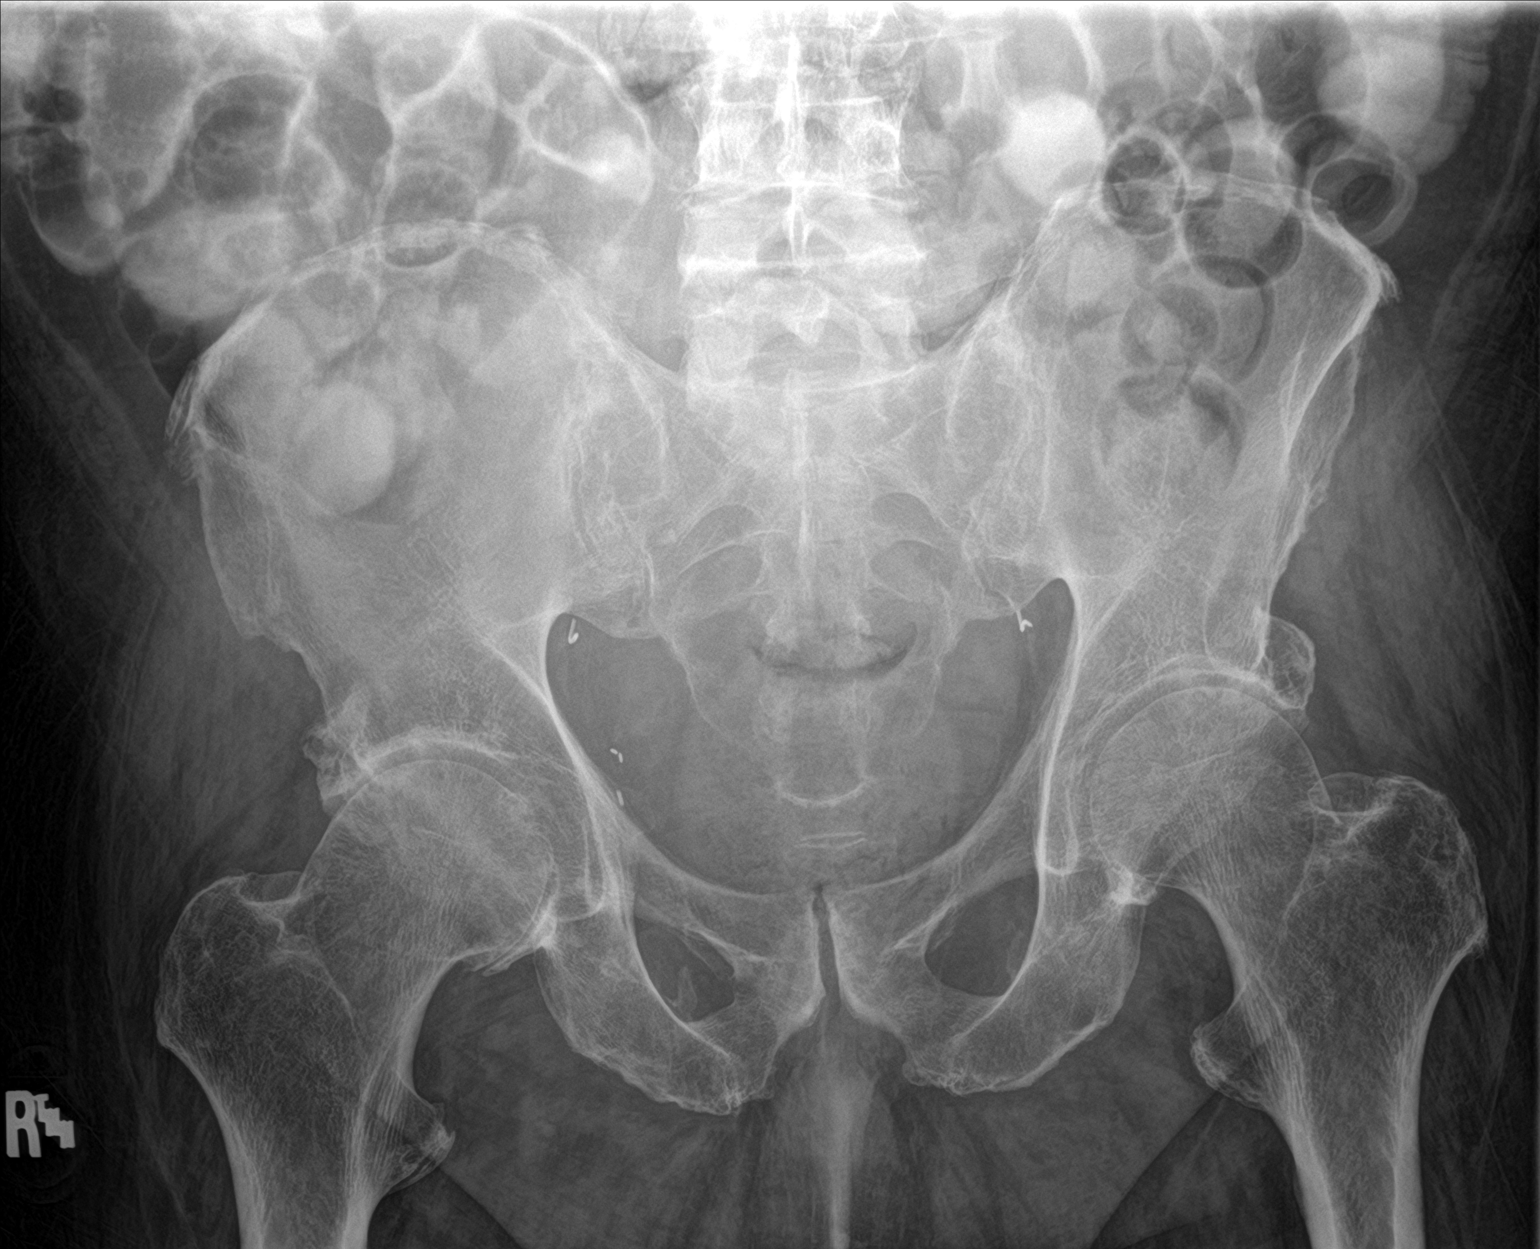

[1 of 1 positions shown; findings below may reference images not displayed]

FINDINGS: There is no evidence of pelvic fracture or diastasis. Hips appear
normally aligned. No focal bony lesion. Bilateral postoperative
clips. No pelvic bone lesions are seen. Regional soft tissues
unremarkable.
IMPRESSION: Negative pelvis radiographs.

## 2020-01-09 IMAGING — CR DG LUMBAR SPINE 2-3V
1 series · 3 of 3 positions shown · non-contrast
Comparison: None.

CLINICAL DATA: Recent fall at home. Pt does not remember falling.
No known previous injury or surgery to pelvis or lumbar spine.

EXAM:
LUMBAR SPINE - 2-3 VIEW

[Series 1: dg lumbar spine 2-3 views · 0.14mm/px · 3 of 3 slices shown]
[im 1/3]
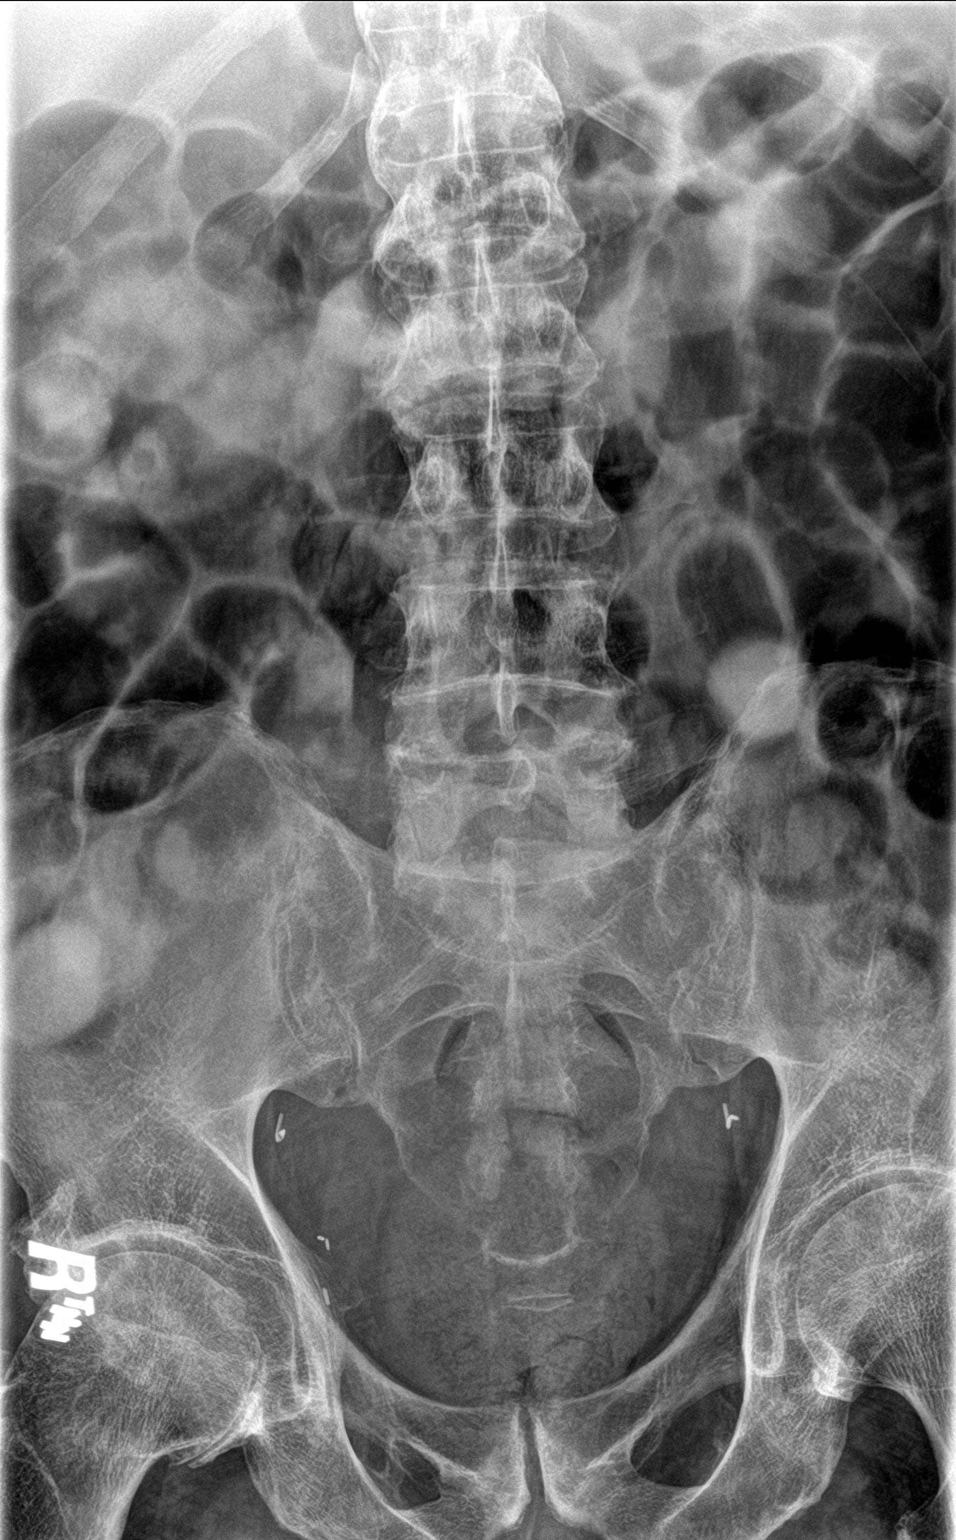
[im 2/3]
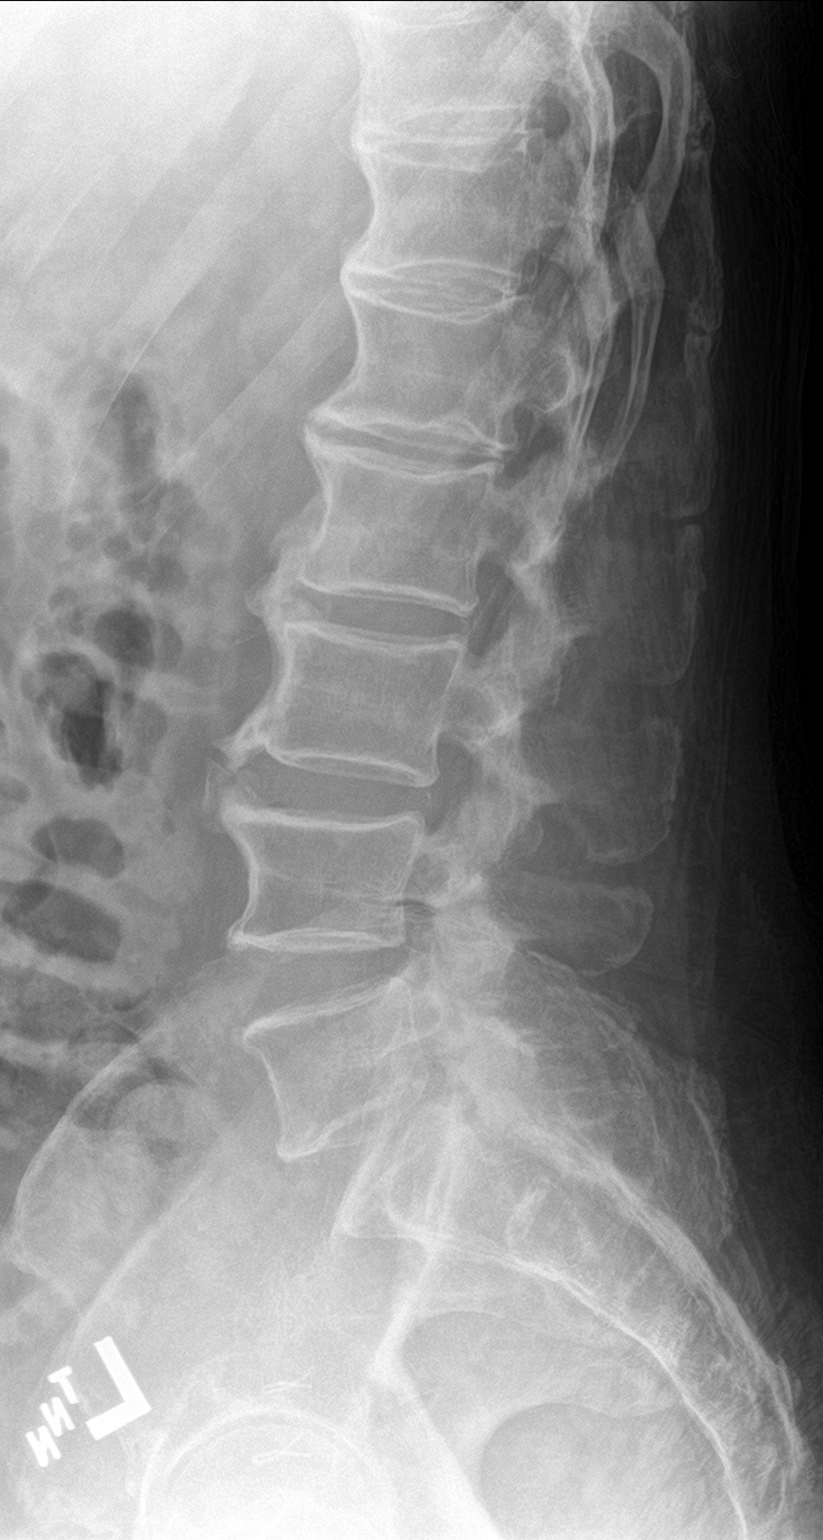
[im 3/3]
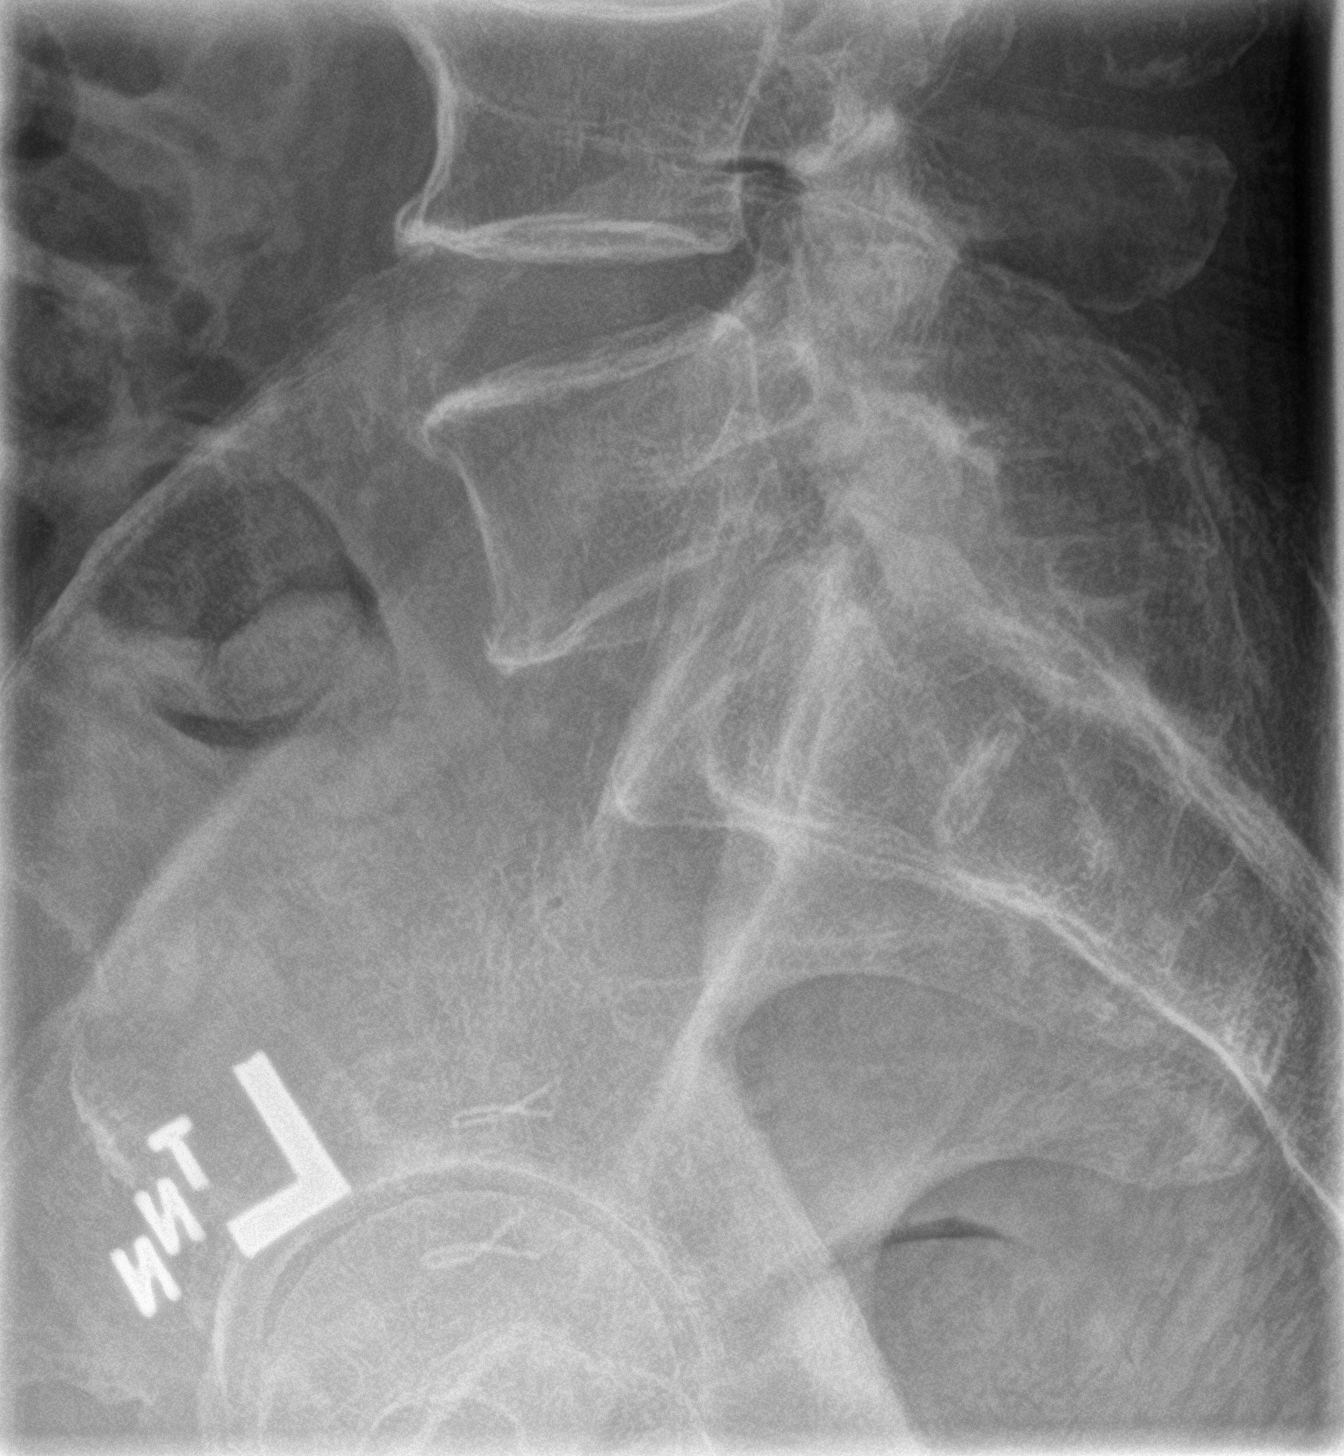

[3 of 3 positions shown; findings below may reference images not displayed]

FINDINGS: There is no evidence of lumbar spine fracture. Alignment is normal.
Intervertebral disc spaces are maintained. Flowing anterior
osteophytosis. Gaseous distension of bowel loops throughout the
abdomen. No acute finding in the visualized pelvis. SI joints are
open.
IMPRESSION: No acute osseous abnormality in the lumbar spine
